# Patient Record
Sex: Female | Born: 1970
Health system: Southern US, Community
[De-identification: ages and names within clinical notes are randomized; demographics above are authoritative.]

## PROBLEM LIST (undated history)

## (undated) DIAGNOSIS — M329 Systemic lupus erythematosus, unspecified: Secondary | ICD-10-CM

## (undated) DIAGNOSIS — E079 Disorder of thyroid, unspecified: Secondary | ICD-10-CM

## (undated) DIAGNOSIS — I10 Essential (primary) hypertension: Secondary | ICD-10-CM

## (undated) DIAGNOSIS — IMO0002 Reserved for concepts with insufficient information to code with codable children: Secondary | ICD-10-CM

## (undated) HISTORY — PX: ABDOMINAL HYSTERECTOMY: SHX81

---

## 2006-12-02 ENCOUNTER — Emergency Department (HOSPITAL_COMMUNITY): Admission: EM | Admit: 2006-12-02 | Discharge: 2006-12-02 | Payer: Self-pay | Admitting: Emergency Medicine

## 2007-05-17 ENCOUNTER — Emergency Department (HOSPITAL_COMMUNITY): Admission: EM | Admit: 2007-05-17 | Discharge: 2007-05-17 | Payer: Self-pay | Admitting: Emergency Medicine

## 2007-05-24 ENCOUNTER — Inpatient Hospital Stay (HOSPITAL_COMMUNITY): Admission: AD | Admit: 2007-05-24 | Discharge: 2007-05-24 | Payer: Self-pay | Admitting: Obstetrics & Gynecology

## 2010-10-20 LAB — URINALYSIS, ROUTINE W REFLEX MICROSCOPIC
Bilirubin Urine: NEGATIVE
Glucose, UA: NEGATIVE
Ketones, ur: NEGATIVE
Nitrite: NEGATIVE
Protein, ur: NEGATIVE
Specific Gravity, Urine: 1.01
Urobilinogen, UA: 0.2
pH: 7

## 2010-10-20 LAB — URINE MICROSCOPIC-ADD ON

## 2010-10-20 LAB — CULTURE, ROUTINE-ABSCESS

## 2010-10-20 LAB — POCT PREGNANCY, URINE
Operator id: 202651
Preg Test, Ur: NEGATIVE

## 2010-11-03 LAB — I-STAT 8, (EC8 V) (CONVERTED LAB)
BUN: 10
Bicarbonate: 25.4 — ABNORMAL HIGH
Chloride: 106
Glucose, Bld: 83
HCT: 42
Hemoglobin: 14.3
Operator id: 116391
Potassium: 4.3
Sodium: 140
TCO2: 27
pCO2, Ven: 44.4 — ABNORMAL LOW
pH, Ven: 7.366 — ABNORMAL HIGH

## 2010-11-03 LAB — POCT URINALYSIS DIP (DEVICE)
Glucose, UA: NEGATIVE
Nitrite: NEGATIVE
Operator id: 116391
Protein, ur: 30 — AB
Specific Gravity, Urine: 1.015
Urobilinogen, UA: 2 — ABNORMAL HIGH
pH: 6

## 2010-11-03 LAB — POCT I-STAT CREATININE
Creatinine, Ser: 0.9
Operator id: 116391

## 2010-11-03 LAB — POCT PREGNANCY, URINE
Operator id: 116391
Preg Test, Ur: NEGATIVE

## 2010-11-03 LAB — TSH: TSH: 1.121

## 2014-11-11 DIAGNOSIS — Z8041 Family history of malignant neoplasm of ovary: Secondary | ICD-10-CM | POA: Insufficient documentation

## 2014-11-11 DIAGNOSIS — N946 Dysmenorrhea, unspecified: Secondary | ICD-10-CM | POA: Insufficient documentation

## 2014-11-28 DIAGNOSIS — Z87891 Personal history of nicotine dependence: Secondary | ICD-10-CM | POA: Insufficient documentation

## 2014-11-28 DIAGNOSIS — Z01811 Encounter for preprocedural respiratory examination: Secondary | ICD-10-CM | POA: Insufficient documentation

## 2015-04-03 DIAGNOSIS — G588 Other specified mononeuropathies: Secondary | ICD-10-CM | POA: Insufficient documentation

## 2015-04-03 DIAGNOSIS — M722 Plantar fascial fibromatosis: Secondary | ICD-10-CM | POA: Insufficient documentation

## 2015-11-27 DIAGNOSIS — M5416 Radiculopathy, lumbar region: Secondary | ICD-10-CM | POA: Insufficient documentation

## 2016-04-09 DIAGNOSIS — M3219 Other organ or system involvement in systemic lupus erythematosus: Secondary | ICD-10-CM | POA: Insufficient documentation

## 2016-08-27 ENCOUNTER — Encounter (HOSPITAL_COMMUNITY): Payer: Self-pay | Admitting: Family Medicine

## 2016-08-27 ENCOUNTER — Ambulatory Visit (HOSPITAL_COMMUNITY)
Admission: EM | Admit: 2016-08-27 | Discharge: 2016-08-27 | Disposition: A | Discharge status: Home / Self Care | Payer: Managed Care, Other (non HMO) | Attending: Emergency Medicine | Admitting: Emergency Medicine

## 2016-08-27 DIAGNOSIS — J0100 Acute maxillary sinusitis, unspecified: Secondary | ICD-10-CM | POA: Diagnosis not present

## 2016-08-27 HISTORY — DX: Reserved for concepts with insufficient information to code with codable children: IMO0002

## 2016-08-27 HISTORY — DX: Essential (primary) hypertension: I10

## 2016-08-27 HISTORY — DX: Systemic lupus erythematosus, unspecified: M32.9

## 2016-08-27 MED ORDER — AMOXICILLIN-POT CLAVULANATE 875-125 MG PO TABS: 1.0000 | ORAL_TABLET | Freq: Two times a day (BID) | ORAL | 0 refills | Status: AC

## 2016-08-27 MED ORDER — LIDOCAINE VISCOUS 2 % MT SOLN: 15.0000 mL | OROMUCOSAL | 0 refills | Status: DC | PRN

## 2016-08-27 NOTE — ED Triage Notes (Signed)
Pt here for sore throat and drainage in the back of her throat. sts x 2 days. sts recent cough.cold.

## 2016-08-27 NOTE — Discharge Instructions (Signed)
Take antibiotics as directed. Lidocaine solution for sore throat. Avoid oral intake for 30 minutes after solution, as it can medication gag reflex. Take over-the-counter Flonase/decongestants to help with nasal congestion. Keep hydrated. Monitor for worsening of symptoms, follow up for reevaluation is needed.

## 2016-08-27 NOTE — ED Provider Notes (Signed)
CSN: [id number]     Arrival date & time 08/27/16  1127 History   None    Chief Complaint  Patient presents with  . Sore Throat   (Consider location/radiation/quality/duration/timing/severity/associated sxs/prior Treatment) 46 year old female with history of hypertension and lupus comes in for 2 day history of sore throat and painful swallowing. Patient states she had symptoms of cold starting a month ago, with nasal congestion and sinus pressure/pain. She had taken over-the-counter medications with some relief. 2 days ago, she started having a sore throat and states it is very painful to swallow, and extends down her neck. Denies fever, chills, night sweats. Denies chest pain, trouble breathing, swelling of the throat. Denies ear pain, eye pain. Denies abdominal pain, nausea, vomiting, diarrhea. She recently moved back to [place], is in the process of finding a PCP.      Past Medical History:  Diagnosis Date  . Hypertension   . Lupus    History reviewed. No pertinent surgical history. History reviewed. No pertinent family history. Social History  Substance Use Topics  . Smoking status: Not on file  . Smokeless tobacco: Not on file  . Alcohol use Not on file   OB History    No data available     Review of Systems  Reason unable to perform ROS: See HPI as above.    Allergies  Patient has no known allergies.  Home Medications   Prior to Admission medications   Medication Sig Start Date End Date Taking? Authorizing Provider  atenolol (TENORMIN) 100 MG tablet Take 100 mg by mouth daily.   Yes [provider]  folic acid (FOLVITE) 1 MG tablet Take 3 mg by mouth daily.   Yes [provider]  hydroxychloroquine (PLAQUENIL) 200 MG tablet Take 400 mg by mouth daily.   Yes [provider]  Ixekizumab 80 MG/ML SOAJ Inject 80 mg into the skin every 14 (fourteen) days.   Yes [provider]  lisinopril (PRINIVIL,ZESTRIL) 10 MG tablet Take 10 mg by  mouth daily.   Yes [provider]  nebivolol (BYSTOLIC) 10 MG tablet Take 10 mg by mouth daily.   Yes [provider]  predniSONE (DELTASONE) 5 MG tablet Take 5 mg by mouth 3 (three) times daily.   Yes [provider]  pregabalin (LYRICA) 100 MG capsule Take 300 mg by mouth at bedtime as needed.   Yes [provider]  amoxicillin-clavulanate (AUGMENTIN) 875-125 MG tablet Take 1 tablet by mouth every 12 (twelve) hours. 08/27/16 09/06/16  Cathie Hoops, Esaul Dorwart V, PA-C  lidocaine (XYLOCAINE) 2 % solution Use as directed 15 mLs in the mouth or throat as needed for mouth pain. 08/27/16   Belinda Fisher, PA-C   Meds Ordered and Administered this Visit  Medications - No data to display  BP (!) 158/107   Pulse 98   Temp 98.4 F (36.9 C)   Resp 18   SpO2 100%  No data found.   Physical Exam  Constitutional: She is oriented to person, place, and time. She appears well-developed and well-nourished. No distress.  HENT:  Head: Normocephalic and atraumatic.  Right Ear: Tympanic membrane, external ear and ear canal normal. Tympanic membrane is not erythematous and not bulging.  Left Ear: Tympanic membrane, external ear and ear canal normal. Tympanic membrane is not erythematous and not bulging.  Nose: Right sinus exhibits maxillary sinus tenderness. Right sinus exhibits no frontal sinus tenderness. Left sinus exhibits maxillary sinus tenderness. Left sinus exhibits no frontal sinus tenderness.  Mouth/Throat: Uvula is midline and mucous membranes are normal. Posterior oropharyngeal erythema present. No oropharyngeal exudate or posterior oropharyngeal edema. Tonsils are 1+ on the right. Tonsils are 1+ on the left. No tonsillar exudate.  Eyes: Pupils are equal, round, and reactive to light. Conjunctivae are normal.  Neck: Normal range of motion. Neck supple.  Cardiovascular: Normal rate, regular rhythm and normal heart sounds.  Exam reveals no gallop and no friction rub.   No murmur  heard. Pulmonary/Chest: Effort normal and breath sounds normal. She has no decreased breath sounds. She has no wheezes. She has no rhonchi. She has no rales.  Lymphadenopathy:    She has no cervical adenopathy.  Neurological: She is alert and oriented to person, place, and time.  Skin: Skin is warm and dry.  Psychiatric: She has a normal mood and affect. Her behavior is normal. Judgment normal.    Urgent Care Course     Procedures (including critical care time)  Labs Review Labs Reviewed - No data to display  Imaging Review No results found.       MDM   1. Acute non-recurrent maxillary sinusitis    Discussed with patient history and exam most consistent with sinusitis. Given patient without exudates seen on exam, no fever, deferred rapid strep today. Given patient with sinus pressure/pain for 3-4 weeks, will treat with Augmentin. Lidocaine solution for sore throat, precautions given. Patient to continue over-the-counter nasal spray, decongestions to help with nasal congestion. Push fluids. Monitor for worsening of symptoms, trouble breathing, trouble swallowing, swelling of the throat, to go to the ED for further evaluation.    Belinda Fisher, PA-C 08/27/16 1429

## 2016-09-22 ENCOUNTER — Ambulatory Visit: Payer: Managed Care, Other (non HMO) | Admitting: Physician Assistant

## 2016-09-24 ENCOUNTER — Ambulatory Visit: Payer: Managed Care, Other (non HMO) | Admitting: Physician Assistant

## 2016-09-29 ENCOUNTER — Other Ambulatory Visit: Payer: Self-pay | Admitting: Rheumatology

## 2016-09-29 DIAGNOSIS — E049 Nontoxic goiter, unspecified: Secondary | ICD-10-CM

## 2016-10-07 ENCOUNTER — Ambulatory Visit
Admission: RE | Admit: 2016-10-07 | Discharge: 2016-10-07 | Disposition: A | Discharge status: Home / Self Care | Payer: Managed Care, Other (non HMO) | Source: Ambulatory Visit | Attending: Rheumatology | Admitting: Rheumatology

## 2016-10-07 DIAGNOSIS — E049 Nontoxic goiter, unspecified: Secondary | ICD-10-CM

## 2016-10-08 ENCOUNTER — Other Ambulatory Visit: Payer: Managed Care, Other (non HMO)

## 2016-10-12 ENCOUNTER — Other Ambulatory Visit: Payer: Self-pay | Admitting: Rheumatology

## 2016-10-12 DIAGNOSIS — E041 Nontoxic single thyroid nodule: Secondary | ICD-10-CM

## 2016-10-19 ENCOUNTER — Emergency Department (HOSPITAL_COMMUNITY)
Admission: EM | Admit: 2016-10-19 | Discharge: 2016-10-19 | Disposition: A | Discharge status: Home / Self Care | Payer: Managed Care, Other (non HMO) | Attending: Emergency Medicine | Admitting: Emergency Medicine

## 2016-10-19 ENCOUNTER — Encounter: Payer: Self-pay | Admitting: Emergency Medicine

## 2016-10-19 DIAGNOSIS — Z5321 Procedure and treatment not carried out due to patient leaving prior to being seen by health care provider: Secondary | ICD-10-CM | POA: Diagnosis not present

## 2016-10-19 DIAGNOSIS — J029 Acute pharyngitis, unspecified: Secondary | ICD-10-CM | POA: Diagnosis present

## 2016-10-19 LAB — BASIC METABOLIC PANEL
Anion gap: 6 (ref 5–15)
BUN: 10 mg/dL (ref 6–20)
CO2: 28 mmol/L (ref 22–32)
Calcium: 8.8 mg/dL — ABNORMAL LOW (ref 8.9–10.3)
Chloride: 106 mmol/L (ref 101–111)
Creatinine, Ser: 0.92 mg/dL (ref 0.44–1.00)
GFR calc Af Amer: >60 mL/min (ref >60)
GFR calc non Af Amer: >60 mL/min (ref >60)
Glucose, Bld: 122 mg/dL — ABNORMAL HIGH (ref 65–99)
Potassium: 2.9 mmol/L — ABNORMAL LOW (ref 3.5–5.1)
Sodium: 140 mmol/L (ref 135–145)

## 2016-10-19 LAB — CBC
HCT: 38.4 % (ref 36.0–46.0)
Hemoglobin: 12.5 g/dL (ref 12.0–15.0)
MCH: 27.7 pg (ref 26.0–34.0)
MCHC: 32.6 g/dL (ref 30.0–36.0)
MCV: 85 fL (ref 78.0–100.0)
Platelets: 217 10*3/uL (ref 150–400)
RBC: 4.52 MIL/uL (ref 3.87–5.11)
RDW: 15.4 % (ref 11.5–15.5)
WBC: 11.3 10*3/uL — ABNORMAL HIGH (ref 4.0–10.5)

## 2016-10-19 NOTE — ED Notes (Signed)
Pt came to nurses station stating she did not want to wait. Informed pt of the longest wait. Pt continued to state she did not want to wait.

## 2016-10-19 NOTE — ED Triage Notes (Signed)
Pt reports known thyroid nodule for the last 2 years. Pt states over the weekend began having throat pain, difficulty swallowing. Pt awake, alert, appropriate. Pt states she has an appt with the surgeon next week. Here for pain control.

## 2016-10-20 ENCOUNTER — Inpatient Hospital Stay (HOSPITAL_COMMUNITY)
Admission: AD | Admit: 2016-10-20 | Discharge: 2016-10-20 | Disposition: A | Discharge status: Home / Self Care | Payer: Managed Care, Other (non HMO) | Source: Ambulatory Visit | Attending: Obstetrics & Gynecology | Admitting: Obstetrics & Gynecology

## 2016-10-20 DIAGNOSIS — E041 Nontoxic single thyroid nodule: Secondary | ICD-10-CM | POA: Diagnosis present

## 2016-10-20 DIAGNOSIS — R1310 Dysphagia, unspecified: Secondary | ICD-10-CM | POA: Insufficient documentation

## 2016-10-20 NOTE — MAU Provider Note (Signed)
Chief Complaint: Thyroid Nodule   SUBJECTIVE HPI: Denise Carter is a 46 y.o. who presents to MAU with complaints about her thyroid. Patient states that she feels like her thyroid nodules are getting bigger and causing her to have trouble swallowing and drinking. She follows with an endocrinologist. She tried calling the office today but no one called back. States that she has a visit with the surgeon on Monday but felt like she couldn't wait until then. Patient denies fevers, chills, dyspnea, chest pain. She is currently on a beta-blocker and steroid for her symptoms. Denies any gynecological concerns. Denies being pregnant.   Past Medical History:  Diagnosis Date  . Hypertension   . Lupus    OB History  No data available   No past surgical history on file. Social History   Social History  . Marital status: Married    Spouse name: N/A  . Number of children: N/A  . Years of education: N/A   Occupational History  . Not on file.   Social History Main Topics  . Smoking status: Not on file  . Smokeless tobacco: Not on file  . Alcohol use Not on file  . Drug use: Unknown  . Sexual activity: Not on file   Other Topics Concern  . Not on file   Social History Narrative  . No narrative on file   No current facility-administered medications on file prior to encounter.    Current Outpatient Prescriptions on File Prior to Encounter  Medication Sig Dispense Refill  . atenolol (TENORMIN) 100 MG tablet Take 100 mg by mouth daily.    . folic acid (FOLVITE) 1 MG tablet Take 3 mg by mouth daily.    . hydroxychloroquine (PLAQUENIL) 200 MG tablet Take 400 mg by mouth daily.    . Ixekizumab 80 MG/ML SOAJ Inject 80 mg into the skin every 14 (fourteen) days.    Marland Kitchen lidocaine (XYLOCAINE) 2 % solution Use as directed 15 mLs in the mouth or throat as needed for mouth pain. 100 mL 0  . lisinopril (PRINIVIL,ZESTRIL) 10 MG tablet Take 10 mg by mouth daily.    . nebivolol (BYSTOLIC) 10 MG  tablet Take 10 mg by mouth daily.    . predniSONE (DELTASONE) 5 MG tablet Take 5 mg by mouth 3 (three) times daily.    . pregabalin (LYRICA) 100 MG capsule Take 300 mg by mouth at bedtime as needed.     No Known Allergies  I have reviewed the past Medical Hx, Surgical Hx, Social Hx, Allergies and Medications.   REVIEW OF SYSTEMS All systems reviewed and are negative for acute change except as noted in the HPI.   OBJECTIVE BP (!) 159/99 (BP Location: Right Arm)   Pulse (!) 108   Temp (!) 103.1 F (39.5 C) (Oral)   Resp 18   Ht 5\' 4"  (1.626 m)   Wt 71.6 kg (157 lb 12 oz)   SpO2 100%   BMI 27.08 kg/m    PHYSICAL EXAM Constitutional: Well-developed, well-nourished female in no acute distress.  HEENT: [place]/AT, o/p clear no obstruction visualized, EOMI, neck is supple, normal ROM, small palpable 1cm nodule on left side, no adenopathy Cardiovascular: normal rhythm, tachycardia, pulses intact Respiratory: normal rate and effort.  GI: Abd soft, non-tender, non-distended. MS: Extremities nontender, no edema, normal ROM Neurologic: Alert and oriented x 4. No focal deficits   LAB RESULTS No results found for this or any previous visit (from the past 24 hour(s)).  IMAGING  US Thyroid  Result Date: 10/07/2016 CLINICAL DATA:  Thyromegaly on exam EXAM: THYROID ULTRASOUND TECHNIQUE: Ultrasound examination of the thyroid gland and adjacent soft tissues was performed. COMPARISON:  None. FINDINGS: Parenchymal Echotexture: Normal Isthmus: 2 mm Right lobe: 4.7 x 1.5 x 2.4 cm Left lobe: 6.5 x 1.7 x 2.6 cm _________________________________________________________ Estimated total number of nodules >/= 1 cm: 1 Number of spongiform nodules >/=  2 cm not described below (TR1): 0 Number of mixed cystic and solid nodules >/= 1.5 cm not described below (TR2): 0 _________________________________________________________ Nodule # 1: Location: Left; Inferior Maximum size: 1.5 cm; Other 2 dimensions: 1.5 x 1.2 cm  Composition: solid/almost completely solid (2) Echogenicity: isoechoic (1) Shape: not taller-than-wide (0) Margins: ill-defined (0) Echogenic foci: none (0) ACR TI-RADS total points: 3. ACR TI-RADS risk category: TR3 (3 points). ACR TI-RADS recommendations: *Given size (>/= 1.5 - 2.4 cm) and appearance, a follow-up ultrasound in 1 year should be considered based on TI-RADS criteria. _________________________________________________________ Additional bilateral cystic nodules noted which are not fully characterized by TI RADS criteria. These would not meet criteria for any follow-up imaging or biopsy. No adenopathy. IMPRESSION: 1.5 cm left inferior TR 3 nodule meets criteria for follow-up in 1 year. The above is in keeping with the ACR TI-RADS recommendations - J Am Coll Radiol 2017;14:587-595. Electronically Signed   By: Judie Petit.  Shick M.D.   On: 10/07/2016 15:32    MAU COURSE Vitals and nursing notes reviewed PE unremarkable no signs of obstruction  MDM Plan of care reviewed with patient, including labs and tests ordered and medical treatment.   ASSESSMENT 1. Dysphagia, unspecified type   2. Thyroid nodule     PLAN Discharge home in stable condition Patient encouraged to follow-up with endocrinologist and keep surgical appointment Discussed purpose of MAU  Counseled on return precautions and to seek help at one of the EDs Handout given   Caryl Ada, DO OB Fellow Faculty [facility], [facility] 10/20/2016, 10:12 PM

## 2016-10-20 NOTE — Discharge Instructions (Signed)
Follow-up with your surgeon and endocrinologist  Thyroid Nodule A thyroid nodule is an isolatedgrowth of thyroid cells that forms a lump in your thyroid gland. The thyroid gland is a butterfly-shaped gland. It is found in the lower front of your neck. This gland sends chemical messengers (hormones) through your blood to all parts of your body. These hormones are important in regulating your body temperature and helping your body to use energy. Thyroid nodules are common. Most are not cancerous (are benign). You may have one nodule or several nodules. Different types of thyroid nodules include:  Nodules that grow and fill with fluid (thyroid cysts).  Nodules that produce too much thyroid hormone (hot nodules or hyperthyroid).  Nodules that produce no thyroid hormone (cold nodules or hypothyroid).  Nodules that form from cancer cells (thyroid cancers).  What are the causes? Usually, the cause of this condition is not known. What increases the risk? Factors that make this condition more likely to develop include:  Increasing age. Thyroid nodules become more common in people who are older than 47 years of age.  Gender. ? Benign thyroid nodules are more common in women. ? Cancerous (malignant) thyroid nodules are more common in men.  A family history that includes: ? Thyroid nodules. ? Pheochromocytoma. ? Thyroid carcinoma. ? Hyperparathyroidism.  Certain kinds of thyroid diseases, such as Hashimoto thyroiditis.  Lack of iodine.  A history of head and neck radiation, such as from X-rays.  What are the signs or symptoms? It is common for this condition to cause no symptoms. If you have symptoms, they may include:  A lump in your lower neck.  Feeling a lump or tickle in your throat.  Pain in your neck, jaw, or ear.  Having trouble swallowing.  Hot nodules may cause symptoms that include:  Weight loss.  Warm, flushed skin.  Feeling hot.  Feeling nervous.  A racing  heartbeat.  Cold nodules may cause symptoms that include:  Weight gain.  Dry skin.  Brittle hair. This may also occur with hair loss.  Feeling cold.  Fatigue.  Thyroid cancer nodules may cause symptoms that include:  Hard nodules that feel stuck to the thyroid gland.  Hoarseness.  Lumps in the glands near your thyroid (lymph nodes).  How is this diagnosed? A thyroid nodule may be felt by your health care provider during a physical exam. This condition may also be diagnosed based on your symptoms. You may also have tests, including:  An ultrasound. This may be done to confirm the diagnosis.  A biopsy. This involves taking a sample from the nodule and looking at it under a microscope to see if the nodule is benign.  Blood tests to make sure that your thyroid is working properly.  Imaging tests such as MRI or CT scan may be done if: ? Your nodule is large. ? Your nodule is blocking your airway. ? Cancer is suspected.  How is this treated? Treatment depends on the cause and size of your nodule or nodules. If the nodule is benign, treatment may not be necessary. Your health care provider may monitor the nodule to see if it goes away without treatment. If the nodule continues to grow, is cancerous, or does not go away:  It may need to be drained with a needle.  It may need to be removed with surgery.  If you have surgery, part or all of your thyroid gland may need to be removed as well. Follow these instructions at home:  Pay attention to any changes in your nodule.  Take over-the-counter and prescription medicines only as told by your health care provider.  Keep all follow-up visits as told by your health care provider. This is important. Contact a health care provider if:  Your voice changes.  You have trouble swallowing.  You have pain in your neck, ear, or jaw that is getting worse.  Your nodule gets bigger.  Your nodule starts to make it harder for you to  breathe. Get help right away if:  You have a sudden fever.  You feel very weak.  Your muscles look like they are shrinking (muscle wasting).  You have mood swings.  You feel very restless.  You feel confused.  You are seeing or hearing things that other people do not see or hear (having hallucinations).  You feel suddenly nauseous or throw up.  You suddenly have diarrhea.  You have chest pain.  There is a loss of consciousness. This information is not intended to replace advice given to you by your health care provider. Make sure you discuss any questions you have with your health care provider. Document Released: 12/05/2003 Document Revised: 09/14/2015 Document Reviewed: 04/24/2014 Elsevier Interactive Patient Education  2018 [employer].

## 2016-10-20 NOTE — MAU Note (Signed)
Patient presents to MAU with c/o thyroid nodules and inflammation. Was seen at [facility] yesterday and left AMA. Diagnosed with thyroid nodules on left side in 2012. Recently moved here and has not seen anyone about the issue since. Patient has hx of hypertension- on medication.

## 2016-10-22 ENCOUNTER — Emergency Department (HOSPITAL_COMMUNITY): Payer: Managed Care, Other (non HMO)

## 2016-10-22 ENCOUNTER — Emergency Department (HOSPITAL_COMMUNITY)
Admission: EM | Admit: 2016-10-22 | Discharge: 2016-10-22 | Disposition: A | Discharge status: Home / Self Care | Payer: Managed Care, Other (non HMO) | Attending: Emergency Medicine | Admitting: Emergency Medicine

## 2016-10-22 ENCOUNTER — Encounter (HOSPITAL_COMMUNITY): Payer: Self-pay | Admitting: *Deleted

## 2016-10-22 DIAGNOSIS — M3210 Systemic lupus erythematosus, organ or system involvement unspecified: Secondary | ICD-10-CM | POA: Diagnosis not present

## 2016-10-22 DIAGNOSIS — R1310 Dysphagia, unspecified: Secondary | ICD-10-CM | POA: Insufficient documentation

## 2016-10-22 DIAGNOSIS — E041 Nontoxic single thyroid nodule: Secondary | ICD-10-CM | POA: Diagnosis not present

## 2016-10-22 DIAGNOSIS — I10 Essential (primary) hypertension: Secondary | ICD-10-CM | POA: Insufficient documentation

## 2016-10-22 DIAGNOSIS — L930 Discoid lupus erythematosus: Secondary | ICD-10-CM

## 2016-10-22 DIAGNOSIS — F17200 Nicotine dependence, unspecified, uncomplicated: Secondary | ICD-10-CM | POA: Diagnosis not present

## 2016-10-22 DIAGNOSIS — Z79899 Other long term (current) drug therapy: Secondary | ICD-10-CM | POA: Insufficient documentation

## 2016-10-22 DIAGNOSIS — N3090 Cystitis, unspecified without hematuria: Secondary | ICD-10-CM | POA: Diagnosis not present

## 2016-10-22 DIAGNOSIS — N3000 Acute cystitis without hematuria: Secondary | ICD-10-CM

## 2016-10-22 DIAGNOSIS — E042 Nontoxic multinodular goiter: Secondary | ICD-10-CM

## 2016-10-22 DIAGNOSIS — R070 Pain in throat: Secondary | ICD-10-CM | POA: Diagnosis present

## 2016-10-22 HISTORY — DX: Disorder of thyroid, unspecified: E07.9

## 2016-10-22 LAB — BASIC METABOLIC PANEL
Anion gap: 9 (ref 5–15)
BUN: 10 mg/dL (ref 6–20)
CO2: 27 mmol/L (ref 22–32)
Calcium: 8.4 mg/dL — ABNORMAL LOW (ref 8.9–10.3)
Chloride: 100 mmol/L — ABNORMAL LOW (ref 101–111)
Creatinine, Ser: 1.09 mg/dL — ABNORMAL HIGH (ref 0.44–1.00)
GFR calc Af Amer: >60 mL/min (ref >60)
GFR calc non Af Amer: 59 mL/min — ABNORMAL LOW (ref >60)
Glucose, Bld: 100 mg/dL — ABNORMAL HIGH (ref 65–99)
Potassium: 2.8 mmol/L — ABNORMAL LOW (ref 3.5–5.1)
Sodium: 136 mmol/L (ref 135–145)

## 2016-10-22 LAB — URINALYSIS, ROUTINE W REFLEX MICROSCOPIC
Bilirubin Urine: NEGATIVE
Glucose, UA: NEGATIVE mg/dL
Ketones, ur: NEGATIVE mg/dL
Nitrite: NEGATIVE
Protein, ur: 100 mg/dL — AB
Specific Gravity, Urine: 1.031 — ABNORMAL HIGH (ref 1.005–1.030)
pH: 5 (ref 5.0–8.0)

## 2016-10-22 LAB — T4, FREE: Free T4: 1.04 ng/dL (ref 0.61–1.12)

## 2016-10-22 LAB — TSH: TSH: 0.797 u[IU]/mL (ref 0.350–4.500)

## 2016-10-22 MED ORDER — DEXTROSE 5 % IV SOLN
1.0000 g | Freq: Once | INTRAVENOUS | Status: AC
  Administered 2016-10-22: 1 g via INTRAVENOUS
  Filled 2016-10-22: qty 10

## 2016-10-22 MED ORDER — OMEPRAZOLE 20 MG PO CPDR: 20.0000 mg | DELAYED_RELEASE_CAPSULE | Freq: Every day | ORAL | 0 refills | Status: DC

## 2016-10-22 MED ORDER — SUCRALFATE 1 GM/10ML PO SUSP: 1.0000 g | Freq: Three times a day (TID) | ORAL | 0 refills | Status: DC

## 2016-10-22 MED ORDER — POTASSIUM CHLORIDE CRYS ER 20 MEQ PO TBCR
40.0000 meq | EXTENDED_RELEASE_TABLET | Freq: Once | ORAL | Status: AC
  Administered 2016-10-22: 40 meq via ORAL
  Filled 2016-10-22: qty 2

## 2016-10-22 MED ORDER — HYDROCODONE-ACETAMINOPHEN 7.5-325 MG/15ML PO SOLN
10.0000 mL | Freq: Once | ORAL | Status: AC
  Administered 2016-10-22: 10 mL via ORAL
  Filled 2016-10-22: qty 15

## 2016-10-22 MED ORDER — HYDROCODONE-ACETAMINOPHEN 5-325 MG PO TABS: 1.0000 | ORAL_TABLET | ORAL | 0 refills | Status: DC | PRN

## 2016-10-22 MED ORDER — IOPAMIDOL (ISOVUE-300) INJECTION 61%
INTRAVENOUS | Status: AC
  Filled 2016-10-22: qty 75

## 2016-10-22 MED ORDER — CEPHALEXIN 500 MG PO CAPS: ORAL_CAPSULE | ORAL | 0 refills | Status: DC

## 2016-10-22 NOTE — ED Provider Notes (Signed)
[facility]-EMERGENCY DEPT Provider Note   CSN: [id number] Arrival date & time: 10/22/16  2355     History   Chief Complaint Chief Complaint  Patient presents with  . Back Pain  . Dysphagia    HPI Denise Carter is a 46 y.o. female.  HPI Patient reports she's been having problems with throat pain and dysphagia for a couple months. She reports it persists or worsens. She does report she has some known thyroid nodules which are undergoing diagnostic evaluation currently. She has follow-up with surgery in approximately 1 week regarding the thyroid nodules. In the meantime she reports she continues to have worsening pain with swallowing and burning discomfort in her throat. She does not had difficulty breathing. She reports she has been treated with a course of antibiotics without improvement. Patient has known lupus and is on Remicade. She reports that she has chronic problems with pain due to peripheral neuropathy. This is not new. She reports she is currently taking prednisone to try to "shrink the nodules in her neck". She is taking 10 mg per day. Patient reports she had an upper endoscopy about 4 years ago that showed some polyps in her esophagus or stomach. He denies she takes any PPI or acid medications. Past Medical History:  Diagnosis Date  . Hypertension   . Lupus   . Thyroid disease     There are no active problems to display for this patient.   Past Surgical History:  Procedure Laterality Date  . ABDOMINAL HYSTERECTOMY      OB History    No data available       Home Medications    Prior to Admission medications   Medication Sig Start Date End Date Taking? Authorizing Provider  folic acid (FOLVITE) 1 MG tablet Take 3 mg by mouth daily.   Yes [provider]  hydroxychloroquine (PLAQUENIL) 200 MG tablet Take 400 mg by mouth daily.   Yes [provider]  inFLIXimab (REMICADE) 100 MG injection Inject 500 mg into the vein every 30 (thirty) days.   Yes  [provider]  lisinopril (PRINIVIL,ZESTRIL) 5 MG tablet Take 5 mg by mouth daily.   Yes [provider]  methotrexate (RHEUMATREX) 10 MG tablet Take 10 mg by mouth once a week. Caution: Chemotherapy. Protect from light.   Yes [provider]  naproxen sodium (ANAPROX) 220 MG tablet Take 880 mg by mouth at bedtime.   Yes [provider]  nebivolol (BYSTOLIC) 10 MG tablet Take 10 mg by mouth daily.   Yes [provider]  predniSONE (DELTASONE) 5 MG tablet Take 10 mg by mouth daily with breakfast.    Yes [provider]  pregabalin (LYRICA) 100 MG capsule Take 300 mg by mouth at bedtime.    Yes [provider]  verapamil (CALAN-SR) 180 MG CR tablet Take 180 mg by mouth at bedtime.   Yes [provider]  cephALEXin (KEFLEX) 500 MG capsule 2 caps po bid x 7 days 10/22/16   Arby Barrette, MD  HYDROcodone-acetaminophen (NORCO/VICODIN) 5-325 MG tablet Take 1-2 tablets by mouth every 4 (four) hours as needed for moderate pain or severe pain. 10/22/16   Arby Barrette, MD  lidocaine (XYLOCAINE) 2 % solution Use as directed 15 mLs in the mouth or throat as needed for mouth pain. Patient not taking: Reported on 10/22/2016 08/27/16   Belinda Fisher, PA-C  omeprazole (PRILOSEC) 20 MG capsule Take 1 capsule (20 mg total) by mouth daily. 10/22/16  Arby Barrette, MD  sucralfate (CARAFATE) 1 GM/10ML suspension Take 10 mLs (1 g total) by mouth 4 (four) times daily -  with meals and at bedtime. 10/22/16   Arby Barrette, MD    Family History No family history on file.  Social History Social History  Substance Use Topics  . Smoking status: Current Every Day Smoker  . Smokeless tobacco: Never Used  . Alcohol use No     Allergies   Patient has no known allergies.   Review of Systems Review of Systems 10 Systems reviewed and are negative for acute change except as noted in the HPI.   Physical Exam Updated Vital Signs BP (!) 168/109    Pulse (!) 112   Temp (!) 100.7 F (38.2 C) (Oral)   Resp 17   Ht 5\' 4"  (1.626 m)   Wt 71.2 kg (157 lb)   SpO2 98%   BMI 26.95 kg/m   Physical Exam  Constitutional: She is oriented to person, place, and time. She appears well-developed and well-nourished. No distress.  HENT:  Head: Normocephalic and atraumatic.  Right Ear: External ear normal.  Left Ear: External ear normal.  Nose: Nose normal.  Mouth/Throat: Oropharynx is clear and moist.  ST oropharynx is widely patent. No suspicious appearing lesions of the tonsils or palate.  Eyes: Conjunctivae and EOM are normal.  Neck: Neck supple.  Neck is supple. Right thyroid has a approximately 1 cm palpable nodule which the patient endorses as being tender. No erythema or swelling of the neck. No other suspicious lymphadenopathy. No stridor.  Cardiovascular: Normal rate, regular rhythm, normal heart sounds and intact distal pulses.   No murmur heard. Pulmonary/Chest: Effort normal and breath sounds normal. No respiratory distress.  Abdominal: Soft. She exhibits no distension. There is no tenderness. There is no guarding.  Musculoskeletal: Normal range of motion. She exhibits no edema or tenderness.  Neurological: She is alert and oriented to person, place, and time. No cranial nerve deficit. She exhibits normal muscle tone. Coordination normal.  Skin: Skin is warm and dry. No rash noted.  Psychiatric: She has a normal mood and affect.  Nursing note and vitals reviewed.    ED Treatments / Results  Labs (all labs ordered are listed, but only abnormal results are displayed) Labs Reviewed  BASIC METABOLIC PANEL - Abnormal; Notable for the following:       Result Value   Potassium 2.8 (*)    Chloride 100 (*)    Glucose, Bld 100 (*)    Creatinine, Ser 1.09 (*)    Calcium 8.4 (*)    GFR calc non Af Amer 59 (*)    All other components within normal limits  URINALYSIS, ROUTINE W REFLEX MICROSCOPIC - Abnormal; Notable for the  following:    Color, Urine AMBER (*)    APPearance HAZY (*)    Specific Gravity, Urine 1.031 (*)    Hgb urine dipstick MODERATE (*)    Protein, ur 100 (*)    Leukocytes, UA MODERATE (*)    Bacteria, UA MANY (*)    Squamous Epithelial / LPF 0-5 (*)    All other components within normal limits  TSH  T4, FREE    EKG  EKG Interpretation None       Radiology Ct Soft Tissue Neck W Contrast  Result Date: 10/22/2016 CLINICAL DATA:  Dysphagia and pain this weekend. On steroids for symptoms. History of large thyroid nodules and lupus. EXAM: CT NECK WITH CONTRAST TECHNIQUE: Multidetector CT  imaging of the neck was performed using the standard protocol following the bolus administration of intravenous contrast. CONTRAST:  75 cc Isovue 370 COMPARISON:  Thyroid ultrasound October 07, 2016 FINDINGS: PHARYNX AND LARYNX: Thickened uvula, potentially artifact. Patent airway. Normal larynx. Preservation of parapharyngeal fat planes. SALIVARY GLANDS: Normal. THYROID: 14 mm LEFT thyroid nodule, corresponding to sonographic finding October 07, 2016. No additional nodules. Thyroid size is normal by CT. LYMPH NODES: No lymphadenopathy by CT size criteria. VASCULAR: Normal. LIMITED INTRACRANIAL: Normal. VISUALIZED ORBITS: Normal. MASTOIDS AND VISUALIZED PARANASAL SINUSES: Mild RIGHT greater than LEFT maxillary sinus mucosal thickening without air-fluid levels. Mastoid air cells are well aerated. SKELETON: Nonacute. Dental malocclusion. Multiple absent teeth, caries within residual maxillary teeth. UPPER CHEST: Lung apices are clear. No superior mediastinal lymphadenopathy. OTHER: None. IMPRESSION: 1. Mildly thickened uvula may be artifact.  Patent airway. 2. 14 mm LEFT thyroid nodule recently evaluated with ultrasound. Electronically Signed   By: Awilda Metro M.D.   On: 10/22/2016 14:41    Procedures Procedures (including critical care time)  Medications Ordered in ED Medications  iopamidol  (ISOVUE-300) 61 % injection (not administered)  cefTRIAXone (ROCEPHIN) 1 g in dextrose 5 % 50 mL IVPB (1 g Intravenous New Bag/Given 10/22/16 1611)  potassium chloride SA (K-DUR,KLOR-CON) CR tablet 40 mEq (40 mEq Oral Given 10/22/16 1610)  HYDROcodone-acetaminophen (HYCET) 7.5-325 mg/15 ml solution 10 mL (10 mLs Oral Given 10/22/16 1609)     Initial Impression / Assessment and Plan / ED Course  I have reviewed the triage vital signs and the nursing notes.  Pertinent labs & imaging results that were available during my care of the patient were reviewed by me and considered in my medical decision making (see chart for details).      Final Clinical Impressions(s) / ED Diagnoses   Final diagnoses:  Acute cystitis without hematuria  Dysphagia, unspecified type  Multiple thyroid nodules  Lupus erythematosus, unspecified form  Patient's main presenting complaint is feeling of burning in her neck and compression from her thyroid nodules. As this is making it hard for her to swallow and is painful. CT scan does not show any other soft tissue anomaly. No other structural compressive masses. On physical examination airway is widely patent and there is no stridor. I discussed possibility of GERD being the cause of the patient's symptoms. Possibly she is getting reflux and getting feeling of burning in the throat and discomfort with swallowing. She'll be empirically started on Prilosec. So will do a short course of Carafate. Patient was low-grade febrile in the emergency department. No other other positives on review systems. Urinalysis however is grossly positive. Will opt to treat this. Patient is nontoxic and otherwise clinically well and appearance. She does however have significant risk factors of immunosuppression with lupus on immune suppressing medications. Patient is already established with a rheumatologist and has surgical follow-up for her thyroid nodules. She is getting established with a PCP and  she is advised to follow-up with one as soon as possible.  New Prescriptions New Prescriptions   CEPHALEXIN (KEFLEX) 500 MG CAPSULE    2 caps po bid x 7 days   HYDROCODONE-ACETAMINOPHEN (NORCO/VICODIN) 5-325 MG TABLET    Take 1-2 tablets by mouth every 4 (four) hours as needed for moderate pain or severe pain.   OMEPRAZOLE (PRILOSEC) 20 MG CAPSULE    Take 1 capsule (20 mg total) by mouth daily.   SUCRALFATE (CARAFATE) 1 GM/10ML SUSPENSION    Take 10 mLs (1 g total)  by mouth 4 (four) times daily -  with meals and at bedtime.     Arby Barrette, MD 10/22/16 [phone]

## 2016-10-22 NOTE — ED Triage Notes (Signed)
Pt states she has left thyroid module and it has spread to right side and has affected her swallowing.  Pt has ankle to knee pain for increased neuropathy pain. Pt complains of back pain and has known bulging disc.

## 2016-10-24 LAB — URINE CULTURE: Culture: >=100000 — AB

## 2016-10-25 ENCOUNTER — Telehealth: Payer: Self-pay | Admitting: Emergency Medicine

## 2016-10-25 NOTE — Telephone Encounter (Signed)
Post ED Visit - Positive Culture Follow-up  Culture report reviewed by antimicrobial stewardship pharmacist:  []  , Pharm.D. [x]  Enzo Bi, Pharm.D., BCPS AQ-ID []  , Pharm.D., BCPS []  Celedonio Miyamoto, .D., BCPS []  [place], .D., BCPS, AAHIVP []  Georgina Pillion, Pharm.D., BCPS, AAHIVP []  [address], PharmD, BCPS []  , PharmD, BCPS []  [place], PharmD, BCPS  Positive urine culture Treated with cephalexin, organism sensitive to the same and no further patient follow-up is required at this time.  [address] 10/25/2016, 12:18 PM

## 2016-12-27 ENCOUNTER — Telehealth: Payer: Self-pay | Admitting: Hematology

## 2016-12-27 ENCOUNTER — Encounter: Payer: Self-pay | Admitting: Hematology

## 2016-12-27 NOTE — Telephone Encounter (Signed)
Appt has been scheduled for the pt to see Dr.Kale on 12/20 at 10am. Pt aware to arrive 30 minutes early. Letter mailed to the pt and faxed to the referring.

## 2016-12-28 ENCOUNTER — Telehealth: Payer: Self-pay | Admitting: Hematology and Oncology

## 2016-12-28 NOTE — Telephone Encounter (Signed)
Pt cld wanting an earlier appt. Appt has been rescheduled for the pt to see Dr. Gweneth Dimitri on 12/7 at 11am. Pt aware to arrive 30 minutes early.

## 2016-12-31 ENCOUNTER — Encounter: Payer: Self-pay | Admitting: Hematology and Oncology

## 2016-12-31 ENCOUNTER — Ambulatory Visit (HOSPITAL_BASED_OUTPATIENT_CLINIC_OR_DEPARTMENT_OTHER): Payer: Managed Care, Other (non HMO) | Admitting: Hematology and Oncology

## 2016-12-31 VITALS — BP 175/114 | HR 79 | Temp 98.7°F | Resp 18 | Ht 64.0 in | Wt 149.4 lb

## 2016-12-31 DIAGNOSIS — M329 Systemic lupus erythematosus, unspecified: Secondary | ICD-10-CM | POA: Diagnosis not present

## 2016-12-31 DIAGNOSIS — M069 Rheumatoid arthritis, unspecified: Secondary | ICD-10-CM | POA: Diagnosis not present

## 2016-12-31 DIAGNOSIS — R778 Other specified abnormalities of plasma proteins: Secondary | ICD-10-CM | POA: Diagnosis not present

## 2016-12-31 DIAGNOSIS — D890 Polyclonal hypergammaglobulinemia: Secondary | ICD-10-CM

## 2017-01-13 ENCOUNTER — Encounter: Payer: Managed Care, Other (non HMO) | Admitting: Hematology

## 2017-01-30 DIAGNOSIS — D890 Polyclonal hypergammaglobulinemia: Secondary | ICD-10-CM | POA: Insufficient documentation

## 2017-01-30 NOTE — Assessment & Plan Note (Signed)
47 y.o. female with reported history of rheumatoid arthritis and Sjogren's syndrome with mild, but possibly progressive hypergammaglobulinemia which appears to be polyclonal on serum protein electrophoresis.  Monoclonal gammopathy is not a sign of hematological disorder, but instead likely signifies presence of an infection problem or is a component of her underlying autoimmune disorder.    Plan: -- No additional hematological evaluation is necessary at this point based on the previously normal hematological profile. -- Polyclonal gammopathy can be further evaluated in terms of possible presence of infection complicating current immunosuppressive therapy.  I will defer this evaluation to the patient's primary care provider and Rheumatologist. -- Return to our clinic as needed if hematological abnormalities are detected in the future.

## 2017-01-30 NOTE — Progress Notes (Signed)
[facility] New Visit:  Assessment: Polyclonal gammopathy determined by serum protein electrophoresis 47 y.o. female with reported history of rheumatoid arthritis and Sjogren's syndrome with mild, but possibly progressive hypergammaglobulinemia which appears to be polyclonal on serum protein electrophoresis.  Monoclonal gammopathy is not a sign of hematological disorder, but instead likely signifies presence of an infection problem or is a component of her underlying autoimmune disorder.    Plan: -- No additional hematological evaluation is necessary at this point based on the previously normal hematological profile. -- Polyclonal gammopathy can be further evaluated in terms of possible presence of infection complicating current immunosuppressive therapy.  I will defer this evaluation to the patient's primary care provider and Rheumatologist. -- Return to our clinic as needed if hematological abnormalities are detected in the future.     No orders of the defined types were placed in this encounter.   All questions were answered.  . The patient knows to call the clinic with any problems, questions or concerns.  This note was electronically signed.    History of Presenting Illness Denise Carter 47 y.o. presenting to the [facility] for "Other specified abnormalities of plasma proteins", referred by Dr Jani Gravel.  Patient's past medical history is notable for presence of rheumatoid arthritis, Sjogren's syndrome, and hypertension as well as thyroid disease.  She is known to have some thyroid nodules and evaluation for that is pending.  Her lupus is currently undergoing therapy with Remicade and prednisone.  Patient does have chronic peripheral neuropathy which has not changed recently.  She denies any new musculoskeletal complaints compared to her baseline.  No new skin rashes, neurological deficits, gastrointestinal complaints.  Review of the lab work provided with  referral demonstrates total protein 7.7 with albumin of 3.2 obtained on 09/23/16.  Patient had no abnormal white blood cell count, hemoglobin was within normal range at 13.1, and platelets were normal at 184.  Repeat blood work obtained on 12/13/16 demonstrates total protein of 8.7 with albumin of 3.3.  At the same time sedimentation rate was noted to be 50.  Serum protein electrophoresis obtained on 12/21/16 demonstrated no evidence of monoclonal spike.  Analysis of the pattern reports polyclonal increase in gamma globulins.  Medical History: Past Medical History:  Diagnosis Date  . Hypertension   . Lupus   . Thyroid disease     Surgical History: Past Surgical History:  Procedure Laterality Date  . ABDOMINAL HYSTERECTOMY      Family History: History reviewed. No pertinent family history.  Social History: Social History   Socioeconomic History  . Marital status: Married    Spouse name: Not on file  . Number of children: Not on file  . Years of education: Not on file  . Highest education level: Not on file  Social Needs  . Financial resource strain: Not on file  . Food insecurity - worry: Not on file  . Food insecurity - inability: Not on file  . Transportation needs - medical: Not on file  . Transportation needs - non-medical: Not on file  Occupational History  . Not on file  Tobacco Use  . Smoking status: Current Every Day Smoker  . Smokeless tobacco: Never Used  Substance and Sexual Activity  . Alcohol use: No  . Drug use: No  . Sexual activity: Not on file  Other Topics Concern  . Not on file  Social History Narrative  . Not on file    Allergies: No Known Allergies  Medications:  Current Outpatient Medications  Medication Sig Dispense Refill  . amLODipine (NORVASC) 10 MG tablet Take 10 mg by mouth daily.    . cephALEXin (KEFLEX) 500 MG capsule 2 caps po bid x 7 days 28 capsule 0  . folic acid (FOLVITE) 1 MG tablet Take 3 mg by mouth daily.    Marland Kitchen  HYDROcodone-acetaminophen (NORCO/VICODIN) 5-325 MG tablet Take 1-2 tablets by mouth every 4 (four) hours as needed for moderate pain or severe pain. 15 tablet 0  . hydroxychloroquine (PLAQUENIL) 200 MG tablet Take 400 mg by mouth daily.    Marland Kitchen inFLIXimab (REMICADE) 100 MG injection Inject 500 mg into the vein every 30 (thirty) days.    Marland Kitchen lisinopril-hydrochlorothiazide (PRINZIDE,ZESTORETIC) 10-12.5 MG tablet Take 1 tablet by mouth daily.    . methotrexate (RHEUMATREX) 10 MG tablet Take 10 mg by mouth once a week. Caution: Chemotherapy. Protect from light.    . naproxen sodium (ANAPROX) 220 MG tablet Take 880 mg by mouth at bedtime.    . nebivolol (BYSTOLIC) 10 MG tablet Take 10 mg by mouth daily.    Marland Kitchen omeprazole (PRILOSEC) 20 MG capsule Take 1 capsule (20 mg total) by mouth daily. 30 capsule 0  . predniSONE (DELTASONE) 5 MG tablet Take 10 mg by mouth daily with breakfast.     . pregabalin (LYRICA) 100 MG capsule Take 300 mg by mouth at bedtime.     . sucralfate (CARAFATE) 1 GM/10ML suspension Take 10 mLs (1 g total) by mouth 4 (four) times daily -  with meals and at bedtime. 420 mL 0  . verapamil (CALAN-SR) 180 MG CR tablet Take 180 mg by mouth at bedtime.    . lidocaine (XYLOCAINE) 2 % solution Use as directed 15 mLs in the mouth or throat as needed for mouth pain. (Patient not taking: Reported on 10/22/2016) 100 mL 0  . lisinopril (PRINIVIL,ZESTRIL) 5 MG tablet Take 5 mg by mouth daily.     No current facility-administered medications for this visit.     Review of Systems: Review of Systems  Cardiovascular: Positive for chest pain.  All other systems reviewed and are negative.    PHYSICAL EXAMINATION Blood pressure (!) 175/114, pulse 79, temperature 98.7 F (37.1 C), temperature source Oral, resp. rate 18, height _0  (1.626 m), weight 149 lb 6.4 oz (67.8 kg), SpO2 100 %.  ECOG PERFORMANCE STATUS: 1 - Symptomatic but completely ambulatory  Physical Exam  Constitutional: She is oriented  to person, place, and time and well-developed, well-nourished, and in no distress. No distress.  HENT:  Head: Normocephalic and atraumatic.  Mouth/Throat: Oropharynx is clear and moist. No oropharyngeal exudate.  Eyes: Conjunctivae and EOM are normal. Pupils are equal, round, and reactive to light. No scleral icterus.  Neck: No thyromegaly present.  Cardiovascular: Normal rate, regular rhythm, normal heart sounds and intact distal pulses.  No murmur heard. Pulmonary/Chest: Breath sounds normal. No respiratory distress. She has no wheezes. She has no rales.  Abdominal: Soft. Bowel sounds are normal. She exhibits no distension. There is no tenderness. There is no rebound.  Musculoskeletal: She exhibits no edema.  Lymphadenopathy:    She has no cervical adenopathy.  Neurological: She is alert and oriented to person, place, and time. She has normal reflexes. No cranial nerve deficit.  Skin: Skin is warm and dry. No rash noted. She is not diaphoretic. No erythema.     LABORATORY DATA: I have personally reviewed the data as listed: No visits with results within  1 Week(s) from this visit.  Latest known visit with results is:  Admission on 10/22/2016, Discharged on 10/22/2016  Component Date Value Ref Range Status  . TSH 10/22/2016 0.797  0.350 - 4.500 uIU/mL Final   Performed by a 3rd Generation assay with a functional sensitivity of <=0.01 uIU/mL.  . Free T4 10/22/2016 1.04  0.61 - 1.12 ng/dL Final   Comment: (NOTE) Biotin ingestion may interfere with free T4 tests. If the results are inconsistent with the TSH level, previous test results, or the clinical presentation, then consider biotin interference. If needed, order repeat testing after stopping biotin.   . Sodium 10/22/2016 136  135 - 145 mmol/L Final  . Potassium 10/22/2016 2.8* 3.5 - 5.1 mmol/L Final  . Chloride 10/22/2016 100* 101 - 111 mmol/L Final  . CO2 10/22/2016 27  22 - 32 mmol/L Final  . Glucose, Bld 10/22/2016 100* 65 -  99 mg/dL Final  . BUN 10/22/2016 10  6 - 20 mg/dL Final  . Creatinine, Ser 10/22/2016 1.09* 0.44 - 1.00 mg/dL Final  . Calcium 10/22/2016 8.4* 8.9 - 10.3 mg/dL Final  . GFR calc non Af Amer 10/22/2016 59* >60 mL/min Final  . GFR calc Af Amer 10/22/2016 >60  >60 mL/min Final   Comment: (NOTE) The eGFR has been calculated using the CKD EPI equation. This calculation has not been validated in all clinical situations. eGFR's persistently <60 mL/min signify possible Chronic Kidney Disease.   . Anion gap 10/22/2016 9  5 - 15 Final  . Color, Urine 10/22/2016 AMBER* YELLOW Final   BIOCHEMICALS MAY BE AFFECTED BY COLOR  . APPearance 10/22/2016 HAZY* CLEAR Final  . Specific Gravity, Urine 10/22/2016 1.031* 1.005 - 1.030 Final  . pH 10/22/2016 5.0  5.0 - 8.0 Final  . Glucose, UA 10/22/2016 NEGATIVE  NEGATIVE mg/dL Final  . Hgb urine dipstick 10/22/2016 MODERATE* NEGATIVE Final  . Bilirubin Urine 10/22/2016 NEGATIVE  NEGATIVE Final  . Ketones, ur 10/22/2016 NEGATIVE  NEGATIVE mg/dL Final  . Protein, ur 10/22/2016 100* NEGATIVE mg/dL Final  . Nitrite 10/22/2016 NEGATIVE  NEGATIVE Final  . Leukocytes, UA 10/22/2016 MODERATE* NEGATIVE Final  . RBC / HPF 10/22/2016 0-5  0 - 5 RBC/hpf Final  . WBC, UA 10/22/2016 TOO NUMEROUS TO COUNT  0 - 5 WBC/hpf Final  . Bacteria, UA 10/22/2016 MANY* NONE SEEN Final  . Squamous Epithelial / LPF 10/22/2016 0-5* NONE SEEN Final  . WBC Clumps 10/22/2016 PRESENT   Final  . Mucus 10/22/2016 PRESENT   Final  . Specimen Description 10/22/2016 URINE, RANDOM   Final  . Special Requests 10/22/2016 NONE   Final  . Culture 10/22/2016 >=100,000 COLONIES/mL ESCHERICHIA COLI*  Final  . Report Status 10/22/2016 10/24/2016 FINAL   Final  . Organism ID, Bacteria 10/22/2016 ESCHERICHIA COLI*  Final         Ardath Sax, MD

## 2017-04-06 ENCOUNTER — Other Ambulatory Visit: Payer: Self-pay | Admitting: Endocrinology

## 2017-04-06 DIAGNOSIS — E041 Nontoxic single thyroid nodule: Secondary | ICD-10-CM

## 2017-04-11 ENCOUNTER — Ambulatory Visit
Admission: RE | Admit: 2017-04-11 | Discharge: 2017-04-11 | Disposition: A | Discharge status: Home / Self Care | Payer: Managed Care, Other (non HMO) | Source: Ambulatory Visit | Attending: Endocrinology | Admitting: Endocrinology

## 2017-04-11 DIAGNOSIS — E041 Nontoxic single thyroid nodule: Secondary | ICD-10-CM

## 2017-04-13 ENCOUNTER — Ambulatory Visit: Payer: Managed Care, Other (non HMO)

## 2017-04-22 ENCOUNTER — Other Ambulatory Visit: Payer: Self-pay

## 2017-04-22 ENCOUNTER — Ambulatory Visit: Payer: 59 | Attending: Rheumatology

## 2017-04-22 DIAGNOSIS — G8929 Other chronic pain: Secondary | ICD-10-CM

## 2017-04-22 DIAGNOSIS — M25562 Pain in left knee: Secondary | ICD-10-CM | POA: Insufficient documentation

## 2017-04-22 DIAGNOSIS — M545 Low back pain: Secondary | ICD-10-CM | POA: Insufficient documentation

## 2017-04-22 DIAGNOSIS — M069 Rheumatoid arthritis, unspecified: Secondary | ICD-10-CM

## 2017-04-22 NOTE — Therapy (Signed)
[place], [place], [address] Phone: [phone]   Fax:  [phone]  Physical Therapy Evaluation/Discharge  Patient Details  Name: Denise Carter MRN: [id number] Date of Birth: [birth year] Referring Provider: Lahoma Rocker, MD   Encounter Date: 04/22/2017  PT End of Session - 04/22/17 1059    PT Start Time  0705    PT Stop Time  1040    PT Time Calculation (min)  215 min    Activity Tolerance  Patient limited by pain    Behavior During Therapy  [facility] for tasks assessed/performed       Past Medical History:  Diagnosis Date  . Hypertension   . Lupus   . Thyroid disease     Past Surgical History:  Procedure Laterality Date  . ABDOMINAL HYSTERECTOMY      There were no vitals filed for this visit.   Subjective Assessment - 04/22/17 1057    Subjective  2-3 year history of RA . This limits tolerance for work at [employer]. She is transitining to long term disability.          [facility] PT Assessment - 04/22/17 0001      Assessment   Medical Diagnosis  RA     Referring Provider  Lahoma Rocker, MD              No data recorded  Objective measurements completed on examination: See above findings.              PT Education - 04/22/17 1058    Education provided  Yes    Education Details  Stay active but don't do any strenuous activity over next 3-4 days to allow post testing sorness to ease    Person(s) Educated  Patient    Methods  Explanation    Comprehension  Verbalized understanding                  Plan - 04/22/17 1100    Clinical Impression Statement  Denise Carter completed the FCE testing with rating of ligth duty for and 8 hour day. This was a minimum due to significant number of tasks self limited  as set by [employer] protocol    PT Next Visit Plan  FCE faxed to MD and scanned in EPIC . No followup    Consulted and Agree with Plan of Care  Patient        Patient will benefit from skilled therapeutic intervention in order to improve the following deficits and impairments:     Visit Diagnosis: Rheumatoid arthritis involving knee, unspecified laterality, unspecified rheumatoid factor presence (HCC)  Chronic bilateral low back pain, with sciatica presence unspecified  Chronic pain of left knee     Problem List Patient Active Problem List   Diagnosis Date Noted  . 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 [place], [place], [address] Phone: [phone]   Fax:  [phone]  Name: Denise Carter MRN: [id number] Date of Birth: 1970/05/07 PHYSICAL THERAPY DISCHARGE SUMMARY  Visits from Start of Care: 1  Current functional level related to goals / functional outcomes: See FCE scanned into EPC   Remaining deficits: NA   Education / Equipment: NA Plan: Patient agrees to discharge.  Patient goals were not met. Patient is being discharged due to                                                     ?????  Conmpleting the [employer]

## 2017-10-27 ENCOUNTER — Encounter (HOSPITAL_COMMUNITY): Payer: Self-pay | Admitting: Emergency Medicine

## 2017-10-27 ENCOUNTER — Ambulatory Visit (HOSPITAL_COMMUNITY)
Admission: EM | Admit: 2017-10-27 | Discharge: 2017-10-27 | Disposition: A | Discharge status: Home / Self Care | Payer: 59 | Attending: Family Medicine | Admitting: Family Medicine

## 2017-10-27 DIAGNOSIS — M329 Systemic lupus erythematosus, unspecified: Secondary | ICD-10-CM

## 2017-10-27 DIAGNOSIS — I10 Essential (primary) hypertension: Secondary | ICD-10-CM

## 2017-10-27 DIAGNOSIS — H5789 Other specified disorders of eye and adnexa: Secondary | ICD-10-CM

## 2017-10-27 MED ORDER — PREDNISONE 10 MG (48) PO TBPK: ORAL_TABLET | ORAL | 0 refills | Status: DC

## 2017-10-27 MED ORDER — HYDROXYCHLOROQUINE SULFATE 200 MG PO TABS: 400.0000 mg | ORAL_TABLET | Freq: Every day | ORAL | 1 refills | Status: DC

## 2017-10-27 MED ORDER — AMLODIPINE BESYLATE 10 MG PO TABS: 10.0000 mg | ORAL_TABLET | Freq: Every day | ORAL | 1 refills | Status: DC

## 2017-10-27 MED ORDER — LISINOPRIL-HYDROCHLOROTHIAZIDE 10-12.5 MG PO TABS: 1.0000 | ORAL_TABLET | Freq: Every day | ORAL | 1 refills | Status: DC

## 2017-10-27 MED ORDER — OLOPATADINE HCL 0.2 % OP SOLN: 1.0000 [drp] | Freq: Every day | OPHTHALMIC | 0 refills | Status: DC

## 2017-10-27 NOTE — ED Provider Notes (Signed)
[facility]   [id number] 10/27/17 Arrival Time: 6599  ASSESSMENT & PLAN:  1. Essential hypertension   2. Lupus (HCC)   3. Irritation of both eyes   -likely secondary to mild allergies  Meds ordered this encounter  Medications  . amLODipine (NORVASC) 10 MG tablet    Sig: Take 1 tablet (10 mg total) by mouth daily.    Dispense:  30 tablet    Refill:  1  . hydroxychloroquine (PLAQUENIL) 200 MG tablet    Sig: Take 2 tablets (400 mg total) by mouth daily.    Dispense:  60 tablet    Refill:  1  . lisinopril-hydrochlorothiazide (PRINZIDE,ZESTORETIC) 10-12.5 MG tablet    Sig: Take 1 tablet by mouth daily.    Dispense:  30 tablet    Refill:  1  . predniSONE (STERAPRED UNI-PAK 48 TAB) 10 MG (48) TBPK tablet    Sig: Take as directed.    Dispense:  48 tablet    Refill:  0   Follow-up Information    Schedule an appointment as soon as possible for a visit  with [facility].   Contact information: [address] [place] [address] [phone]         May f/u here as needed. Reviewed expectations re: course of current medical issues. Questions answered. Outlined signs and symptoms indicating need for more acute intervention. Patient verbalized understanding. After Visit Summary given.   SUBJECTIVE: History from: patient. Denise Carter is a 47 y.o. female who presents requesting medication refill. Past Medical History:  Diagnosis Date  . Hypertension   . Lupus (HCC)   . Thyroid disease   Has been out of medications for several months. Trying to find a new PCP.  Does report a rash that is fairly diffuse; torso and extremities mainly. Itching. Gradual onset for approx 3 months. No new exposures. No recent illnesses. No home/OTC treatment. Questions related to lupus. Has been on steroids in the past for similar rash. No myalgias/arthralgias.  Also reports bilateral eye irritation. On/off for months.  Occasional itching. No eye pain or visual changes. No home/OTC treatment. No specific aggravating or alleviating factors reported.  ROS: As per HPI.   OBJECTIVE:  Vitals:   10/27/17 1007  BP: (!) 181/124  Pulse: 99  Resp: 18  Temp: 98.5 F (36.9 C)  TempSrc: Oral  SpO2: 100%    General appearance: alert; no distress Eyes: PERRLA; EOMI; bilateral conjunctival injection; no drainage HENT: normocephalic; atraumatic Neck: supple  Lungs: clear to auscultation bilaterally Heart: regular rate and rhythm Abdomen: soft, non-tender Extremities: no edema; symmetrical with no gross deformities Skin: warm and dry; patches of irritated and mildly erythematous skin randomly over body; some excoriations Psychological: alert and cooperative; normal mood and affect  No Known Allergies  Past Medical History:  Diagnosis Date  . Hypertension   . Lupus (HCC)   . Thyroid disease    Social History   Socioeconomic History  . Marital status: Married    Spouse name: Not on file  . Number of children: Not on file  . Years of education: Not on file  . Highest education level: Not on file  Occupational History  . Not on file  Social Needs  . Financial resource strain: Not on file  . Food insecurity:    Worry: Not on file    Inability: Not on file  . Transportation needs:    Medical: Not on file  Non-medical: Not on file  Tobacco Use  . Smoking status: Current Every Day Smoker  . Smokeless tobacco: Never Used  Substance and Sexual Activity  . Alcohol use: No  . Drug use: No  . Sexual activity: Not on file  Lifestyle  . Physical activity:    Days per week: Not on file    Minutes per session: Not on file  . Stress: Not on file  Relationships  . Social connections:    Talks on phone: Not on file    Gets together: Not on file    Attends religious service: Not on file    Active member of club or organization: Not on file    Attends meetings of clubs or organizations: Not on  file    Relationship status: Not on file  . Intimate partner violence:    Fear of current or ex partner: Not on file    Emotionally abused: Not on file    Physically abused: Not on file    Forced sexual activity: Not on file  Other Topics Concern  . Not on file  Social History Narrative  . Not on file   No family history on file. Past Surgical History:  Procedure Laterality Date  . ABDOMINAL HYSTERECTOMY       Mardella Layman, MD 10/27/17 1048

## 2017-10-27 NOTE — ED Triage Notes (Signed)
PT reports history of lupus, RA, and hypertension. PT has been out of her meds since the end of June due to disability insurance issues with work.   PT reports rash on chest, back, and arms since July.

## 2017-10-27 NOTE — ED Notes (Signed)
Bed: UC01 Expected date:  Expected time:  Means of arrival:  Comments: Appointments 

## 2017-11-01 ENCOUNTER — Ambulatory Visit (INDEPENDENT_AMBULATORY_CARE_PROVIDER_SITE_OTHER): Payer: Self-pay | Admitting: Family Medicine

## 2017-11-01 ENCOUNTER — Encounter: Payer: Self-pay | Admitting: Family Medicine

## 2017-11-01 VITALS — BP 163/123 | HR 95 | Temp 97.7°F | Resp 17 | Ht 64.0 in | Wt 164.6 lb

## 2017-11-01 DIAGNOSIS — R21 Rash and other nonspecific skin eruption: Secondary | ICD-10-CM

## 2017-11-01 DIAGNOSIS — H04123 Dry eye syndrome of bilateral lacrimal glands: Secondary | ICD-10-CM

## 2017-11-01 DIAGNOSIS — E041 Nontoxic single thyroid nodule: Secondary | ICD-10-CM

## 2017-11-01 DIAGNOSIS — M329 Systemic lupus erythematosus, unspecified: Secondary | ICD-10-CM

## 2017-11-01 DIAGNOSIS — I160 Hypertensive urgency: Secondary | ICD-10-CM

## 2017-11-01 DIAGNOSIS — Z131 Encounter for screening for diabetes mellitus: Secondary | ICD-10-CM

## 2017-11-01 MED ORDER — TRIAMCINOLONE ACETONIDE 0.1 % EX CREA: 1.0000 "application " | TOPICAL_CREAM | Freq: Two times a day (BID) | CUTANEOUS | 3 refills | Status: DC

## 2017-11-01 MED ORDER — AMLODIPINE BESYLATE 10 MG PO TABS: 10.0000 mg | ORAL_TABLET | Freq: Every day | ORAL | 1 refills | Status: DC

## 2017-11-01 MED ORDER — OLOPATADINE HCL 0.2 % OP SOLN: 1.0000 [drp] | Freq: Every day | OPHTHALMIC | 0 refills | Status: DC

## 2017-11-01 MED ORDER — LISINOPRIL-HYDROCHLOROTHIAZIDE 10-12.5 MG PO TABS: 1.0000 | ORAL_TABLET | Freq: Every day | ORAL | 1 refills | Status: DC

## 2017-11-01 MED ORDER — PREDNISONE 20 MG PO TABS: ORAL_TABLET | ORAL | 0 refills | Status: DC

## 2017-11-01 MED ORDER — HYDROXYCHLOROQUINE SULFATE 200 MG PO TABS: 400.0000 mg | ORAL_TABLET | Freq: Every day | ORAL | 1 refills | Status: DC

## 2017-11-01 MED ORDER — PREGABALIN 100 MG PO CAPS: 100.0000 mg | ORAL_CAPSULE | Freq: Two times a day (BID) | ORAL | 0 refills | Status: DC

## 2017-11-01 MED FILL — HYDROXYCHLOROQUINE SULFATE: 200 | 30 days supply | Qty: 60 | Fill #0

## 2017-11-01 MED FILL — TRIAMCINOLONE 0.1% CREAM: 0.1 | 30 days supply | Qty: 454 | Fill #0

## 2017-11-01 MED FILL — AMLODIPINE BESYLATE 10 MG T: 10 | 30 days supply | Qty: 30 | Fill #0

## 2017-11-01 MED FILL — predniSONE 20 MG TABS: 20 | 9 days supply | Qty: 18 | Fill #0

## 2017-11-01 MED FILL — OLOPATADINE HCL 0.2% EYE DR: 0.2 | 20 days supply | Qty: 3 | Fill #0

## 2017-11-01 MED FILL — LISINOPRIL-HCTZ 10-12.5 MG: 10-12.5 | 30 days supply | Qty: 30 | Fill #0

## 2017-11-01 NOTE — Patient Instructions (Signed)
Go to [facility] to pickup medication and have fasting labs drawn. Be sure not to eat or drink before having labs drawn.  [facility]  [address] [place], [place] [address] [phone]     Thank you for choosing Primary Care at [facility] for your medical home!    Denise Carter was seen by Joaquin Courts, FNP today.   Denise Carter's primary care doctor is Bing Neighbors, FNP.   For the best care possible,  you should try to see Joaquin Courts, FNP  whenever you come to clinic.   We look forward to seeing you again soon!  If you have any questions about your visit today,  please call us at   Or feel free to reach your provider via MyChart.

## 2017-11-01 NOTE — Progress Notes (Signed)
Nattie Lazenby, is a 47 y.o. female  MWU:[id number]  VOZ:[id number]  DOB - March 24, 1970  CC:  Chief Complaint  Patient presents with  . Establish Care  . Follow-up    seen at ED 10/3 for uncontrolled BP, rash associated with Lupus & eye irritation. rash has not improved since started prednisone. states that BP readings haev improved. no chest pain, SHOB. is having some lower extremity swelling       HPI: Disaya is a 47 y.o. female, current every day smoker ,is here today to establish care. She lost her health insurance several months ago has been unable to obtain her chronic medications. She was previously followed by a Rheumatologist in [place].  Chronic conditions include chronic tobacco use, rheumatoid arthritis, lupus, hypertension, peripheral neuropathy, chronic low back pain secondary to degenerative disc disease, and thyroid nodule in presence of normal Thyroid panel.  Essential Hypertension Patient reports a long history of uncontrolled hypertension.  She has been off medications for several months.  During her recent visit at the urgent care on 10/27/2017 her medications were resumed.  However due to financial hardship she was unable to pick up medication.  She remains extremely hypertensive intensive today.  Reports no routine exercise dietary restrictions noted to sodium intake.  His blood pressure has been difficult to manage in the past she has been on at least 2 or more blood pressure medications previously to control blood pressure. Current Body mass index is 28.25 kg/m.   Chronic dermatitis rash Seen at [facility] urgent care on 10/27/2017 evaluation of worsening rash.  Rashes been present for 3 months.  Initially started on her chest , and later as to her back, and bilateral upper and lower extremities.  No previous diagnosis of eczema or problems with recurrent rashes. She has known autoimmune disease however she denies any association of new or worsening body aches with  the appearance of the rash.  She suffers from RA (previously followed by Dr. Kathlen Mody) and has chronic joint pain which was previously managed by rheumatology.  Previously prescribed Orencia and methotrexate for which she has not had either in greater than a year.  The provider prescribed her a course of the prednisone taper for which she did not have funds to pick up at the pharmacy so she has not initiated treatment.  She denies use of any OTC treatment.  Itching is worse at nighttime.    Eye irritation Recently seen at [facility] urgent care on 10/27/2017 with a complaint of eye irritation and itchy eyes. She reports no known history of seasonal indoor or outdoor allergies.  She has not attempted relief with any medication.  Denies section of foreign body.  No recent contact with chemicals or irritant eyes.  Denies diminished visual acuity.  She is asking for medication refills on all chronic meds.  Plans to apply for financial assistance through [facility].   Patient denies new headaches, chest pain, abdominal pain, nausea, new weakness , numbness or tingling, SOB, edema, or worrisome cough. .  Current medications: Current Outpatient Medications:  .  amLODipine (NORVASC) 10 MG tablet, Take 1 tablet (10 mg total) by mouth daily., Disp: 30 tablet, Rfl: 1 .  folic acid (FOLVITE) 1 MG tablet, Take 3 mg by mouth daily., Disp: , Rfl:  .  hydroxychloroquine (PLAQUENIL) 200 MG tablet, Take 2 tablets (400 mg total) by mouth daily., Disp: 60 tablet, Rfl: 1 .  lisinopril-hydrochlorothiazide (PRINZIDE,ZESTORETIC) 10-12.5 MG tablet, Take 1  tablet by mouth daily., Disp: 30 tablet, Rfl: 1 .  naproxen sodium (ANAPROX) 220 MG tablet, Take 880 mg by mouth at bedtime., Disp: , Rfl:  .  Olopatadine HCl 0.2 % SOLN, Apply 1 drop to eye daily., Disp: 2.5 mL, Rfl: 0 .  predniSONE (STERAPRED UNI-PAK 48 TAB) 10 MG (48) TBPK tablet, Take as directed., Disp: 48 tablet, Rfl: 0 .  pregabalin  (LYRICA) 100 MG capsule, Take 300 mg by mouth at bedtime. , Disp: , Rfl:    Pertinent family medical history: Patient reports a family history significant for autoimmune disease.  No Known Allergies  Social History   Socioeconomic History  . Marital status: Married    Spouse name: Not on file  . Number of children: Not on file  . Years of education: Not on file  . Highest education level: Not on file  Occupational History  . Not on file  Social Needs  . Financial resource strain: Not on file  . Food insecurity:    Worry: Not on file    Inability: Not on file  . Transportation needs:    Medical: Not on file    Non-medical: Not on file  Tobacco Use  . Smoking status: Current Every Day Smoker  . Smokeless tobacco: Never Used  Substance and Sexual Activity  . Alcohol use: No  . Drug use: No  . Sexual activity: Not on file  Lifestyle  . Physical activity:    Days per week: Not on file    Minutes per session: Not on file  . Stress: Not on file  Relationships  . Social connections:    Talks on phone: Not on file    Gets together: Not on file    Attends religious service: Not on file    Active member of club or organization: Not on file    Attends meetings of clubs or organizations: Not on file    Relationship status: Not on file  . Intimate partner violence:    Fear of current or ex partner: Not on file    Emotionally abused: Not on file    Physically abused: Not on file    Forced sexual activity: Not on file  Other Topics Concern  . Not on file  Social History Narrative  . Not on file    Review of Systems: Constitutional: Negative for fever, chills, diaphoresis, activity change, appetite change and fatigue. HENT: Negative for ear pain, nosebleeds, congestion, facial swelling, rhinorrhea, neck pain, neck stiffness and ear discharge.  Eyes: Positive for eye itching and dryness.  Negative for visual disturbances. Respiratory: Negative for cough, choking, chest  tightness, shortness of breath, wheezing and stridor.  Cardiovascular: Negative for chest pain, palpitations and leg swelling. Gastrointestinal: Negative for abdominal distention. Musculoskeletal: Positive for myalgias and arthralgias.  Negative for gait problem.  Negative for joint swelling . Neurological: Negative for dizziness, tremors, seizures, syncope, facial asymmetry, speech difficulty, weakness, light-headedness, numbness and headaches.  Hematological: Negative for adenopathy. Does not bruise/bleed easily. Psychiatric/Behavioral: Negative for hallucinations, behavioral problems, confusion, dysphoric mood, decreased concentration and agitation.  Objective:   Vitals:   11/01/17 1422 11/01/17 1510  BP: (!) 176/123 (!) 163/123  Pulse: 95   Resp: 17   Temp: 97.7 F (36.5 C)   SpO2: 97%     Physical Exam: Constitutional: Patient appears well, non-distressed, and cooperative HENT: Normocephalic, atraumatic, External right and left ear normal. Oropharynx is clear and moist.  Missing teeth. Eyes: Conjunctivae and EOM are normal.  PERRLA, no scleral icterus. Neck: Normal ROM. Neck supple. No JVD. No tracheal deviation. No thyromegaly. CVS: RRR, S1/S2 +, no murmurs, no gallops, no carotid bruit.  Pulmonary: Breath sounds are diminished bilaterally.  She is negative for stridor, rhonchi, wheezes, rales.  Abdominal: Soft. BS +, no distension, tenderness, rebound or guarding.  Musculoskeletal: Normal range of motion. No edema and no tenderness.  Lymphadenopathy: No lymphadenopathy noted, cervical, inguinal or axillary Neuro: Alert. Normal reflexes, muscle tone coordination. No cranial nerve deficit. Skin: Skin is warm and dry. No rash noted. Not diaphoretic. No erythema. No pallor. Psychiatric: Normal mood and affect. Behavior, judgment, thought content normal. Lab Results  Component Value Date   WBC 11.3 (H) 10/19/2016   HGB 12.5 10/19/2016   HCT 38.4 10/19/2016   MCV 85.0 10/19/2016    PLT 217 10/19/2016   Lab Results  Component Value Date   CREATININE 1.09 (H) 10/22/2016   BUN 10 10/22/2016   NA 136 10/22/2016   K 2.8 (L) 10/22/2016   CL 100 (L) 10/22/2016   CO2 27 10/22/2016       Assessment and plan:  1. Thyroid nodule,  Ultrasound of the neck 04/11/2017: IMPRESSION:Stable 1.5 cm TR 3 nodule in the inferior left thyroid lobe. No significant change since the previous examination. This nodule meets criteria for follow-up until there is 5 years of stability Given that this is a chronic stable nodule will plan to monitor thyroid panel. In follow-up with a ultrasound accommodations in 5 years unless thyroid panel abnormal.   - Thyroid Panel With TSH; Future  2. Hypertensive urgency, patient has been unable to obtain medications due to financial barriers.  [facility] and are available for pickup today.  We will closely monitor blood pressure and titrate medications to achieve a goal reading of less than 140/90.  Will obtain a fasting lipid panel was well. Resume amlodipine 10 mg once daily and lisinopril/HCTZ 1012.5 mg. Encouraged low-sodium diet.  And increase activity such as walking at least 3-4 times per week.   .3. Screening for diabetes mellitus - Lipid panel; Future - Hemoglobin A1c; Future - Comprehensive metabolic panel; Future  4. Lupus (HCC),  Will resume Plaquenil and Lyrica (pain secondary to joint pain) for now.  Patient will need to obtain financial assistance as she will require rheumatology management for history of autoimmune disease.  She is given paperwork to complete for financial assistance.   5. Rash and nonspecific skin eruption, generalized dermatitis-like rash.  Skin condition is very dry well-nourished.  Will treat with an oral prednisone taper as well as add a low potency topical steroid will cream.  Patient advised to appropriately hydrate skin this can often worsen dermatitis-like  rashes.  Will check a CBC to rule out elevation  6. Dry eyes, will trial Olopatadine to hydrate eyes. Uncertain if this is related to autoimmune conditions and or allergies. Will continue to monitor effectiveness of treatment.  Follow-up in 2 weeks for a hypertension recheck and to receive recommended immunizations.  At present we are out of of immunizations today.  I would like for patient to follow-up in 1 to 2 weeks to receive a tetanus, flu shot, and Pneumovax 23.   Meds ordered this encounter  Medications  . predniSONE (DELTASONE) 20 MG tablet    Sig: Take 3 PO QAM x3days, 2 PO QAM x3days, 1 PO QAM x3days    Dispense:  18 tablet    Refill:  0  .  triamcinolone cream (KENALOG) 0.1 %    Sig: Apply 1 application topically 2 (two) times daily.    Dispense:  454 g    Refill:  3  . Olopatadine HCl 0.2 % SOLN    Sig: Apply 1 drop to eye daily.    Dispense:  2.5 mL    Refill:  0  . lisinopril-hydrochlorothiazide (PRINZIDE,ZESTORETIC) 10-12.5 MG tablet    Sig: Take 1 tablet by mouth daily.    Dispense:  30 tablet    Refill:  1  . hydroxychloroquine (PLAQUENIL) 200 MG tablet    Sig: Take 2 tablets (400 mg total) by mouth daily.    Dispense:  60 tablet    Refill:  1  . amLODipine (NORVASC) 10 MG tablet    Sig: Take 1 tablet (10 mg total) by mouth daily.    Dispense:  30 tablet    Refill:  1  . pregabalin (LYRICA) 100 MG capsule    Sig: Take 1 capsule (100 mg total) by mouth 2 (two) times daily.    Dispense:  60 capsule    Refill:  0    Orders Placed This Encounter  Procedures  . Thyroid Panel With TSH    Standing Status:   Future    Standing Expiration Date:   11/02/2018  . Hemoglobin A1c    Standing Status:   Future    Standing Expiration Date:   11/02/2018  . CBC with Differential    Standing Status:   Future    Standing Expiration Date:   11/02/2018  . Lipid panel    Standing Status:   Future    Standing Expiration Date:   11/02/2018    Order Specific Question:   Has  the patient fasted?    Answer:   No  . Comprehensive metabolic panel    Standing Status:   Future    Standing Expiration Date:   11/02/2018    Order Specific Question:   Has the patient fasted?    Answer:   No       The patient was given clear instructions to go to ER or return to medical center if symptoms don't improve, worsen or new problems develop. The patient verbalized understanding. The patient was told to call to get lab results if they haven't heard anything in the next week.    Joaquin Courts, FNP Primary Care at [facility] [address] [place] [address] [phone]: [phone]   A total of 50 minutes spent, greater than 50 % of this time was spent counseling and coordination of care.  This note has been created with Education officer, environmental. Any transcriptional errors are unintentional.

## 2017-11-02 ENCOUNTER — Telehealth: Payer: Self-pay | Admitting: Family Medicine

## 2017-11-02 ENCOUNTER — Ambulatory Visit: Payer: Self-pay | Attending: Family Medicine

## 2017-11-02 DIAGNOSIS — Z131 Encounter for screening for diabetes mellitus: Secondary | ICD-10-CM | POA: Insufficient documentation

## 2017-11-02 DIAGNOSIS — M329 Systemic lupus erythematosus, unspecified: Secondary | ICD-10-CM | POA: Insufficient documentation

## 2017-11-02 DIAGNOSIS — E041 Nontoxic single thyroid nodule: Secondary | ICD-10-CM

## 2017-11-02 DIAGNOSIS — I160 Hypertensive urgency: Secondary | ICD-10-CM | POA: Insufficient documentation

## 2017-11-02 DIAGNOSIS — R21 Rash and other nonspecific skin eruption: Secondary | ICD-10-CM | POA: Insufficient documentation

## 2017-11-02 MED ORDER — PREGABALIN 100 MG PO CAPS: 100.0000 mg | ORAL_CAPSULE | Freq: Two times a day (BID) | ORAL | 0 refills | Status: DC

## 2017-11-02 NOTE — Telephone Encounter (Addendum)
See below. Will call afterwards.

## 2017-11-02 NOTE — Progress Notes (Signed)
Patient here for lab visit only 

## 2017-11-02 NOTE — Telephone Encounter (Signed)
Medication sent electronically to Public

## 2017-11-02 NOTE — Telephone Encounter (Signed)
Patient called stating that medication pregabalin (LYRICA) 100 MG capsule [[id number]] was not available at [facility] yesterday when she tried to pick it up. Patient request medication be sent to [facility] in [place]. Please follow up as needed.

## 2017-11-03 LAB — COMPREHENSIVE METABOLIC PANEL
ALT: 5 IU/L (ref 0–32)
AST: 13 IU/L (ref 0–40)
Albumin/Globulin Ratio: 1.2 (ref 1.2–2.2)
Albumin: 4 g/dL (ref 3.5–5.5)
Alkaline Phosphatase: 61 IU/L (ref 39–117)
BUN/Creatinine Ratio: 8 — ABNORMAL LOW (ref 9–23)
BUN: 7 mg/dL (ref 6–24)
Bilirubin Total: 0.3 mg/dL (ref 0.0–1.2)
CO2: 20 mmol/L (ref 20–29)
Calcium: 9.2 mg/dL (ref 8.7–10.2)
Chloride: 104 mmol/L (ref 96–106)
Creatinine, Ser: 0.85 mg/dL (ref 0.57–1.00)
GFR calc Af Amer: 94 mL/min/{1.73_m2} (ref >59)
GFR calc non Af Amer: 81 mL/min/{1.73_m2} (ref >59)
Globulin, Total: 3.3 g/dL (ref 1.5–4.5)
Glucose: 80 mg/dL (ref 65–99)
Potassium: 3.7 mmol/L (ref 3.5–5.2)
Sodium: 143 mmol/L (ref 134–144)
Total Protein: 7.3 g/dL (ref 6.0–8.5)

## 2017-11-03 LAB — THYROID PANEL WITH TSH
Free Thyroxine Index: 1.7 (ref 1.2–4.9)
T3 Uptake Ratio: 28 % (ref 24–39)
T4, Total: 5.9 ug/dL (ref 4.5–12.0)
TSH: 1.95 u[IU]/mL (ref 0.450–4.500)

## 2017-11-03 LAB — LIPID PANEL
Chol/HDL Ratio: 3.2 ratio (ref 0.0–4.4)
Cholesterol, Total: 165 mg/dL (ref 100–199)
HDL: 51 mg/dL (ref >39)
LDL Calculated: 98 mg/dL (ref 0–99)
Triglycerides: 80 mg/dL (ref 0–149)
VLDL Cholesterol Cal: 16 mg/dL (ref 5–40)

## 2017-11-03 LAB — HEMOGLOBIN A1C
Est. average glucose Bld gHb Est-mCnc: 91 mg/dL
Hgb A1c MFr Bld: 4.8 % (ref 4.8–5.6)

## 2017-11-04 ENCOUNTER — Telehealth: Payer: Self-pay | Admitting: Family Medicine

## 2017-11-04 LAB — CBC WITH DIFFERENTIAL/PLATELET
Basophils Absolute: 0 10*3/uL (ref 0.0–0.2)
Basos: 0 %
EOS (ABSOLUTE): 0.2 10*3/uL (ref 0.0–0.4)
Eos: 2 %
Hematocrit: 40.2 % (ref 34.0–46.6)
Hemoglobin: 12.8 g/dL (ref 11.1–15.9)
Immature Grans (Abs): 0 10*3/uL (ref 0.0–0.1)
Immature Granulocytes: 0 %
Lymphocytes Absolute: 2.7 10*3/uL (ref 0.7–3.1)
Lymphs: 32 %
MCH: 27.9 pg (ref 26.6–33.0)
MCHC: 31.8 g/dL (ref 31.5–35.7)
MCV: 88 fL (ref 79–97)
Monocytes Absolute: 0.4 10*3/uL (ref 0.1–0.9)
Monocytes: 5 %
Neutrophils Absolute: 5.2 10*3/uL (ref 1.4–7.0)
Neutrophils: 61 %
Platelets: 164 10*3/uL (ref 150–450)
RBC: 4.58 x10E6/uL (ref 3.77–5.28)
RDW: 14.4 % (ref 12.3–15.4)
WBC: 8.5 10*3/uL (ref 3.4–10.8)

## 2017-11-04 LAB — SPECIMEN STATUS REPORT

## 2017-11-04 NOTE — Telephone Encounter (Signed)
Please contact [facility] to request faxed copy of CBC. Results not viewable on Epic all other labs are viewable.

## 2017-11-04 NOTE — Telephone Encounter (Signed)
CBC results are in the chart.

## 2017-11-08 ENCOUNTER — Telehealth: Payer: Self-pay | Admitting: Licensed Clinical Social Worker

## 2017-11-08 NOTE — Telephone Encounter (Signed)
Call placed to patient in attempt to schedule behavioral health appointment. A voicemail was not setup; therefore, LCSWA was unable to leave a message for a return call.

## 2017-11-09 ENCOUNTER — Other Ambulatory Visit: Payer: Self-pay

## 2017-11-09 NOTE — Progress Notes (Signed)
Patient notified of results. Expressed understanding.

## 2017-11-15 ENCOUNTER — Ambulatory Visit: Payer: Self-pay | Admitting: Family Medicine

## 2017-11-16 ENCOUNTER — Ambulatory Visit: Payer: Self-pay | Attending: Family Medicine

## 2017-11-17 ENCOUNTER — Telehealth: Payer: Self-pay | Admitting: Licensed Clinical Social Worker

## 2017-11-17 NOTE — Telephone Encounter (Signed)
LCSWA placed call to patient, introduced self, and explained role at [facility]. LCSWA informed pt of consult from PCP to address behavioral health and/or resource needs.   Pt scheduled appointment with LCSWA for 11/18/17. No additional concerns noted.

## 2017-11-18 ENCOUNTER — Ambulatory Visit: Payer: Self-pay | Attending: Family Medicine | Admitting: Licensed Clinical Social Worker

## 2017-11-18 DIAGNOSIS — Z598 Other problems related to housing and economic circumstances: Secondary | ICD-10-CM

## 2017-11-18 DIAGNOSIS — F331 Major depressive disorder, recurrent, moderate: Secondary | ICD-10-CM

## 2017-11-18 DIAGNOSIS — F419 Anxiety disorder, unspecified: Secondary | ICD-10-CM

## 2017-11-18 DIAGNOSIS — Z599 Problem related to housing and economic circumstances, unspecified: Secondary | ICD-10-CM

## 2017-11-18 NOTE — BH Specialist Note (Signed)
Integrated Behavioral Health Initial Visit  MRN: [id number] Name: Denise Carter  Number of Integrated Behavioral Health Clinician visits:: 1/6 Session Start time: 9:45 AM  Session End time: 10:30 AM Total time: 45 minutes  Type of Service: Integrated Behavioral Health- Individual/Family Interpretor:No. Interpretor Name and Language: N/A   SUBJECTIVE: Denise Carter is a 47 y.o. female accompanied by self Patient was referred by NP Tiburcio Pea for depression and anxiety. Patient reports the following symptoms/concerns: feelings of sadness and worry, difficulty sleeping, low energy, feeling bad about self, decreased concentration, restlessness, irritability, and hx of suicidal ideations Duration of problem: Ongoing; Severity of problem: moderate  OBJECTIVE: Mood: Anxious and Affect: Appropriate Risk of harm to self or others: No plan to harm self or others Pt scored positive on phq9; however, denies active SI. Protective factors were identified, safety plan was discussed, and crisis intervention resources were provided.  LIFE CONTEXT: Family and Social: Pt receives strong support from family and spouse. She has seven adult children and multiple grandchildren School/Work: Pt is on long term disability through employer, Biochemist, clinical. She has a pending case for social security and a Clinical research associate to assist. Pt receives food stamps ($15) Self-Care: Pt establishes healthy boundaries about her physical limitations with family.  Life Changes: Pt has ongoing medical conditions and is experiencing financial strain  GOALS ADDRESSED: Patient will: 1. Reduce symptoms of: anxiety, depression and stress 2. Increase knowledge and/or ability of: coping skills and healthy habits  3. Demonstrate ability to: Increase healthy adjustment to current life circumstances and Increase adequate support systems for patient/family  INTERVENTIONS: Interventions utilized: Solution-Focused Strategies, Supportive Counseling,  Psychoeducation and/or Health Education and Link to [facility]  Standardized Assessments completed: Not Needed  ASSESSMENT: Patient currently experiencing depression and anxiety triggered by ongoing medical conditions and financial strain. She reports feelings of sadness and worry, difficulty sleeping, low energy, feeling bad about self, decreased concentration, restlessness, irritability, and hx of suicidal ideations. Protective factors were identified, safety plan was discussed, and crisis intervention resources were provided.   Patient may benefit from psychoeducation, psychotherapy, and medication management. LCSWA educated pt on the correlation between one's physical and mental health, in addition, to how stress can negatively impact both. Therapeutic interventions were discussed to decrease symptoms and promote positive feelings and hope. Supportive resources were provided to assist with food insecurity. Pt has completed financial counseling to assist with coverage and medication costs.   PLAN: 1. Follow up with behavioral health clinician on : Pt was encouraged to contact LCSWA if symptoms worsen or fail to improve to schedule behavioral appointments at [facility]. 2. Behavioral recommendations: LCSWA recommends that pt apply healthy coping skills discussed, utilize provided resources, and follow up with financial counselor. Pt is encouraged to schedule follow up appointment with LCSWA 3. Referral(s): Integrated Art gallery manager (In Clinic) and [employer] Resources:  Academic librarian 4. "From scale of 1-10, how likely are you to follow plan?": 10/10  Bridgett Larsson, LCSW 11/18/17 4:27 PM

## 2017-11-23 ENCOUNTER — Other Ambulatory Visit (HOSPITAL_COMMUNITY)
Admission: RE | Admit: 2017-11-23 | Discharge: 2017-11-23 | Disposition: A | Discharge status: Home / Self Care | Payer: Self-pay | Source: Ambulatory Visit | Attending: Family Medicine | Admitting: Family Medicine

## 2017-11-23 ENCOUNTER — Ambulatory Visit (INDEPENDENT_AMBULATORY_CARE_PROVIDER_SITE_OTHER): Payer: Self-pay | Admitting: Family Medicine

## 2017-11-23 ENCOUNTER — Encounter: Payer: Self-pay | Admitting: Family Medicine

## 2017-11-23 VITALS — BP 161/116 | HR 101 | Temp 98.2°F | Resp 17 | Ht 64.0 in | Wt 166.8 lb

## 2017-11-23 DIAGNOSIS — Z124 Encounter for screening for malignant neoplasm of cervix: Secondary | ICD-10-CM | POA: Insufficient documentation

## 2017-11-23 DIAGNOSIS — Z1239 Encounter for other screening for malignant neoplasm of breast: Secondary | ICD-10-CM

## 2017-11-23 DIAGNOSIS — I10 Essential (primary) hypertension: Secondary | ICD-10-CM

## 2017-11-23 DIAGNOSIS — Z01419 Encounter for gynecological examination (general) (routine) without abnormal findings: Secondary | ICD-10-CM

## 2017-11-23 MED ORDER — LISINOPRIL-HYDROCHLOROTHIAZIDE 10-12.5 MG PO TABS: 2.0000 | ORAL_TABLET | Freq: Every day | ORAL | 1 refills | Status: DC

## 2017-11-23 MED ORDER — NIFEDIPINE ER OSMOTIC RELEASE 30 MG PO TB24: 30.0000 mg | ORAL_TABLET | Freq: Every day | ORAL | 2 refills | Status: DC

## 2017-11-23 MED ORDER — PREGABALIN 100 MG PO CAPS: 100.0000 mg | ORAL_CAPSULE | Freq: Two times a day (BID) | ORAL | 0 refills | Status: DC

## 2017-11-23 NOTE — Telephone Encounter (Signed)
Attempted to call [facility]. Pharmacy doesn't open until 10 AM.

## 2017-11-23 NOTE — Patient Instructions (Addendum)
Discontinue Amlodipine. Start nifedipine 30 mg once daily. Continue lisinopril-HCTZ Change dose to 2 tablets once daily       Health Maintenance, Female Adopting a healthy lifestyle and getting preventive care can go a long way to promote health and wellness. Talk with your health care provider about what schedule of regular examinations is right for you. This is a good chance for you to check in with your provider about disease prevention and staying healthy. In between checkups, there are plenty of things you can do on your own. Experts have done a lot of research about which lifestyle changes and preventive measures are most likely to keep you healthy. Ask your health care provider for more information. Weight and diet Eat a healthy diet  Be sure to include plenty of vegetables, fruits, low-fat dairy products, and lean protein.  Do not eat a lot of foods high in solid fats, added sugars, or salt.  Get regular exercise. This is one of the most important things you can do for your health. ? Most adults should exercise for at least 150 minutes each week. The exercise should increase your heart rate and make you sweat (moderate-intensity exercise). ? Most adults should also do strengthening exercises at least twice a week. This is in addition to the moderate-intensity exercise.  Maintain a healthy weight  Body mass index (BMI) is a measurement that can be used to identify possible weight problems. It estimates body fat based on height and weight. Your health care provider can help determine your BMI and help you achieve or maintain a healthy weight.  For females 72 years of age and older: ? A BMI below 18.5 is considered underweight. ? A BMI of 18.5 to 24.9 is normal. ? A BMI of 25 to 29.9 is considered overweight. ? A BMI of 30 and above is considered obese.  Watch levels of cholesterol and blood lipids  You should start having your blood tested for lipids and cholesterol at 48 years of  age, then have this test every 5 years.  You may need to have your cholesterol levels checked more often if: ? Your lipid or cholesterol levels are high. ? You are older than 48 years of age. ? You are at high risk for heart disease.  Cancer screening Lung Cancer  Lung cancer screening is recommended for adults 72-52 years old who are at high risk for lung cancer because of a history of smoking.  A yearly low-dose CT scan of the lungs is recommended for people who: ? Currently smoke. ? Have quit within the past 15 years. ? Have at least a 30-pack-year history of smoking. A pack year is smoking an average of one pack of cigarettes a day for 1 year.  Yearly screening should continue until it has been 15 years since you quit.  Yearly screening should stop if you develop a health problem that would prevent you from having lung cancer treatment.  Breast Cancer  Practice breast self-awareness. This means understanding how your breasts normally appear and feel.  It also means doing regular breast self-exams. Let your health care provider know about any changes, no matter how small.  If you are in your 20s or 30s, you should have a clinical breast exam (CBE) by a health care provider every 1-3 years as part of a regular health exam.  If you are 97 or older, have a CBE every year. Also consider having a breast X-ray (mammogram) every year.  If you  have a family history of breast cancer, talk to your health care provider about genetic screening.  If you are at high risk for breast cancer, talk to your health care provider about having an MRI and a mammogram every year.  Breast cancer gene (BRCA) assessment is recommended for women who have family members with BRCA-related cancers. BRCA-related cancers include: ? Breast. ? Ovarian. ? Tubal. ? Peritoneal cancers.  Results of the assessment will determine the need for genetic counseling and BRCA1 and BRCA2 testing.  Cervical  Cancer Your health care provider may recommend that you be screened regularly for cancer of the pelvic organs (ovaries, uterus, and vagina). This screening involves a pelvic examination, including checking for microscopic changes to the surface of your cervix (Pap test). You may be encouraged to have this screening done every 3 years, beginning at age 45.  For women ages 13-65, health care providers may recommend pelvic exams and Pap testing every 3 years, or they may recommend the Pap and pelvic exam, combined with testing for human papilloma virus (HPV), every 5 years. Some types of HPV increase your risk of cervical cancer. Testing for HPV may also be done on women of any age with unclear Pap test results.  Other health care providers may not recommend any screening for nonpregnant women who are considered low risk for pelvic cancer and who do not have symptoms. Ask your health care provider if a screening pelvic exam is right for you.  If you have had past treatment for cervical cancer or a condition that could lead to cancer, you need Pap tests and screening for cancer for at least 20 years after your treatment. If Pap tests have been discontinued, your risk factors (such as having a new sexual partner) need to be reassessed to determine if screening should resume. Some women have medical problems that increase the chance of getting cervical cancer. In these cases, your health care provider may recommend more frequent screening and Pap tests.  Colorectal Cancer  This type of cancer can be detected and often prevented.  Routine colorectal cancer screening usually begins at 48 years of age and continues through 48 years of age.  Your health care provider may recommend screening at an earlier age if you have risk factors for colon cancer.  Your health care provider may also recommend using home test kits to check for hidden blood in the stool.  A small camera at the end of a tube can be used to  examine your colon directly (sigmoidoscopy or colonoscopy). This is done to check for the earliest forms of colorectal cancer.  Routine screening usually begins at age 42.  Direct examination of the colon should be repeated every 5-10 years through 48 years of age. However, you may need to be screened more often if early forms of precancerous polyps or small growths are found.  Skin Cancer  Check your skin from head to toe regularly.  Tell your health care provider about any new moles or changes in moles, especially if there is a change in a mole's shape or color.  Also tell your health care provider if you have a mole that is larger than the size of a pencil eraser.  Always use sunscreen. Apply sunscreen liberally and repeatedly throughout the day.  Protect yourself by wearing long sleeves, pants, a wide-brimmed hat, and sunglasses whenever you are outside.  Heart disease, diabetes, and high blood pressure  High blood pressure causes heart disease and increases the  risk of stroke. High blood pressure is more likely to develop in: ? People who have blood pressure in the high end of the normal range (130-139/85-89 mm Hg). ? People who are overweight or obese. ? People who are African American.  If you are 59-63 years of age, have your blood pressure checked every 3-5 years. If you are 32 years of age or older, have your blood pressure checked every year. You should have your blood pressure measured twice-once when you are at a hospital or clinic, and once when you are not at a hospital or clinic. Record the average of the two measurements. To check your blood pressure when you are not at a hospital or clinic, you can use: ? An automated blood pressure machine at a pharmacy. ? A home blood pressure monitor.  If you are between 37 years and 78 years old, ask your health care provider if you should take aspirin to prevent strokes.  Have regular diabetes screenings. This involves taking a  blood sample to check your fasting blood sugar level. ? If you are at a normal weight and have a low risk for diabetes, have this test once every three years after 48 years of age. ? If you are overweight and have a high risk for diabetes, consider being tested at a younger age or more often. Preventing infection Hepatitis B  If you have a higher risk for hepatitis B, you should be screened for this virus. You are considered at high risk for hepatitis B if: ? You were born in a country where hepatitis B is common. Ask your health care provider which countries are considered high risk. ? Your parents were born in a high-risk country, and you have not been immunized against hepatitis B (hepatitis B vaccine). ? You have HIV or AIDS. ? You use needles to inject street drugs. ? You live with someone who has hepatitis B. ? You have had sex with someone who has hepatitis B. ? You get hemodialysis treatment. ? You take certain medicines for conditions, including cancer, organ transplantation, and autoimmune conditions.  Hepatitis C  Blood testing is recommended for: ? Everyone born from [birth year] through 1966. ? Anyone with known risk factors for hepatitis C.  Sexually transmitted infections (STIs)  You should be screened for sexually transmitted infections (STIs) including gonorrhea and chlamydia if: ? You are sexually active and are younger than 48 years of age. ? You are older than 48 years of age and your health care provider tells you that you are at risk for this type of infection. ? Your sexual activity has changed since you were last screened and you are at an increased risk for chlamydia or gonorrhea. Ask your health care provider if you are at risk.  If you do not have HIV, but are at risk, it may be recommended that you take a prescription medicine daily to prevent HIV infection. This is called pre-exposure prophylaxis (PrEP). You are considered at risk if: ? You are sexually active and  do not regularly use condoms or know the HIV status of your partner(s). ? You take drugs by injection. ? You are sexually active with a partner who has HIV.  Talk with your health care provider about whether you are at high risk of being infected with HIV. If you choose to begin PrEP, you should first be tested for HIV. You should then be tested every 3 months for as long as you are taking PrEP. Pregnancy  If you are premenopausal and you may become pregnant, ask your health care provider about preconception counseling.  If you may become pregnant, take 400 to 800 micrograms (mcg) of folic acid every day.  If you want to prevent pregnancy, talk to your health care provider about birth control (contraception). Osteoporosis and menopause  Osteoporosis is a disease in which the bones lose minerals and strength with aging. This can result in serious bone fractures. Your risk for osteoporosis can be identified using a bone density scan.  If you are 39 years of age or older, or if you are at risk for osteoporosis and fractures, ask your health care provider if you should be screened.  Ask your health care provider whether you should take a calcium or vitamin D supplement to lower your risk for osteoporosis.  Menopause may have certain physical symptoms and risks.  Hormone replacement therapy may reduce some of these symptoms and risks. Talk to your health care provider about whether hormone replacement therapy is right for you. Follow these instructions at home:  Schedule regular health, dental, and eye exams.  Stay current with your immunizations.  Do not use any tobacco products including cigarettes, chewing tobacco, or electronic cigarettes.  If you are pregnant, do not drink alcohol.  If you are breastfeeding, limit how much and how often you drink alcohol.  Limit alcohol intake to no more than 1 drink per day for nonpregnant women. One drink equals 12 ounces of beer, 5 ounces of  wine, or 1 ounces of hard liquor.  Do not use street drugs.  Do not share needles.  Ask your health care provider for help if you need support or information about quitting drugs.  Tell your health care provider if you often feel depressed.  Tell your health care provider if you have ever been abused or do not feel safe at home. This information is not intended to replace advice given to you by your health care provider. Make sure you discuss any questions you have with your health care provider. Document Released: 07/27/2010 Document Revised: 06/19/2015 Document Reviewed: 10/15/2014 Elsevier Interactive Patient Education  [employer].

## 2017-11-23 NOTE — Progress Notes (Signed)
Patient ID: Denise Carter, female    DOB: [birth year], 47 y.o.   MRN: [id number]  PCP: Bing Neighbors, FNP  Chief Complaint  Patient presents with  . Hypertension  . Gynecologic Exam    had a partial hysterectomy. no history of an abnormal pap.    Subjective:  HPI Denise Carter is a 47 y.o. female presents for blood pressure follow-up and routine PAP.  Hypertension follow-up Patient reports that since starting amlodipine along with the lisinopril-HCTZ she has started having headaches headaches occur only around the time she takes the medication although the headaches are sometimes intense.  She reports that she was previously only taking lisinopril-HCTZ and at some point she took verapamil.  Reports she is never taken Norvasc in the past and wonders if this is the cause of her symptoms.  Would like to try different medication.  She is extremely hypertensive today although notes she took her medicine less than an hour ago.  She denies any dizziness, shortness of breath , gait instability, or visual disturbances.  Is occasionally checking blood pressure at home and reports that it has remained elevated in the 150s at times she is gotten down into the 130s systolic.  The current every day smoker.  She adheres to a low-sodium diet.   Gynecological Exam Reports having a partial hysterectomy approximately 2 years ago.  Uncertain of what is remaining.  She denies having a Pap after the procedure and is uncertain history of an abnormal Pap. Denies any concerns for STDs. She is overdue for her mammogram is any prior history of an abnormal breast exam. Social History   Socioeconomic History  . Marital status: Married    Spouse name: Not on file  . Number of children: Not on file  . Years of education: Not on file  . Highest education level: Not on file  Occupational History  . Not on file  Social Needs  . Financial resource strain: Not on file  . Food insecurity:    Worry: Not  on file    Inability: Not on file  . Transportation needs:    Medical: Not on file    Non-medical: Not on file  Tobacco Use  . Smoking status: Current Every Day Smoker  . Smokeless tobacco: Never Used  Substance and Sexual Activity  . Alcohol use: No  . Drug use: No  . Sexual activity: Not on file  Lifestyle  . Physical activity:    Days per week: Not on file    Minutes per session: Not on file  . Stress: Not on file  Relationships  . Social connections:    Talks on phone: Not on file    Gets together: Not on file    Attends religious service: Not on file    Active member of club or organization: Not on file    Attends meetings of clubs or organizations: Not on file    Relationship status: Not on file  . Intimate partner violence:    Fear of current or ex partner: Not on file    Emotionally abused: Not on file    Physically abused: Not on file    Forced sexual activity: Not on file  Other Topics Concern  . Not on file  Social History Narrative  . Not on file   Review of Systems  Pertinent negatives listed in HPI Patient Active Problem List   Diagnosis Date Noted  . Polyclonal gammopathy determined by serum protein electrophoresis 01/30/2017  No Known Allergies  Prior to Admission medications   Medication Sig Start Date End Date Taking? Authorizing Provider  amLODipine (NORVASC) 10 MG tablet Take 1 tablet (10 mg total) by mouth daily. 11/01/17   Bing Neighbors, FNP  hydroxychloroquine (PLAQUENIL) 200 MG tablet Take 2 tablets (400 mg total) by mouth daily. 11/01/17   Bing Neighbors, FNP  lisinopril-hydrochlorothiazide (PRINZIDE,ZESTORETIC) 10-12.5 MG tablet Take 1 tablet by mouth daily. 11/01/17   Bing Neighbors, FNP  Olopatadine HCl 0.2 % SOLN Apply 1 drop to eye daily. 11/01/17   Bing Neighbors, FNP  predniSONE (DELTASONE) 20 MG tablet Take 3 PO QAM x3days, 2 PO QAM x3days, 1 PO QAM x3days 11/01/17   Bing Neighbors, FNP  pregabalin (LYRICA) 100 MG  capsule Take 1 capsule (100 mg total) by mouth 2 (two) times daily. 11/02/17   Bing Neighbors, FNP  triamcinolone cream (KENALOG) 0.1 % Apply 1 application topically 2 (two) times daily. 11/01/17   Bing Neighbors, FNP    Past Medical, Surgical Family and Social History reviewed and updated.    Objective:   Today's Vitals   11/23/17 0949  BP: (!) 161/116  Pulse: (!) 101  Resp: 17  Temp: 98.2 F (36.8 C)  TempSrc: Oral  SpO2: 97%  Weight: 166 lb 12.8 oz (75.7 kg)  Height: 5\' 4"  (1.626 m)    Wt Readings from Last 3 Encounters:  11/01/17 164 lb 9.6 oz (74.7 kg)  12/31/16 149 lb 6.4 oz (67.8 kg)  10/22/16 157 lb (71.2 kg)   Physical Exam General appearance: alert, well developed, well nourished, cooperative and in no distress Head: Normocephalic, without obvious abnormality, atraumatic Respiratory: Respirations even and unlabored, normal respiratory rate Heart: rate and rhythm normal. No gallop or murmurs noted on exam  Genitourinary: Breasts are symmetric without cutaneous changes, nipple inversion or discharge. No masses or tenderness, and no axillary lymphadenopathy.Normal female external genitalia without lesion. No inguinal lymphadenopathy. Vaginal mucosa is pink and moist without lesions. Pap smear obtained. No cervical motion tenderness, adnexal fullness or tenderness. Extremities: No gross deformities Skin: Skin color, texture, turgor normal. No rashes seen  Psych: Appropriate mood and affect. Neurologic: Mental status: Alert, oriented to person, place, and time, thought content appropriate. Breasts are symmetric without cutaneous changes, nipple inversion or discharge. No masses or tenderness, and no axillary lymphadenopathy.  No results found for: POCGLU  Lab Results  Component Value Date   HGBA1C 4.8 11/02/2017     Assessment & Plan:  1. Essential hypertension, uncontrolled Discontinue Amlodipine. Continue lisinopril-HCTZ-increase dose however increase dose  to 20-25 mg once daily. Start Procardia 30 mg once daily. 2. Pap smear for cervical cancer screening 3. Encounter for well woman exam with routine gynecological exam - Urinalysis - Cytology - PAP([place])  4. Screening breast examination - MM 3D SCREEN BREAST BILATERAL. Financial assistance paperwork provided for 01/02/2018 program.    Orders Placed This Encounter  Procedures  . MM 3D SCREEN BREAST BILATERAL  . Urinalysis   Meds ordered this encounter  Medications  . NIFEdipine (PROCARDIA XL) 30 MG 24 hr tablet    Sig: Take 1 tablet (30 mg total) by mouth daily.    Dispense:  30 tablet    Refill:  2  . pregabalin (LYRICA) 100 MG capsule    Sig: Take 1 capsule (100 mg total) by mouth 2 (two) times daily.    Dispense:  60 capsule    Refill:  0  . lisinopril-hydrochlorothiazide (  PRINZIDE,ZESTORETIC) 10-12.5 MG tablet    Sig: Take 2 tablets by mouth daily.    Dispense:  60 tablet    Refill:  1    Dose change    A total of 25  minutes spent, greater than 50 % of this time was spent counseling and coordination of care.  -The patient was given clear instructions to go to ER or return to medical center if symptoms do not improve, worsen or new problems develop. The patient verbalized understanding.    Joaquin Courts, FNP [facility] [address], [place] [address] [phone]: [phone]

## 2017-11-24 ENCOUNTER — Telehealth (HOSPITAL_COMMUNITY): Payer: Self-pay | Admitting: *Deleted

## 2017-11-24 LAB — URINALYSIS
Bilirubin, UA: NEGATIVE
Glucose, UA: NEGATIVE
Leukocytes, UA: NEGATIVE
Nitrite, UA: NEGATIVE
Protein, UA: NEGATIVE
Specific Gravity, UA: 1.026 (ref 1.005–1.030)
Urobilinogen, Ur: 1 mg/dL (ref 0.2–1.0)
pH, UA: 5 (ref 5.0–7.5)

## 2017-11-24 NOTE — Telephone Encounter (Signed)
Telephoned patient, left a message to return a call to [facility]. 

## 2017-11-29 ENCOUNTER — Telehealth: Payer: Self-pay | Admitting: Family Medicine

## 2017-11-29 LAB — CYTOLOGY - PAP
Bacterial vaginitis: POSITIVE — AB
Candida vaginitis: NEGATIVE
Chlamydia: NEGATIVE
Diagnosis: NEGATIVE
Neisseria Gonorrhea: NEGATIVE

## 2017-11-29 MED ORDER — METRONIDAZOLE 500 MG PO TABS: 500.0000 mg | ORAL_TABLET | Freq: Two times a day (BID) | ORAL | 0 refills | Status: DC

## 2017-11-29 NOTE — Telephone Encounter (Signed)
Contact patient regarding abnormal labs. See lab results

## 2017-11-29 NOTE — Addendum Note (Signed)
Addended by: Bing Neighbors on: 11/29/2017 09:55 PM   Modules accepted: Orders

## 2017-11-30 MED FILL — metroNIDAZOLE 500 MG TABS: 500 | 7 days supply | Qty: 14 | Fill #0

## 2017-11-30 NOTE — Telephone Encounter (Signed)
Patient notified of lab results & recommendations. Expressed understanding. 

## 2017-11-30 NOTE — Progress Notes (Signed)
Patient notified of results & recommendations. Expressed understanding.

## 2017-12-07 ENCOUNTER — Encounter: Payer: Self-pay | Admitting: Cardiovascular Disease

## 2017-12-07 ENCOUNTER — Ambulatory Visit: Payer: Self-pay

## 2017-12-07 VITALS — BP 167/123 | HR 94 | Temp 98.5°F | Resp 18

## 2017-12-07 DIAGNOSIS — I10 Essential (primary) hypertension: Secondary | ICD-10-CM

## 2017-12-07 DIAGNOSIS — M329 Systemic lupus erythematosus, unspecified: Secondary | ICD-10-CM

## 2017-12-07 MED ORDER — LABETALOL HCL 100 MG PO TABS: 100.0000 mg | ORAL_TABLET | Freq: Two times a day (BID) | ORAL | 1 refills | Status: DC

## 2017-12-07 MED ORDER — CLONIDINE HCL 0.1 MG PO TABS
0.1000 mg | ORAL_TABLET | Freq: Once | ORAL | Status: AC
  Administered 2017-12-07: 0.1 mg via ORAL

## 2017-12-07 MED FILL — LABETALOL HCL 100 MG TABS: 100 | 30 days supply | Qty: 60 | Fill #0

## 2017-12-07 NOTE — Patient Instructions (Addendum)
I am placing referrals for you to see Rheumatology, dermatology, and cardiology (uncontrolled blood pressure).  Continue all medications and start labetalol 100 mg twice daily. I will see you back in 3 weeks for follow-up. Take all medication prior to office visit.    Hypertension Hypertension is another name for high blood pressure. High blood pressure forces your heart to work harder to pump blood. This can cause problems over time. There are two numbers in a blood pressure reading. There is a top number (systolic) over a bottom number (diastolic). It is best to have a blood pressure below 120/80. Healthy choices can help lower your blood pressure. You may need medicine to help lower your blood pressure if:  Your blood pressure cannot be lowered with healthy choices.  Your blood pressure is higher than 130/80.  Follow these instructions at home: Eating and drinking  If directed, follow the DASH eating plan. This diet includes: ? Filling half of your plate at each meal with fruits and vegetables. ? Filling one quarter of your plate at each meal with whole grains. Whole grains include whole wheat pasta, brown rice, and whole grain bread. ? Eating or drinking low-fat dairy products, such as skim milk or low-fat yogurt. ? Filling one quarter of your plate at each meal with low-fat (lean) proteins. Low-fat proteins include fish, skinless chicken, eggs, beans, and tofu. ? Avoiding fatty meat, cured and processed meat, or chicken with skin. ? Avoiding premade or processed food.  Eat less than 1,500 mg of salt (sodium) a day.  Limit alcohol use to no more than 1 drink a day for nonpregnant women and 2 drinks a day for men. One drink equals 12 oz of beer, 5 oz of wine, or 1 oz of hard liquor. Lifestyle  Work with your doctor to stay at a healthy weight or to lose weight. Ask your doctor what the best weight is for you.  Get at least 30 minutes of exercise that causes your heart to beat  faster (aerobic exercise) most days of the week. This may include walking, swimming, or biking.  Get at least 30 minutes of exercise that strengthens your muscles (resistance exercise) at least 3 days a week. This may include lifting weights or pilates.  Do not use any products that contain nicotine or tobacco. This includes cigarettes and e-cigarettes. If you need help quitting, ask your doctor.  Check your blood pressure at home as told by your doctor.  Keep all follow-up visits as told by your doctor. This is important. Medicines  Take over-the-counter and prescription medicines only as told by your doctor. Follow directions carefully.  Do not skip doses of blood pressure medicine. The medicine does not work as well if you skip doses. Skipping doses also puts you at risk for problems.  Ask your doctor about side effects or reactions to medicines that you should watch for. Contact a doctor if:  You think you are having a reaction to the medicine you are taking.  You have headaches that keep coming back (recurring).  You feel dizzy.  You have swelling in your ankles.  You have trouble with your vision. Get help right away if:  You get a very bad headache.  You start to feel confused.  You feel weak or numb.  You feel faint.  You get very bad pain in your: ? Chest. ? Belly (abdomen).  You throw up (vomit) more than once.  You have trouble breathing. Summary  Hypertension is  another name for high blood pressure.  Making healthy choices can help lower blood pressure. If your blood pressure cannot be controlled with healthy choices, you may need to take medicine. This information is not intended to replace advice given to you by your health care provider. Make sure you discuss any questions you have with your health care provider. Document Released: 06/30/2007 Document Revised: 12/10/2015 Document Reviewed: 12/10/2015 Elsevier Interactive Patient Education  [employer].

## 2017-12-07 NOTE — Progress Notes (Signed)
Patient has taken medication today and patient has eaten today.

## 2017-12-12 ENCOUNTER — Encounter: Payer: Self-pay | Admitting: Family Medicine

## 2017-12-13 NOTE — Telephone Encounter (Signed)
I was unable to contact patient no answer

## 2017-12-16 ENCOUNTER — Other Ambulatory Visit (HOSPITAL_COMMUNITY): Payer: Self-pay | Admitting: *Deleted

## 2017-12-16 DIAGNOSIS — Z1231 Encounter for screening mammogram for malignant neoplasm of breast: Secondary | ICD-10-CM

## 2017-12-21 ENCOUNTER — Ambulatory Visit: Payer: Self-pay | Admitting: Family Medicine

## 2018-01-02 ENCOUNTER — Encounter: Payer: Self-pay | Admitting: Family Medicine

## 2018-01-03 ENCOUNTER — Telehealth: Payer: Self-pay | Admitting: Family Medicine

## 2018-01-03 NOTE — Telephone Encounter (Signed)
I am the only clinical person at this office. I am rooming & doing all of the labs/meds/vaccines/etc. I have not had the time to do any referrals.

## 2018-01-03 NOTE — Telephone Encounter (Signed)
Referral packet, office note, labs & demographics faxed to [phone]. Sent patient a MyChart message informing her of this information.

## 2018-01-03 NOTE — Telephone Encounter (Signed)
Denise Carter,   Please follow-up on the referral for Ms Gruenewald to Rheumatology at [facility] and notify her of status via MyChart.  Thanks.  Joaquin Courts, FNP

## 2018-01-03 NOTE — Telephone Encounter (Signed)
I Spoke to [place] at [facility] and she told me to fax the referral notes to  [facility] location ph# [phone] Fax # [phone] and also she is going to contact Mrs Waldine to see if she is available to pay $100.

## 2018-01-03 NOTE — Telephone Encounter (Signed)
Gm Montel Clock  Can you please, follow up on these . Thank you

## 2018-01-04 NOTE — Progress Notes (Signed)
Cardiology Office Note   Date:  01/04/2018   ID:  Denise Carter, DOB 22-Sep-1970, MRN [id number]  PCP:  Bing Neighbors, FNP  Cardiologist:   Charlton Haws, MD   No chief complaint on file.     History of Present Illness: Denise Carter is a 47 y.o. female who presents for consultation regarding accelerated HTN. Referred by Joaquin Courts FNP.  History of Lupus and Thyroid disease.  She has tolerated Lisinopril/HCTZ previously on verapamil Note from priamry 11/23/17 Indicates headache with norvasc. Home readings in 150 mmHg range. She is a daily smoker BP in office 161/116 and pulse 101 Her lisinopril/HCTZ dose increased to 20/25 and started on Procardia Thyroid normal Cr .85 K 3.7 Is supposed to f/u with Rheumatology at [facility] for her Lupus  She gets care through [facility] now so is compliant with meds No chest pain dyspnea palpitations or syncope   From [place] has 7 kids only one 54 yo living with her    Past Medical History:  Diagnosis Date  . Hypertension   . Lupus (HCC)   . Thyroid disease     Past Surgical History:  Procedure Laterality Date  . ABDOMINAL HYSTERECTOMY       Current Outpatient Medications  Medication Sig Dispense Refill  . hydroxychloroquine (PLAQUENIL) 200 MG tablet Take 2 tablets (400 mg total) by mouth daily. 60 tablet 1  . labetalol (NORMODYNE) 100 MG tablet Take 1 tablet (100 mg total) by mouth 2 (two) times daily. 60 tablet 1  . lisinopril-hydrochlorothiazide (PRINZIDE,ZESTORETIC) 10-12.5 MG tablet Take 2 tablets by mouth daily. 60 tablet 1  . metroNIDAZOLE (FLAGYL) 500 MG tablet Take 1 tablet (500 mg total) by mouth 2 (two) times daily with a meal. DO NOT CONSUME ALCOHOL WHILE TAKING THIS MEDICATION. 14 tablet 0  . NIFEdipine (PROCARDIA XL) 30 MG 24 hr tablet Take 1 tablet (30 mg total) by mouth daily. 30 tablet 2  . Olopatadine HCl 0.2 % SOLN Apply 1 drop to eye daily. 2.5 mL 0  . pregabalin (LYRICA) 100 MG capsule Take 1 capsule (100 mg  total) by mouth 2 (two) times daily. 60 capsule 0  . triamcinolone cream (KENALOG) 0.1 % Apply 1 application topically 2 (two) times daily. 454 g 3   No current facility-administered medications for this visit.     Allergies:   Patient has no known allergies.    Social History:  The patient  reports that she has been smoking. She has never used smokeless tobacco. She reports that she does not drink alcohol or use drugs.   Family History:  The patient's family history is not on file.    ROS:  Please see the history of present illness.   Otherwise, review of systems are positive for none.   All other systems are reviewed and negative.    PHYSICAL EXAM: VS:  There were no vitals taken for this visit. , BMI There is no height or weight on file to calculate BMI. Affect appropriate Healthy:  appears stated age HEENT: normal Neck supple with no adenopathy JVP normal no bruits no thyromegaly Lungs clear with no wheezing and good diaphragmatic motion Heart:  S1/S2 no murmur, no rub, gallop or click PMI normal Abdomen: benighn, BS positve, no tenderness, no AAA no bruit.  No HSM or HJR Distal pulses intact with no bruits No edema Neuro non-focal Skin warm and dry No muscular weakness    EKG:   01/06/18 SR LAD no acute  changes    Recent Labs: 11/02/2017: ALT 5; BUN 7; Creatinine, Ser 0.85; Hemoglobin 12.8; Platelets 164; Potassium 3.7; Sodium 143; TSH 1.950    Lipid Panel    Component Value Date/Time   CHOL 165 11/02/2017 0843   TRIG 80 11/02/2017 0843   HDL 51 11/02/2017 0843   CHOLHDL 3.2 11/02/2017 0843   LDLCALC 98 11/02/2017 0843      Wt Readings from Last 3 Encounters:  11/23/17 166 lb 12.8 oz (75.7 kg)  11/01/17 164 lb 9.6 oz (74.7 kg)  12/31/16 149 lb 6.4 oz (67.8 kg)      Other studies Reviewed: Additional studies/ records that were reviewed today include: Notes from primary and labs .    ASSESSMENT AND PLAN:  1.  HTN:  Check renal US r/o RAS  Discussed the fact that her smoking/nicotine will make BP control hard. Increase normodyne to 300 bid since pulse still high. If this does not help can add hydralazine 50 tid. She will f/u with her primary for HTN 2. Thyroid:  Normal thyroid panel 11/02/17 3. Smoking counseled on cessation for less than 10 minutes and relationship of nicotine to HTN 4. Lupus :  F/U [facility]  Don't see lab work in [employer] She also indicates having RA. Previously on Remicade needs to get restarted on this    Current medicines are reviewed at length with the patient today.  The patient does not have concerns regarding medicines.  The following changes have been made:  Normodyne 300 bid   Labs/ tests ordered today include: Renal artery Duplex  No orders of the defined types were placed in this encounter.    Disposition:   FU with cardiology PRN We do not follow patients for just HTN This can be cared for by primary and or nephrology     Signed, Charlton Haws, MD  01/04/2018 4:31 PM    [facility] [address] [place], [place], [place]  [address] Phone: [phone]; Fax: [phone]

## 2018-01-06 ENCOUNTER — Ambulatory Visit (INDEPENDENT_AMBULATORY_CARE_PROVIDER_SITE_OTHER): Payer: Self-pay | Admitting: Cardiovascular Disease

## 2018-01-06 VITALS — HR 94 | Ht 64.0 in | Wt 164.0 lb

## 2018-01-06 DIAGNOSIS — I701 Atherosclerosis of renal artery: Secondary | ICD-10-CM

## 2018-01-06 DIAGNOSIS — I10 Essential (primary) hypertension: Secondary | ICD-10-CM

## 2018-01-06 MED ORDER — LABETALOL HCL 300 MG PO TABS: 300.0000 mg | ORAL_TABLET | Freq: Two times a day (BID) | ORAL | 3 refills | Status: DC

## 2018-01-06 MED FILL — LABETALOL HCL 300 MG TABLET: 300 | 30 days supply | Qty: 60 | Fill #0

## 2018-01-06 NOTE — Patient Instructions (Signed)
Medication Instructions:  Your physician has recommended you make the following change in your medication:  1-INCREASE Labetalol 300 mg by mouth twice daily  If you need a refill on your cardiac medications before your next appointment, please call your pharmacy.   Lab work:  If you have labs (blood work) drawn today and your tests are completely normal, you will receive your results only by: Marland Kitchen MyChart Message (if you have MyChart) OR . A paper copy in the mail If you have any lab test that is abnormal or we need to change your treatment, we will call you to review the results.  Testing/Procedures: Your physician has requested that you have a renal artery duplex. During this test, an ultrasound is used to evaluate blood flow to the kidneys. Allow one hour for this exam. Do not eat after midnight the day before and avoid carbonated beverages. Take your medications as you usually do.   Follow-Up: At [facility], you and your health needs are our priority.  As part of our continuing mission to provide you with exceptional heart care, we have created designated Provider Care Teams.  These Care Teams include your primary Cardiologist (physician) and Advanced Practice Providers (APPs -  Physician Assistants and Nurse Practitioners) who all work together to provide you with the care you need, when you need it.  Your physician recommends that you schedule a follow-up appointment as needed with Dr. Eden Emms.

## 2018-01-19 ENCOUNTER — Ambulatory Visit (INDEPENDENT_AMBULATORY_CARE_PROVIDER_SITE_OTHER): Payer: Self-pay | Admitting: Family Medicine

## 2018-01-19 VITALS — BP 130/91 | HR 84 | Temp 98.0°F | Resp 17 | Ht 64.0 in | Wt 166.9 lb

## 2018-01-19 DIAGNOSIS — J209 Acute bronchitis, unspecified: Secondary | ICD-10-CM

## 2018-01-19 DIAGNOSIS — I10 Essential (primary) hypertension: Secondary | ICD-10-CM

## 2018-01-19 MED ORDER — AZITHROMYCIN 250 MG PO TABS: ORAL_TABLET | ORAL | 0 refills | Status: DC

## 2018-01-19 MED ORDER — ALBUTEROL SULFATE HFA 108 (90 BASE) MCG/ACT IN AERS: 2.0000 | INHALATION_SPRAY | Freq: Four times a day (QID) | RESPIRATORY_TRACT | 0 refills | Status: DC | PRN

## 2018-01-19 MED ORDER — CLONIDINE HCL 0.2 MG PO TABS
0.2000 mg | ORAL_TABLET | ORAL | Status: AC
  Administered 2018-01-19: 0.2 mg via ORAL

## 2018-01-19 MED ORDER — PROMETHAZINE-CODEINE 6.25-10 MG/5ML PO SYRP: 5.0000 mL | ORAL_SOLUTION | Freq: Four times a day (QID) | ORAL | 0 refills | Status: DC | PRN

## 2018-01-19 MED ORDER — PREDNISONE 20 MG PO TABS: 40.0000 mg | ORAL_TABLET | Freq: Every day | ORAL | 0 refills | Status: AC

## 2018-01-19 MED ORDER — CLONIDINE HCL 0.1 MG PO TABS
0.1000 mg | ORAL_TABLET | Freq: Once | ORAL | Status: AC
  Administered 2018-01-19: 0.1 mg via ORAL

## 2018-01-19 NOTE — Progress Notes (Signed)
Established Patient Office Visit  Subjective:  Patient ID: Denise Carter, female    DOB: 29-Sep-1970  Age: 47 y.o. MRN: [id number]  CC:  Chief Complaint  Patient presents with  . Hypertension    HPI RHONDI NOLD presents for hypertension and 3 week respiratory infection.  Hypertension Patient recently referred for second opinion to cardiology. Medications were adjusted however BP remains severely elevated. She is negative of headaches, shortness of breath, chest pain, or new weakness.  Endorses compliance with all medications.  She is a current daily smoker. Cardiology has ordered a renal ultrasound to rule out stenosis as a cause of underlying hypertension.   Respiratory Infection  Complains of 3 weeks of URI symptoms. Continues to have a nonproductive hacking cough.  She endorses some occasional chest tightness.  No diagnosed history of COPD or asthma although endorses shortness of breath occurring with and after coughing episodes.  Endorses nasal congestion with intermittent clear nasal drainage. She has been taking multiple over-the-counter preparations without significant improvement of symptoms. Past Medical History:  Diagnosis Date  . Hypertension   . Lupus (HCC)   . Thyroid disease     Past Surgical History:  Procedure Laterality Date  . ABDOMINAL HYSTERECTOMY        Social History   Socioeconomic History  . Marital status: Married    Spouse name: Not on file  . Number of children: Not on file  . Years of education: Not on file  . Highest education level: Not on file  Occupational History  . Not on file  Social Needs  . Financial resource strain: Not on file  . Food insecurity:    Worry: Not on file    Inability: Not on file  . Transportation needs:    Medical: Not on file    Non-medical: Not on file  Tobacco Use  . Smoking status: Current Every Day Smoker  . Smokeless tobacco: Never Used  Substance and Sexual Activity  . Alcohol use: No  . Drug  use: No  . Sexual activity: Not on file  Lifestyle  . Physical activity:    Days per week: Not on file    Minutes per session: Not on file  . Stress: Not on file  Relationships  . Social connections:    Talks on phone: Not on file    Gets together: Not on file    Attends religious service: Not on file    Active member of club or organization: Not on file    Attends meetings of clubs or organizations: Not on file    Relationship status: Not on file  . Intimate partner violence:    Fear of current or ex partner: Not on file    Emotionally abused: Not on file    Physically abused: Not on file    Forced sexual activity: Not on file  Other Topics Concern  . Not on file  Social History Narrative  . Not on file    Outpatient Medications Prior to Visit  Medication Sig Dispense Refill  . hydroxychloroquine (PLAQUENIL) 200 MG tablet Take 2 tablets (400 mg total) by mouth daily. 60 tablet 1  . labetalol (NORMODYNE) 300 MG tablet Take 1 tablet (300 mg total) by mouth 2 (two) times daily. 180 tablet 3  . lisinopril-hydrochlorothiazide (PRINZIDE,ZESTORETIC) 10-12.5 MG tablet Take 2 tablets by mouth daily. 60 tablet 1  . NIFEdipine (PROCARDIA XL) 30 MG 24 hr tablet Take 1 tablet (30 mg total) by mouth daily. 30  tablet 2  . pregabalin (LYRICA) 100 MG capsule Take 1 capsule (100 mg total) by mouth 2 (two) times daily. 60 capsule 0  . triamcinolone cream (KENALOG) 0.1 % Apply 1 application topically 2 (two) times daily. 454 g 3   No facility-administered medications prior to visit.     No Known Allergies  ROS Review of Systems Pertinent negatives listed in HPI   Objective:    Physical Exam BP (!) 130/91   Pulse 84   Temp 98 F (36.7 C)   Resp 17   Ht 5\' 4"  (1.626 m)   Wt 166 lb 14.2 oz (75.7 kg)   SpO2 98%   BMI 28.65 kg/m    Wt Readings from Last 3 Encounters:  01/19/18 166 lb 14.2 oz (75.7 kg)  01/06/18 164 lb (74.4 kg)  11/23/17 166 lb 12.8 oz (75.7 kg)     Health  Maintenance Due  Topic Date Due  . HIV Screening  03/06/1984  . TETANUS/TDAP  03/06/1988  . INFLUENZA VACCINE  08/25/2017     Lab Results  Component Value Date   TSH 1.950 11/02/2017   Lab Results  Component Value Date   WBC 8.5 11/02/2017   HGB 12.8 11/02/2017   HCT 40.2 11/02/2017   MCV 88 11/02/2017   PLT 164 11/02/2017   Lab Results  Component Value Date   NA 143 11/02/2017   K 3.7 11/02/2017   CO2 20 11/02/2017   GLUCOSE 80 11/02/2017   BUN 7 11/02/2017   CREATININE 0.85 11/02/2017   BILITOT 0.3 11/02/2017   ALKPHOS 61 11/02/2017   AST 13 11/02/2017   ALT 5 11/02/2017   PROT 7.3 11/02/2017   ALBUMIN 4.0 11/02/2017   CALCIUM 9.2 11/02/2017   ANIONGAP 9 10/22/2016   Lab Results  Component Value Date   CHOL 165 11/02/2017   Lab Results  Component Value Date   HDL 51 11/02/2017   Lab Results  Component Value Date   LDLCALC 98 11/02/2017   Lab Results  Component Value Date   TRIG 80 11/02/2017   Lab Results  Component Value Date   CHOLHDL 3.2 11/02/2017   Lab Results  Component Value Date   HGBA1C 4.8 11/02/2017     Assessment & Plan:  1. Essential hypertension, uncontrolled suspect blood pressure is grossly elevated today as patient has been taking multiple doses of Sudafed and over-the-counter cough suppressant.  However treated elevated blood pressure here in office with clonidine total of 0.3 mg which reduced blood pressure.  Patient is to undergo renal artery ultrasound will await results of study prior to making changes to blood pressure medications. -Continue all medication as prescribed, encouraged smoking cessation as this is negatively affecting blood pressure control, reduce sodium if applicable.  Return in 6 weeks for blood pressure follow-up   2. Acute bronchitis, unspecified organism Advised to discontinue all over-the-counter medications as I suspect this is the reason for the grossly elevation in blood pressure reading today.   Treating with prednisone,  starting an albuterol inhaler, will also cover with antibiotic therapy with azithromycin giving the length of current URI symptoms.  Patient advised if symptoms worsen or do not improve to return for follow-up.  Meds ordered this encounter  Medications  . cloNIDine (CATAPRES) tablet 0.2 mg  . cloNIDine (CATAPRES) tablet 0.1 mg  . promethazine-codeine (PHENERGAN WITH CODEINE) 6.25-10 MG/5ML syrup    Sig: Take 5 mLs by mouth every 6 (six) hours as needed for cough.  Dispense:  120 mL    Refill:  0  . predniSONE (DELTASONE) 20 MG tablet    Sig: Take 2 tablets (40 mg total) by mouth daily with breakfast for 5 days.    Dispense:  10 tablet    Refill:  0  . azithromycin (ZITHROMAX) 250 MG tablet    Sig: Take 2 tabs PO x 1 dose, then 1 tab PO QD x 4 days    Dispense:  6 tablet    Refill:  0  . albuterol (PROVENTIL HFA;VENTOLIN HFA) 108 (90 Base) MCG/ACT inhaler    Sig: Inhale 2 puffs into the lungs every 6 (six) hours as needed for wheezing or shortness of breath.    Dispense:  1 Inhaler    Refill:  0   Follow-up: Return in 8 weeks for hypertension management  Joaquin Courts, FNP

## 2018-01-20 MED FILL — !VENTOLIN HFA INHALER: 108 (90 BAS | 25 days supply | Qty: 18 | Fill #0

## 2018-01-20 MED FILL — predniSONE 20 MG TABS: 20 | 5 days supply | Qty: 10 | Fill #0

## 2018-01-20 MED FILL — AZITHROMYCIN 250 MG TABLET: 250 | 5 days supply | Qty: 6 | Fill #0

## 2018-01-23 ENCOUNTER — Telehealth: Payer: Self-pay | Admitting: Cardiovascular Disease

## 2018-01-23 ENCOUNTER — Ambulatory Visit (HOSPITAL_COMMUNITY)
Admission: RE | Admit: 2018-01-23 | Discharge: 2018-01-23 | Disposition: A | Discharge status: Home / Self Care | Payer: Self-pay | Source: Ambulatory Visit | Attending: Cardiology | Admitting: Cardiology

## 2018-01-23 DIAGNOSIS — I701 Atherosclerosis of renal artery: Secondary | ICD-10-CM | POA: Insufficient documentation

## 2018-01-23 NOTE — Telephone Encounter (Signed)
  Patient returning call regarding results 

## 2018-01-23 NOTE — Telephone Encounter (Signed)
Called patient back with renal results.

## 2018-01-24 ENCOUNTER — Encounter: Payer: Self-pay | Admitting: Family Medicine

## 2018-01-24 MED ORDER — LISINOPRIL-HYDROCHLOROTHIAZIDE 10-12.5 MG PO TABS: 2.0000 | ORAL_TABLET | Freq: Two times a day (BID) | ORAL | 2 refills | Status: DC

## 2018-01-24 MED FILL — LISINOPRIL-HCTZ 10-12.5 MG: 10-12.5 | 30 days supply | Qty: 120 | Fill #0

## 2018-02-15 ENCOUNTER — Other Ambulatory Visit: Payer: Self-pay | Admitting: Family Medicine

## 2018-02-15 DIAGNOSIS — Z1231 Encounter for screening mammogram for malignant neoplasm of breast: Secondary | ICD-10-CM

## 2018-02-24 ENCOUNTER — Other Ambulatory Visit: Payer: Self-pay

## 2018-03-01 ENCOUNTER — Encounter: Payer: Self-pay | Admitting: Family Medicine

## 2018-03-01 ENCOUNTER — Other Ambulatory Visit: Payer: Self-pay | Admitting: Family Medicine

## 2018-03-01 DIAGNOSIS — M329 Systemic lupus erythematosus, unspecified: Secondary | ICD-10-CM

## 2018-03-01 NOTE — Progress Notes (Signed)
Referral placed to [facility] for management of Lupus.

## 2018-03-06 NOTE — Addendum Note (Signed)
Addended by: Bing Neighbors on: 03/06/2018 09:08 AM   Modules accepted: Orders

## 2018-03-06 NOTE — Progress Notes (Signed)
[facility].  [facility] Please fax all office notes and cardiology notes dated back to 11/01/2017. Patient now has health insurance coverage. Contact patient to obtain health insurance information.

## 2018-03-08 ENCOUNTER — Ambulatory Visit (INDEPENDENT_AMBULATORY_CARE_PROVIDER_SITE_OTHER): Payer: Managed Care, Other (non HMO) | Admitting: Family Medicine

## 2018-03-08 ENCOUNTER — Encounter: Payer: Self-pay | Admitting: Family Medicine

## 2018-03-08 VITALS — BP 160/112 | HR 91 | Resp 17 | Ht 64.0 in | Wt 165.6 lb

## 2018-03-08 DIAGNOSIS — I10 Essential (primary) hypertension: Secondary | ICD-10-CM

## 2018-03-08 DIAGNOSIS — M329 Systemic lupus erythematosus, unspecified: Secondary | ICD-10-CM

## 2018-03-08 MED ORDER — PREDNISONE 10 MG PO TABS: 10.0000 mg | ORAL_TABLET | Freq: Two times a day (BID) | ORAL | 2 refills | Status: DC

## 2018-03-08 MED ORDER — LABETALOL HCL 300 MG PO TABS: 300.0000 mg | ORAL_TABLET | Freq: Three times a day (TID) | ORAL | 3 refills | Status: DC

## 2018-03-08 MED ORDER — PREGABALIN 100 MG PO CAPS: 100.0000 mg | ORAL_CAPSULE | Freq: Two times a day (BID) | ORAL | 3 refills | Status: DC

## 2018-03-08 NOTE — Progress Notes (Signed)
Established Patient Office Visit  Subjective:  Patient ID: Denise Carter, female    DOB: 1970/08/10  Age: 48 y.o. MRN: [id number]  CC:  Chief Complaint  Patient presents with  . Hypertension    HPI Denise SongChiquita R Montour presents for blood pressure follow-up.Blood pressure on arrival elevated at 160/112. Reports no home monitoring of blood pressure. Current daily smoker and endorses heavy caffeine intake especially since returning back to work. Reports adherence to blood pressure medications. Recent renal ultrasound was normal. Reports efforts to adhere to low sodium diet.Denies any episodes of dizziness,headaches, shortness of breath, or chest pain. She is awaiting a new referral for rheumatology. Previously referred to [facility] [place], however this location is inconvenient since she has returned to work and commutes over 1 hour away daily. She has since been referred [facility].  Past Medical History:  Diagnosis Date  . Hypertension   . Lupus (HCC)   . Thyroid disease     Past Surgical History:  Procedure Laterality Date  . ABDOMINAL HYSTERECTOMY      No family history on file.  Social History   Socioeconomic History  . Marital status: Married    Spouse name: Not on file  . Number of children: Not on file  . Years of education: Not on file  . Highest education level: Not on file  Occupational History  . Not on file  Social Needs  . Financial resource strain: Not on file  . Food insecurity:    Worry: Not on file    Inability: Not on file  . Transportation needs:    Medical: Not on file    Non-medical: Not on file  Tobacco Use  . Smoking status: Current Every Day Smoker  . Smokeless tobacco: Never Used  Substance and Sexual Activity  . Alcohol use: No  . Drug use: No  . Sexual activity: Not on file  Lifestyle  . Physical activity:    Days per week: Not on file    Minutes per session: Not on file  . Stress: Not on file  Relationships   . Social connections:    Talks on phone: Not on file    Gets together: Not on file    Attends religious service: Not on file    Active member of club or organization: Not on file    Attends meetings of clubs or organizations: Not on file    Relationship status: Not on file  . Intimate partner violence:    Fear of current or ex partner: Not on file    Emotionally abused: Not on file    Physically abused: Not on file    Forced sexual activity: Not on file  Other Topics Concern  . Not on file  Social History Narrative  . Not on file    Outpatient Medications Prior to Visit  Medication Sig Dispense Refill  . albuterol (PROVENTIL HFA;VENTOLIN HFA) 108 (90 Base) MCG/ACT inhaler Inhale 2 puffs into the lungs every 6 (six) hours as needed for wheezing or shortness of breath. 1 Inhaler 0  . folic acid (FOLVITE) 1 MG tablet Take 1 mg by mouth daily.    . hydroxychloroquine (PLAQUENIL) 200 MG tablet Take 2 tablets (400 mg total) by mouth daily. 60 tablet 1  . lisinopril-hydrochlorothiazide (PRINZIDE,ZESTORETIC) 10-12.5 MG tablet Take 2 tablets by mouth 2 (two) times daily. 120 tablet 2  . Methotrexate, PF, 10 MG/0.2ML SOAJ Inject 1 Dose into the skin once a week.    .Marland Kitchen  nebivolol (BYSTOLIC) 10 MG tablet Take 10 mg by mouth daily.    Marland Kitchen. NIFEdipine (PROCARDIA XL) 30 MG 24 hr tablet Take 1 tablet (30 mg total) by mouth daily. 30 tablet 2  . tapentadol (NUCYNTA) 50 MG 12 hr tablet Take 50 mg by mouth every 12 (twelve) hours.    . triamcinolone cream (KENALOG) 0.1 % Apply 1 application topically 2 (two) times daily. 454 g 3  . verapamil (CALAN-SR) 180 MG CR tablet Take 180 mg by mouth daily.    Marland Kitchen. labetalol (NORMODYNE) 300 MG tablet Take 1 tablet (300 mg total) by mouth 2 (two) times daily. 180 tablet 3  . predniSONE (DELTASONE) 5 MG tablet Take 5 mg by mouth 2 (two) times daily.    . pregabalin (LYRICA) 100 MG capsule Take 1 capsule (100 mg total) by mouth 2 (two) times daily. 60 capsule 0   No  facility-administered medications prior to visit.     No Known Allergies  ROS Review of Systems Pertinent negatives listed in HPI  Objective:    Physical Exam BP (!) 160/112   Pulse 91   Resp 17   Ht 5\' 4"  (1.626 m)   Wt 165 lb 9.6 oz (75.1 kg)   SpO2 98%   BMI 28.43 kg/m   General appearance: alert, well developed, well nourished, cooperative and in no distress Head: Normocephalic, without obvious abnormality, atraumatic Respiratory: Respirations even and unlabored, normal respiratory rate Heart: regular rhythm and rate. No murmur or gallops  Extremities: No gross deformities Skin: Skin color, texture, turgor normal. No rashes seen  Psych: Appropriate mood and affect. Neurologic: Mental status: Alert, oriented to person, place, and time, thought content appropriate.  Wt Readings from Last 3 Encounters:  03/08/18 165 lb 9.6 oz (75.1 kg)  01/19/18 166 lb 14.2 oz (75.7 kg)  01/06/18 164 lb (74.4 kg)     Health Maintenance Due  Topic Date Due  . HIV Screening  03/06/1984  . TETANUS/TDAP  03/06/1988    There are no preventive care reminders to display for this patient.  Lab Results  Component Value Date   TSH 1.950 11/02/2017   Lab Results  Component Value Date   WBC 8.5 11/02/2017   HGB 12.8 11/02/2017   HCT 40.2 11/02/2017   MCV 88 11/02/2017   PLT 164 11/02/2017   Lab Results  Component Value Date   NA 143 11/02/2017   K 3.7 11/02/2017   CO2 20 11/02/2017   GLUCOSE 80 11/02/2017   BUN 7 11/02/2017   CREATININE 0.85 11/02/2017   BILITOT 0.3 11/02/2017   ALKPHOS 61 11/02/2017   AST 13 11/02/2017   ALT 5 11/02/2017   PROT 7.3 11/02/2017   ALBUMIN 4.0 11/02/2017   CALCIUM 9.2 11/02/2017   ANIONGAP 9 10/22/2016   Lab Results  Component Value Date   CHOL 165 11/02/2017   Lab Results  Component Value Date   HDL 51 11/02/2017   Lab Results  Component Value Date   LDLCALC 98 11/02/2017   Lab Results  Component Value Date   TRIG 80 11/02/2017    Lab Results  Component Value Date   CHOLHDL 3.2 11/02/2017   Lab Results  Component Value Date   HGBA1C 4.8 11/02/2017      Assessment & Plan:  1. Lupus [facility](HCC) Referral placed to [facility]   2. Essential hypertension Increasing labetalol (NORMODYNE) 300 MG tablet; Take 1 tablet (300 mg total) by mouth 3 (three) times daily.   Continue  Lisinopril-HCTZ, and  Nifedipine If no improvement will increase nifedipine   Meds ordered this encounter  Medications  . predniSONE (DELTASONE) 10 MG tablet    Sig: Take 1 tablet (10 mg total) by mouth 2 (two) times daily.    Dispense:  60 tablet    Refill:  2  . labetalol (NORMODYNE) 300 MG tablet    Sig: Take 1 tablet (300 mg total) by mouth 3 (three) times daily.    Dispense:  180 tablet    Refill:  3  . pregabalin (LYRICA) 100 MG capsule    Sig: Take 1 capsule (100 mg total) by mouth 2 (two) times daily.    Dispense:  60 capsule    Refill:  3    Follow-up: Return in about 3 months (around 06/06/2018) for hypertension.    Joaquin Courts, FNP

## 2018-03-08 NOTE — Patient Instructions (Signed)

## 2018-03-15 ENCOUNTER — Ambulatory Visit: Payer: Self-pay

## 2018-03-16 ENCOUNTER — Ambulatory Visit (HOSPITAL_COMMUNITY): Payer: Self-pay

## 2018-04-11 ENCOUNTER — Ambulatory Visit (HOSPITAL_COMMUNITY)
Admission: EM | Admit: 2018-04-11 | Discharge: 2018-04-11 | Disposition: A | Discharge status: Home / Self Care | Payer: 59 | Attending: Family Medicine | Admitting: Family Medicine

## 2018-04-11 ENCOUNTER — Encounter (HOSPITAL_COMMUNITY): Payer: Self-pay | Admitting: Emergency Medicine

## 2018-04-11 DIAGNOSIS — I10 Essential (primary) hypertension: Secondary | ICD-10-CM | POA: Diagnosis not present

## 2018-04-11 DIAGNOSIS — M654 Radial styloid tenosynovitis [de Quervain]: Secondary | ICD-10-CM | POA: Diagnosis not present

## 2018-04-11 MED ORDER — KETOROLAC TROMETHAMINE 60 MG/2ML IM SOLN
60.0000 mg | Freq: Once | INTRAMUSCULAR | Status: AC
  Administered 2018-04-11: 60 mg via INTRAMUSCULAR

## 2018-04-11 MED ORDER — KETOROLAC TROMETHAMINE 60 MG/2ML IM SOLN
INTRAMUSCULAR | Status: AC
  Filled 2018-04-11: qty 2

## 2018-04-11 NOTE — ED Triage Notes (Signed)
Pt here for right wrist and hand pain from tendinitis

## 2018-04-11 NOTE — Discharge Instructions (Addendum)
I believe your symptoms are related to tendinitis We will give you a shot of Toradol here to help with pain inflammation Keep taking your prednisone that is prescribed daily Wear the splint Follow up as needed for continued or worsening symptoms

## 2018-04-11 NOTE — ED Provider Notes (Addendum)
[facility]    CSN: [id number] Arrival date & time: 04/11/18  1008     History   Chief Complaint Chief Complaint  Patient presents with  . Hand Pain    HPI Denise Carter is a 48 y.o. female.   Pt is a 48 year old female with past medical history of lupus, hypertension, thyroid disease, RA  that presents with right wrist, thumb pain.  This has been waxing and waning but worsening over the past week.  Reporting that when she wakes up the morning her thumb is locked up.  She does repetitive movements at work folding clothes .  She has had similar problems in the past.  She has not been taking anything  for the pain.  She takes prednisone 10 mg daily for chronic medical conditions.  Denies any numbness, tingling.  No injuries.  ROS per HPI      Past Medical History:  Diagnosis Date  . Hypertension   . Lupus (HCC)   . Thyroid disease     Patient Active Problem List   Diagnosis Date Noted  . Polyclonal gammopathy determined by serum protein electrophoresis 01/30/2017    Past Surgical History:  Procedure Laterality Date  . ABDOMINAL HYSTERECTOMY      OB History   No obstetric history on file.      Home Medications    Prior to Admission medications   Medication Sig Start Date End Date Taking? Authorizing Provider  albuterol (PROVENTIL HFA;VENTOLIN HFA) 108 (90 Base) MCG/ACT inhaler Inhale 2 puffs into the lungs every 6 (six) hours as needed for wheezing or shortness of breath. 01/19/18   Bing NeighborsHarris, Kimberly S, FNP  folic acid (FOLVITE) 1 MG tablet Take 1 mg by mouth daily.    [provider]  hydroxychloroquine (PLAQUENIL) 200 MG tablet Take 2 tablets (400 mg total) by mouth daily. 11/01/17   Bing NeighborsHarris, Kimberly S, FNP  labetalol (NORMODYNE) 300 MG tablet Take 1 tablet (300 mg total) by mouth 3 (three) times daily. 03/08/18   Bing NeighborsHarris, Kimberly S, FNP  lisinopril-hydrochlorothiazide (PRINZIDE,ZESTORETIC) 10-12.5 MG tablet Take 2 tablets by mouth 2  (two) times daily. 01/24/18 04/24/18  Bing NeighborsHarris, Kimberly S, FNP  Methotrexate, PF, 10 MG/0.2ML SOAJ Inject 1 Dose into the skin once a week.    [provider]  nebivolol (BYSTOLIC) 10 MG tablet Take 10 mg by mouth daily.    [provider]  NIFEdipine (PROCARDIA XL) 30 MG 24 hr tablet Take 1 tablet (30 mg total) by mouth daily. 11/23/17   Bing NeighborsHarris, Kimberly S, FNP  predniSONE (DELTASONE) 10 MG tablet Take 1 tablet (10 mg total) by mouth 2 (two) times daily. 03/08/18   Bing NeighborsHarris, Kimberly S, FNP  pregabalin (LYRICA) 100 MG capsule Take 1 capsule (100 mg total) by mouth 2 (two) times daily. 03/08/18   Bing NeighborsHarris, Kimberly S, FNP  tapentadol (NUCYNTA) 50 MG 12 hr tablet Take 50 mg by mouth every 12 (twelve) hours.    [provider]  triamcinolone cream (KENALOG) 0.1 % Apply 1 application topically 2 (two) times daily. 11/01/17   Bing NeighborsHarris, Kimberly S, FNP  verapamil (CALAN-SR) 180 MG CR tablet Take 180 mg by mouth daily.    [provider]    Family History History reviewed. No pertinent family history.  Social History Social History   Tobacco Use  . Smoking status: Current Every Day Smoker  . Smokeless tobacco: Never Used  Substance Use Topics  . Alcohol use: No  . Drug  use: No     Allergies   Patient has no known allergies.   Review of Systems Review of Systems   Physical Exam Triage Vital Signs ED Triage Vitals  Enc Vitals Group     BP 04/11/18 1053 (!) 164/110     Pulse Rate 04/11/18 1053 92     Resp 04/11/18 1053 18     Temp 04/11/18 1053 97.6 F (36.4 C)     Temp Source 04/11/18 1053 Oral     SpO2 04/11/18 1053 97 %     Weight --      Height --      Head Circumference --      Peak Flow --      Pain Score 04/11/18 1054 8     Pain Loc --      Pain Edu? --      Excl. in GC? --    No data found.  Updated Vital Signs BP (!) 164/110 (BP Location: Left Arm)   Pulse 92   Temp 97.6 F (36.4 C) (Oral)   Resp 18   SpO2 97%   Visual Acuity  Right Eye Distance:   Left Eye Distance:   Bilateral Distance:    Right Eye Near:   Left Eye Near:    Bilateral Near:     Physical Exam Vitals signs and nursing note reviewed.  Constitutional:      General: She is not in acute distress.    Appearance: Normal appearance. She is not ill-appearing, toxic-appearing or diaphoretic.  HENT:     Head: Normocephalic.     Nose: Nose normal.     Mouth/Throat:     Pharynx: Oropharynx is clear.  Eyes:     Conjunctiva/sclera: Conjunctivae normal.  Neck:     Musculoskeletal: Normal range of motion.  Pulmonary:     Effort: Pulmonary effort is normal.  Musculoskeletal: Normal range of motion.        General: Tenderness present. No deformity or signs of injury.     Comments: Right wrist tenderness with radiation into right thumb.  Limited range of motion due to pain. Positive Finkelstein. Good pulse with normal sensation and temperature.  Skin:    General: Skin is warm and dry.     Findings: No rash.  Neurological:     Mental Status: She is alert.  Psychiatric:        Mood and Affect: Mood normal.      UC Treatments / Results  Labs (all labs ordered are listed, but only abnormal results are displayed) Labs Reviewed - No data to display  EKG None  Radiology No results found.  Procedures Procedures (including critical care time)  Medications Ordered in UC Medications  ketorolac (TORADOL) injection 60 mg (60 mg Intramuscular Given 04/11/18 1134)    Initial Impression / Assessment and Plan / UC Course  I have reviewed the triage vital signs and the nursing notes.  Pertinent labs & imaging results that were available during my care of the patient were reviewed by me and considered in my medical decision making (see chart for details).     De Quervain's tendinitis  We will treat with thumb spica splint and Toradol injection here in clinic She is already taking prednisone daily.  We will have her continue this. Follow up  as needed for continued or worsening symptoms with PCP   Final Clinical Impressions(s) / UC Diagnoses   Final diagnoses:  Tendinitis, de Quervain's     Discharge  Instructions     I believe your symptoms are related to tendinitis We will give you a shot of Toradol here to help with pain inflammation Keep taking your prednisone that is prescribed daily Wear the splint Follow up as needed for continued or worsening symptoms     ED Prescriptions    None     Controlled Substance Prescriptions [place] Controlled Substance Registry consulted? Not Applicable       Janace Aris, NP 04/11/18 1135

## 2018-04-22 ENCOUNTER — Encounter: Payer: Self-pay | Admitting: Family Medicine

## 2018-04-23 ENCOUNTER — Encounter: Payer: Self-pay | Admitting: Family Medicine

## 2018-04-24 ENCOUNTER — Other Ambulatory Visit: Payer: Self-pay | Admitting: Family Medicine

## 2018-04-24 DIAGNOSIS — M659 Synovitis and tenosynovitis, unspecified: Secondary | ICD-10-CM

## 2018-04-24 MED ORDER — PREDNISONE 20 MG PO TABS: ORAL_TABLET | ORAL | 0 refills | Status: DC

## 2018-04-24 NOTE — Progress Notes (Signed)
Orders for prednisone taper for wrist pain and a referral placed to orthopedics.

## 2018-04-27 ENCOUNTER — Encounter: Payer: Self-pay | Admitting: Family Medicine

## 2018-04-28 ENCOUNTER — Encounter: Payer: Self-pay | Admitting: Family Medicine

## 2018-05-01 NOTE — Telephone Encounter (Signed)
Letter faxed.

## 2018-05-04 ENCOUNTER — Encounter (INDEPENDENT_AMBULATORY_CARE_PROVIDER_SITE_OTHER): Payer: Self-pay | Admitting: Orthopaedic Surgery

## 2018-05-04 ENCOUNTER — Ambulatory Visit (INDEPENDENT_AMBULATORY_CARE_PROVIDER_SITE_OTHER): Payer: 59

## 2018-05-04 ENCOUNTER — Other Ambulatory Visit: Payer: Self-pay

## 2018-05-04 ENCOUNTER — Ambulatory Visit (INDEPENDENT_AMBULATORY_CARE_PROVIDER_SITE_OTHER): Payer: 59 | Admitting: Orthopaedic Surgery

## 2018-05-04 VITALS — BP 205/127 | HR 82 | Ht 64.0 in | Wt 168.0 lb

## 2018-05-04 DIAGNOSIS — M79641 Pain in right hand: Secondary | ICD-10-CM

## 2018-05-04 DIAGNOSIS — M654 Radial styloid tenosynovitis [de Quervain]: Secondary | ICD-10-CM

## 2018-05-04 MED ORDER — BUPIVACAINE HCL 0.5 % IJ SOLN
0.6600 mL | INTRAMUSCULAR | Status: AC | PRN
  Administered 2018-05-04: .66 mL

## 2018-05-04 MED ORDER — LIDOCAINE HCL 1 % IJ SOLN
0.5000 mL | INTRAMUSCULAR | Status: AC | PRN
  Administered 2018-05-04: .5 mL

## 2018-05-04 MED ORDER — METHYLPREDNISOLONE ACETATE 40 MG/ML IJ SUSP
20.0000 mg | INTRAMUSCULAR | Status: AC | PRN
  Administered 2018-05-04: 20 mg

## 2018-05-04 NOTE — Progress Notes (Signed)
Office Visit Note   Patient: Denise Carter           Date of Birth: 04/08/1970           MRN: [id number] Visit Date: 05/04/2018              Requested by: Bing Neighbors, FNP [address] [place], [place] [address] PCP: Bing Neighbors, FNP   Assessment & Plan: Visit Diagnoses:  1. Pain in right hand   2. De Quervain's disease (radial styloid tenosynovitis)   3.      Possible TFCC tear  Plan:  #1: Steroid injection was placed in the first dorsal compartment by Dr. Cleophas Dunker.  She did have a relief in her pain at that area. #2: She does still have pain at the TFCC area.  Pain with supination.  Therefore we will see how she does with the injection to the first dorsal compartment.  If she continues to have pain at the TFCC she can return and we can inject that also.  Follow-Up Instructions: Return if symptoms worsen or fail to improve.   Orders:  Orders Placed This Encounter  Procedures  . XR Hand Complete Right   No orders of the defined types were placed in this encounter.     Procedures: Hand/UE Inj: R extensor compartment 1 for de Quervain's tenosynovitis on 05/04/2018 9:28 AM Indications: diagnostic and pain Details: 25 G needle, radial approach Medications: 0.5 mL lidocaine 1 %; 0.66 mL bupivacaine 0.5 %; 20 mg methylPREDNISolone acetate 40 MG/ML Procedure, treatment alternatives, risks and benefits explained, specific risks discussed. Consent was given by the patient. Patient was prepped and draped in the usual sterile fashion.       Clinical Data: No additional findings.   Subjective: Chief Complaint  Patient presents with  . Right Wrist - Pain   HPI: Patient presents today for right wrist pain. She said that it has been hurting for 3 weeks, with no injury. She said that it hurts throughout her wrist and into her thumb. She went to [facility] on 04/11/18 and was given a brace to wear for tendonitis as well as a shot of  Toradol.. She said that the brace does not help and causes more pain. She is not wearing it today. She said that she has swelling and constant pain. The pain is worse if she uses her hand for anything. She does not take anything for pain. Right hand dominant.  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  Pain at the radial aspect of the distal radius as well as at the ulnar joint.    Review of Systems  Constitutional: Positive for fatigue.  HENT: Negative for ear pain.   Eyes: Negative for pain.  Respiratory: Negative for shortness of breath.   Cardiovascular: Negative for leg swelling.  Gastrointestinal: Negative for constipation and diarrhea.  Endocrine: Positive for cold intolerance and heat intolerance.  Genitourinary: Negative for difficulty urinating.  Musculoskeletal: Positive for joint swelling.  Skin: Negative for rash.  Allergic/Immunologic: Negative for food allergies.  Neurological: Negative for weakness.  Hematological: Does not bruise/bleed easily.  Psychiatric/Behavioral: Negative for sleep disturbance.     Objective: Vital Signs: BP (!) 205/127   Pulse 82   Ht 5\' 4"  (1.626 m)   Wt 168 lb (76.2 kg)   BMI 28.84 kg/m   Physical Exam Constitutional:      Appearance: She is well-developed.  Eyes:  Pupils: Pupils are equal, round, and reactive to light.  Pulmonary:     Effort: Pulmonary effort is normal.  Skin:    General: Skin is warm and dry.  Neurological:     Mental Status: She is alert and oriented to person, place, and time.  Psychiatric:        Behavior: Behavior normal.     Ortho Exam  Today she has tenderness over the TFCC as well as the styloid.  She does have positive Finkelstein test.  She also has some pain and lacks full supination.  She is able to grip without problems.  Got good capillary refill.  Good radial pulse.  Sensation is intact to light touch.  Specialty Comments:  No specialty comments available.  Imaging:  No results found.   PMFS History: Patient Active Problem List   Diagnosis Date Noted  . Polyclonal gammopathy determined by serum protein electrophoresis 01/30/2017   Past Medical History:  Diagnosis Date  . Hypertension   . Lupus (HCC)   . Thyroid disease     History reviewed. No pertinent family history.  Past Surgical History:  Procedure Laterality Date  . ABDOMINAL HYSTERECTOMY     Social History   Occupational History  . Not on file  Tobacco Use  . Smoking status: Current Every Day Smoker  . Smokeless tobacco: Never Used  Substance and Sexual Activity  . Alcohol use: No  . Drug use: No  . Sexual activity: Not on file

## 2018-05-16 ENCOUNTER — Telehealth (INDEPENDENT_AMBULATORY_CARE_PROVIDER_SITE_OTHER): Payer: Self-pay | Admitting: Orthopaedic Surgery

## 2018-05-16 NOTE — Telephone Encounter (Signed)
Patient states injection she got when she was here last did not help at all. Patient is wearing brace, but it seems it is actually worse now than before. Patient would like to know if she needs to come back in for another appt, or what next step may be? Please call to advise.

## 2018-05-17 NOTE — Telephone Encounter (Signed)
Please advise 

## 2018-05-17 NOTE — Telephone Encounter (Signed)
MRI arthrogram of that wrist

## 2018-05-25 ENCOUNTER — Other Ambulatory Visit (INDEPENDENT_AMBULATORY_CARE_PROVIDER_SITE_OTHER): Payer: Self-pay | Admitting: Orthopedic Surgery

## 2018-05-25 ENCOUNTER — Other Ambulatory Visit: Payer: Self-pay | Admitting: *Deleted

## 2018-05-25 ENCOUNTER — Telehealth: Payer: Self-pay | Admitting: *Deleted

## 2018-05-25 DIAGNOSIS — M25531 Pain in right wrist: Secondary | ICD-10-CM

## 2018-05-25 MED ORDER — TRAMADOL HCL 50 MG PO TABS: 50.0000 mg | ORAL_TABLET | Freq: Four times a day (QID) | ORAL | 0 refills | Status: DC | PRN

## 2018-05-25 NOTE — Telephone Encounter (Signed)
I called patient, ordered as URGENT arthrogram of right wrist.

## 2018-05-25 NOTE — Telephone Encounter (Signed)
Patient called with severe right wrist pain. Patient stated she was sent home from work due to her pain. Requesting pain meds. MR Arthrogram will be ordered as URGENT.

## 2018-06-06 ENCOUNTER — Ambulatory Visit: Payer: Managed Care, Other (non HMO) | Admitting: Family Medicine

## 2018-06-07 ENCOUNTER — Telehealth (INDEPENDENT_AMBULATORY_CARE_PROVIDER_SITE_OTHER): Payer: 59 | Admitting: Family Medicine

## 2018-06-07 DIAGNOSIS — J302 Other seasonal allergic rhinitis: Secondary | ICD-10-CM

## 2018-06-07 DIAGNOSIS — I10 Essential (primary) hypertension: Secondary | ICD-10-CM

## 2018-06-07 MED ORDER — IPRATROPIUM BROMIDE 0.03 % NA SOLN: 2.0000 | Freq: Two times a day (BID) | NASAL | 0 refills | Status: DC

## 2018-06-07 MED ORDER — LEVOCETIRIZINE DIHYDROCHLORIDE 5 MG PO TABS: 5.0000 mg | ORAL_TABLET | Freq: Every evening | ORAL | 5 refills | Status: DC

## 2018-06-07 NOTE — Progress Notes (Signed)
Virtual Visit via Video Note  I connected with Denise Carter on 06/07/18 at 10:10 AM EDT by a video enabled telemedicine application and verified that I am speaking with the correct person using two identifiers.  Location: Patient: Located at home during today's encounter  Provider: Located at primary care office   I discussed the limitations of evaluation and management by telemedicine and the availability of in person appointments. The patient expressed understanding and agreed to proceed.  History of Present Illness: Hypertension follow-up Home BP readings have continues to fluctuate, however, remain greater than 140/90. Patient has been seen by cardiology for accelerated hypertension and medications were adjusted. Last office visit, blood pressure readings had significantly improved. Checked her blood pressure today and reports a reading 200/100. She has not taken any medication. She continues to smoke. Denies chest pain, shortness of breath, weakness, or dizziness.   Seasonal allergies  Continues to experience nasal congestion.  Relates symptoms to pollen. An occasional cough, although not worrisome. Throat is dry and itching with waking up in the morning. Currently is not taking any antihistamine medication.    Assessment and Plan: 1. Accelerated hypertension -Take medication consistently as prescribed. If home readings remain >160/90, schedule follow-up here in office sooner 2. Seasonal allergies Trial Cetrizine 5 mg once daily and Atrovent nasal spray 2 sprays twice daily as needed   Meds ordered this encounter  Medications  . ipratropium (ATROVENT) 0.03 % nasal spray    Sig: Place 2 sprays into both nostrils 2 (two) times daily.    Dispense:  30 mL    Refill:  0  . levocetirizine (XYZAL) 5 MG tablet    Sig: Take 1 tablet (5 mg total) by mouth every evening.    Dispense:  30 tablet    Refill:  5      Follow Up Instructions: 07/12/18 Hypertension follow-up    I  discussed the assessment and treatment plan with the patient. The patient was provided an opportunity to ask questions and all were answered. The patient agreed with the plan and demonstrated an understanding of the instructions.   The patient was advised to call back or seek an in-person evaluation if the symptoms worsen or if the condition fails to improve as anticipated.  I provided 25 minutes of non-face-to-face time during this encounter.   Joaquin Courts, FNP

## 2018-06-09 ENCOUNTER — Other Ambulatory Visit: Payer: Self-pay | Admitting: Orthopaedic Surgery

## 2018-06-12 ENCOUNTER — Ambulatory Visit
Admission: RE | Admit: 2018-06-12 | Discharge: 2018-06-12 | Disposition: A | Discharge status: Home / Self Care | Payer: 59 | Source: Ambulatory Visit | Attending: Orthopaedic Surgery | Admitting: Orthopaedic Surgery

## 2018-06-12 ENCOUNTER — Other Ambulatory Visit: Payer: Self-pay

## 2018-06-12 DIAGNOSIS — M25531 Pain in right wrist: Secondary | ICD-10-CM

## 2018-06-12 MED ORDER — IOPAMIDOL (ISOVUE-M 200) INJECTION 41%
15.0000 mL | Freq: Once | INTRAMUSCULAR | Status: AC
  Administered 2018-06-12: 15:00:00 1.5 mL via INTRA_ARTICULAR

## 2018-06-14 ENCOUNTER — Encounter: Payer: Self-pay | Admitting: Orthopaedic Surgery

## 2018-06-20 ENCOUNTER — Other Ambulatory Visit: Payer: Self-pay

## 2018-06-20 ENCOUNTER — Ambulatory Visit: Payer: 59 | Admitting: Orthopaedic Surgery

## 2018-06-20 ENCOUNTER — Encounter: Payer: Self-pay | Admitting: Orthopaedic Surgery

## 2018-06-20 VITALS — BP 192/122 | HR 86 | Ht 64.0 in | Wt 168.0 lb

## 2018-06-20 DIAGNOSIS — M79641 Pain in right hand: Secondary | ICD-10-CM | POA: Diagnosis not present

## 2018-06-20 MED ORDER — LIDOCAINE HCL 1 % IJ SOLN
1.0000 mL | INTRAMUSCULAR | Status: AC | PRN
  Administered 2018-06-20: 1 mL

## 2018-06-20 MED ORDER — METHYLPREDNISOLONE ACETATE 40 MG/ML IJ SUSP
20.0000 mg | INTRAMUSCULAR | Status: AC | PRN
  Administered 2018-06-20: 20 mg

## 2018-06-20 NOTE — Progress Notes (Signed)
Office Visit Note   Patient: Denise Carter           Date of Birth: 01-26-1970           MRN: [id number] Visit Date: 06/20/2018              Requested by: Bing Neighbors, FNP [address] [place], [place] [address] PCP: Bing Neighbors, FNP   Assessment & Plan: Visit Diagnoses:  1. Pain of right hand     Plan: MRI arthrogram of right wrist demonstrated some arthritis at the base of the right thumb.  No evidence of de Quervain's or any pathology referable to the [facility].  I injected the base of the thumb and she did feel better.  Prior injection into the first dorsal extensor compartment did not help.  We will continue with her splint.  Taking Enbrel for her rheumatoid arthritis.  We will plan to see back as needed  Follow-Up Instructions: Return if symptoms worsen or fail to improve.   Orders:  Orders Placed This Encounter  Procedures  . Hand/UE Inj: R thumb CMC   No orders of the defined types were placed in this encounter.     Procedures: Hand/UE Inj: R thumb CMC for osteoarthritis on 06/20/2018 4:11 PM Medications: 1 mL lidocaine 1 %; 20 mg methylPREDNISolone acetate 40 MG/ML Outcome: tolerated well, no immediate complications      Clinical Data: No additional findings.   Subjective: Chief Complaint  Patient presents with  . Right Wrist - Follow-up  Patient presents today for follow up on her right wrist. She had an MRI on 06/12/18 and is here today for the results. She is wearing a brace and states that her wrist is still hurting. She received a cortisone injection on 05/04/18 and did not see any improvement.  HPI  Review of Systems  Constitutional: Positive for fatigue.  HENT: Negative for ear pain.   Eyes: Negative for pain.  Respiratory: Negative for shortness of breath.   Cardiovascular: Negative for leg swelling.  Gastrointestinal: Negative for constipation and diarrhea.  Endocrine: Negative for cold intolerance and heat intolerance.   Genitourinary: Negative for difficulty urinating.  Musculoskeletal: Positive for joint swelling.  Skin: Negative for rash.  Allergic/Immunologic: Negative for food allergies.  Neurological: Positive for weakness.  Hematological: Does not bruise/bleed easily.  Psychiatric/Behavioral: Positive for sleep disturbance.     Objective: Vital Signs: BP (!) 192/122   Pulse 86   Ht 5\' 4"  (1.626 m)   Wt 168 lb (76.2 kg)   BMI 28.84 kg/m   Physical Exam Constitutional:      Appearance: She is well-developed.  Eyes:     Pupils: Pupils are equal, round, and reactive to light.  Pulmonary:     Effort: Pulmonary effort is normal.  Skin:    General: Skin is warm and dry.  Neurological:     Mental Status: She is alert and oriented to person, place, and time.  Psychiatric:        Behavior: Behavior normal.     Ortho Exam right hand with tenderness at the base of the thumb with a positive grind test.  No pain in the dorsum of the wrist or the radiocarpal joint.  Negative de Quervain's or Finkelstein's test.  Good grip and good release.  Neurologically intact.  No pain over the median nerve  Specialty Comments:  No specialty comments available.  Imaging: No results found.   PMFS History: Patient Active Problem  List   Diagnosis Date Noted  . Polyclonal gammopathy determined by serum protein electrophoresis 01/30/2017   Past Medical History:  Diagnosis Date  . Hypertension   . Lupus (HCC)   . Thyroid disease     History reviewed. No pertinent family history.  Past Surgical History:  Procedure Laterality Date  . ABDOMINAL HYSTERECTOMY     Social History   Occupational History  . Not on file  Tobacco Use  . Smoking status: Current Every Day Smoker  . Smokeless tobacco: Never Used  Substance and Sexual Activity  . Alcohol use: No  . Drug use: No  . Sexual activity: Not on file

## 2018-06-22 ENCOUNTER — Other Ambulatory Visit: Payer: Self-pay

## 2018-06-22 DIAGNOSIS — M2550 Pain in unspecified joint: Secondary | ICD-10-CM | POA: Insufficient documentation

## 2018-06-22 DIAGNOSIS — M3500 Sicca syndrome, unspecified: Secondary | ICD-10-CM | POA: Insufficient documentation

## 2018-06-22 DIAGNOSIS — M0609 Rheumatoid arthritis without rheumatoid factor, multiple sites: Secondary | ICD-10-CM | POA: Insufficient documentation

## 2018-07-05 ENCOUNTER — Telehealth: Payer: Self-pay

## 2018-07-05 MED ORDER — CETIRIZINE HCL 10 MG PO TABS: 10.0000 mg | ORAL_TABLET | Freq: Every day | ORAL | 11 refills | Status: DC

## 2018-07-05 NOTE — Telephone Encounter (Signed)
Received fax from patient's pharmacy stating that her insurance doesn't cover Xyzal. It does cover & provide the following prescriptions at no cost: Zyrtec 10 mg(generic) #365, Claritin 10 mg(generic) #365 or Allegra 180mg (generic) #180

## 2018-07-05 NOTE — Telephone Encounter (Signed)
I will change to Zyrtec as the medication works similiarly to [employer].

## 2018-07-12 ENCOUNTER — Ambulatory Visit: Payer: 59 | Admitting: Family Medicine

## 2018-07-18 ENCOUNTER — Telehealth: Payer: Self-pay

## 2018-07-18 NOTE — Telephone Encounter (Signed)

## 2018-07-19 ENCOUNTER — Other Ambulatory Visit: Payer: Self-pay

## 2018-07-19 ENCOUNTER — Encounter: Payer: Self-pay | Admitting: Family Medicine

## 2018-07-19 ENCOUNTER — Ambulatory Visit (INDEPENDENT_AMBULATORY_CARE_PROVIDER_SITE_OTHER): Payer: 59 | Admitting: Family Medicine

## 2018-07-19 VITALS — BP 173/128 | HR 93 | Temp 98.3°F | Resp 17 | Ht 64.0 in | Wt 172.0 lb

## 2018-07-19 DIAGNOSIS — Z716 Tobacco abuse counseling: Secondary | ICD-10-CM

## 2018-07-19 DIAGNOSIS — F331 Major depressive disorder, recurrent, moderate: Secondary | ICD-10-CM | POA: Diagnosis not present

## 2018-07-19 DIAGNOSIS — I10 Essential (primary) hypertension: Secondary | ICD-10-CM

## 2018-07-19 DIAGNOSIS — M3500 Sicca syndrome, unspecified: Secondary | ICD-10-CM

## 2018-07-19 DIAGNOSIS — I701 Atherosclerosis of renal artery: Secondary | ICD-10-CM | POA: Diagnosis not present

## 2018-07-19 DIAGNOSIS — M0609 Rheumatoid arthritis without rheumatoid factor, multiple sites: Secondary | ICD-10-CM

## 2018-07-19 DIAGNOSIS — Z13220 Encounter for screening for lipoid disorders: Secondary | ICD-10-CM

## 2018-07-19 DIAGNOSIS — Z1329 Encounter for screening for other suspected endocrine disorder: Secondary | ICD-10-CM

## 2018-07-19 DIAGNOSIS — Z6829 Body mass index (BMI) 29.0-29.9, adult: Secondary | ICD-10-CM

## 2018-07-19 MED ORDER — ALBUTEROL SULFATE HFA 108 (90 BASE) MCG/ACT IN AERS: 2.0000 | INHALATION_SPRAY | RESPIRATORY_TRACT | 1 refills | Status: DC | PRN

## 2018-07-19 MED ORDER — LISINOPRIL-HYDROCHLOROTHIAZIDE 20-25 MG PO TABS: 1.0000 | ORAL_TABLET | Freq: Every day | ORAL | 3 refills | Status: DC

## 2018-07-19 MED ORDER — NIFEDIPINE ER OSMOTIC RELEASE 30 MG PO TB24: 30.0000 mg | ORAL_TABLET | Freq: Every day | ORAL | 2 refills | Status: DC

## 2018-07-19 NOTE — Progress Notes (Signed)
Patient ID: Denise Carter, female    DOB: 01-07-1971, 48 y.o.   MRN: [id number]  PCP: Denise Neighbors, FNP  Chief Complaint  Patient presents with  . Hypertension    Subjective:  HPI Denise Carter is a 48 y.o. female presents for evaluation hypertension.  Denise Carter has Polyclonal gammopathy determined by serum protein electrophoresis; Rheumatoid arthritis without rheumatoid factor, multiple sites (HCC); Polyarthralgia; and Sjogren's disease (HCC) on their problem list.   Patient reports confusion regarding her BP medication regimen. At present, she is only taking labetalol. She is prescribed nifedipine, lisinopril-HCTZ, and labetalol. Home BP have averaged in the 160's -180's />100. She has remained asymptomatic of shortness of breath, dizziness, chest pain, or weakness.  Social History   Socioeconomic History  . Marital status: Married    Spouse name: Not on file  . Number of children: Not on file  . Years of education: Not on file  . Highest education level: Not on file  Occupational History  . Not on file  Social Needs  . Financial resource strain: Not on file  . Food insecurity    Worry: Not on file    Inability: Not on file  . Transportation needs    Medical: Not on file    Non-medical: Not on file  Tobacco Use  . Smoking status: Current Every Day Smoker  . Smokeless tobacco: Never Used  Substance and Sexual Activity  . Alcohol use: No  . Drug use: No  . Sexual activity: Not on file  Lifestyle  . Physical activity    Days per week: Not on file    Minutes per session: Not on file  . Stress: Not on file  Relationships  . Social Musician on phone: Not on file    Gets together: Not on file    Attends religious service: Not on file    Active member of club or organization: Not on file    Attends meetings of clubs or organizations: Not on file    Relationship status: Not on file  . Intimate partner violence    Fear of current or  ex partner: Not on file    Emotionally abused: Not on file    Physically abused: Not on file    Forced sexual activity: Not on file  Other Topics Concern  . Not on file  Social History Narrative  . Not on file    No family history on file.   Review of Systems  Pertinent negatives listed in HPI  No Known Allergies  Prior to Admission medications   Medication Sig Start Date End Date Taking? Authorizing Provider  cetirizine (ZYRTEC) 10 MG tablet Take 1 tablet (10 mg total) by mouth daily. 07/05/18  Yes Denise Neighbors, FNP  etanercept (ENBREL) 50 MG/ML injection Inject 50 mg into the skin once a week.   Yes [provider]  folic acid (FOLVITE) 1 MG tablet Take 1 mg by mouth daily.   Yes [provider]  hydroxychloroquine (PLAQUENIL) 200 MG tablet Take 1 tablet by mouth 2 (two) times a day. 06/07/18  Yes [provider]  ipratropium (ATROVENT) 0.03 % nasal spray Place 2 sprays into both nostrils 2 (two) times daily. 06/07/18  Yes Denise Neighbors, FNP  labetalol (NORMODYNE) 300 MG tablet Take 1 tablet (300 mg total) by mouth 3 (three) times daily. 03/08/18  Yes Denise Neighbors, FNP  pregabalin (LYRICA) 100 MG capsule Take 1 capsule (  100 mg total) by mouth 2 (two) times daily. 03/08/18  Yes Denise Neighbors, FNP  NIFEdipine (PROCARDIA XL) 30 MG 24 hr tablet Take 1 tablet (30 mg total) by mouth daily. Patient not taking: Reported on 07/19/2018 11/23/17   Denise Neighbors, FNP    Past Medical, Surgical Family and Social History reviewed and updated.    Objective:   Today's Vitals   07/19/18 0838  BP: (!) 173/128  Pulse: 93  Resp: 17  Temp: 98.3 F (36.8 C)  TempSrc: Temporal  SpO2: 98%  Weight: 172 lb (78 kg)  Height: 5\' 4"  (1.626 m)    BP Readings from Last 3 Encounters:  07/19/18 (!) 173/128  06/20/18 (!) 192/122  05/04/18 (!) 205/127    Filed Weights   07/19/18 0838  Weight: 172 lb (78 kg)      Physical Exam General  appearance: alert, well developed, well nourished, cooperative and in no distress Head: Normocephalic, without obvious abnormality, atraumatic Respiratory: Respirations even and unlabored, normal respiratory rate Heart: rate and rhythm normal. No gallop or murmurs noted on exam  Abdomen: BS +, no distention, no rebound tenderness, or no mass Extremities: No gross deformities Skin: Skin color, texture, turgor normal. No rashes seen  Psych: Appropriate mood and affect. Neurologic: Mental status: Alert, oriented to person, place, and time, thought content appropriate.  No results found for: POCGLU  Lab Results  Component Value Date   HGBA1C 4.8 11/02/2017      Assessment & Plan:  1. Encounter for smoking cessation counseling -Not ready today. Trying to cut-back. Understands smoking is likely worsening hypertension.  2. Essential hypertension, uncontrolled -We reviewed medications for management of hypertension. -Refilled all medications requested. -Patient will continue to monitor and record BP readings at home.   3. Moderate episode of recurrent major depressive disorder (HCC), symptom controlled since patient has started treatment with Rheumatology for management of Lupus and Rheumatoid Arthritis.  Depression screen [facility] 2/9 07/19/2018 11/01/2017  Decreased Interest 1 2  Down, Depressed, Hopeless 1 1  PHQ - 2 Score 2 3  Altered sleeping 1 2  Tired, decreased energy 1 3  Change in appetite 0 2  Feeling bad or failure about yourself  0 1  Trouble concentrating 1 1  Moving slowly or fidgety/restless 0 3  Suicidal thoughts 0 2  PHQ-9 Score 5 17     4. Rheumatoid arthritis without rheumatoid factor, multiple sites (HCC) 5. Sjogren's syndrome, with unspecified organ involvement (HCC) - Followed by [facility]   Meds ordered this encounter  Medications  . lisinopril-hydrochlorothiazide (ZESTORETIC) 20-25 MG tablet    Sig: Take 1 tablet by mouth daily.     Dispense:  90 tablet    Refill:  3  . NIFEdipine (PROCARDIA XL) 30 MG 24 hr tablet    Sig: Take 1 tablet (30 mg total) by mouth daily.    Dispense:  30 tablet    Refill:  2  . albuterol (VENTOLIN HFA) 108 (90 Base) MCG/ACT inhaler    Sig: Inhale 2 puffs into the lungs every 4 (four) hours as needed for wheezing or shortness of breath (cough, shortness of breath or wheezing.).    Dispense:  8 g    Refill:  1  . atorvastatin (LIPITOR) 20 MG tablet    Sig: Take 1 tablet (20 mg total) by mouth daily.    Dispense:  90 tablet    Refill:  3    Orders Placed This Encounter  Procedures  . Lipid panel    Order Specific Question:   Has the patient fasted?    Answer:   No  . Hemoglobin A1c  . Comprehensive metabolic panel    Order Specific Question:   Has the patient fasted?    Answer:   No  . TSH    RTC: 3 month hypertension follow-up. Check A1C longtime prednisone use for RA treatment.    Molli Barrows, FNP Primary Care at [facility] [address], [place] [phone]: [phone]

## 2018-07-19 NOTE — Patient Instructions (Signed)
Shortness of Breath, Adult Shortness of breath means you have trouble breathing. Shortness of breath could be a sign of a medical problem. Follow these instructions at home:   Watch for any changes in your symptoms.  Do not use any products that contain nicotine or tobacco, such as cigarettes, e-cigarettes, and chewing tobacco.  Do not smoke. Smoking can cause shortness of breath. If you need help to quit smoking, ask your doctor.  Avoid things that can make it harder to breathe, such as: ? Mold. ? Dust. ? Air pollution. ? Chemical smells. ? Things that can cause allergy symptoms (allergens), if you have allergies.  Keep your living space clean. Use products that help remove mold and dust.  Rest as needed. Slowly return to your normal activities.  Take over-the-counter and prescription medicines only as told by your doctor. This includes oxygen therapy and inhaled medicines.  Keep all follow-up visits as told by your doctor. This is important. Contact a doctor if:  Your condition does not get better as soon as expected.  You have a hard time doing your normal activities, even after you rest.  You have new symptoms. Get help right away if:  Your shortness of breath gets worse.  You have trouble breathing when you are resting.  You feel light-headed or you pass out (faint).  You have a cough that is not helped by medicines.  You cough up blood.  You have pain with breathing.  You have pain in your chest, arms, shoulders, or belly (abdomen).  You have a fever.  You cannot walk up stairs.  You cannot exercise the way you normally do. These symptoms may represent a serious problem that is an emergency. Do not wait to see if the symptoms will go away. Get medical help right away. Call your local emergency services (911 in the [place] Do not drive yourself to the hospital. Summary  Shortness of breath is when you have trouble breathing enough air. It can be a sign  of a medical problem.  Avoid things that make it hard for you to breathe, such as smoking, pollution, mold, and dust.  Watch for any changes in your symptoms. Contact your doctor if you do not get better or you get worse. This information is not intended to replace advice given to you by your health care provider. Make sure you discuss any questions you have with your health care provider. Document Released: 06/30/2007 Document Revised: 06/13/2017 Document Reviewed: 06/13/2017 Elsevier Interactive Patient Education  2019 [employer].   Hypertension Hypertension is another name for high blood pressure. High blood pressure forces your heart to work harder to pump blood. This can cause problems over time. There are two numbers in a blood pressure reading. There is a top number (systolic) over a bottom number (diastolic). It is best to have a blood pressure below 120/80. Healthy choices can help lower your blood pressure. You may need medicine to help lower your blood pressure if:  Your blood pressure cannot be lowered with healthy choices.  Your blood pressure is higher than 130/80. Follow these instructions at home: Eating and drinking   If directed, follow the DASH eating plan. This diet includes: ? Filling half of your plate at each meal with fruits and vegetables. ? Filling one quarter of your plate at each meal with whole grains. Whole grains include whole wheat pasta, brown rice, and whole grain bread. ? Eating or drinking low-fat dairy products, such as skim milk  or low-fat yogurt. ? Filling one quarter of your plate at each meal with low-fat (lean) proteins. Low-fat proteins include fish, skinless chicken, eggs, beans, and tofu. ? Avoiding fatty meat, cured and processed meat, or chicken with skin. ? Avoiding premade or processed food.  Eat less than 1,500 mg of salt (sodium) a day.  Limit alcohol use to no more than 1 drink a day for nonpregnant women and 2 drinks a day for  men. One drink equals 12 oz of beer, 5 oz of wine, or 1 oz of hard liquor. Lifestyle  Work with your doctor to stay at a healthy weight or to lose weight. Ask your doctor what the best weight is for you.  Get at least 30 minutes of exercise that causes your heart to beat faster (aerobic exercise) most days of the week. This may include walking, swimming, or biking.  Get at least 30 minutes of exercise that strengthens your muscles (resistance exercise) at least 3 days a week. This may include lifting weights or pilates.  Do not use any products that contain nicotine or tobacco. This includes cigarettes and e-cigarettes. If you need help quitting, ask your doctor.  Check your blood pressure at home as told by your doctor.  Keep all follow-up visits as told by your doctor. This is important. Medicines  Take over-the-counter and prescription medicines only as told by your doctor. Follow directions carefully.  Do not skip doses of blood pressure medicine. The medicine does not work as well if you skip doses. Skipping doses also puts you at risk for problems.  Ask your doctor about side effects or reactions to medicines that you should watch for. Contact a doctor if:  You think you are having a reaction to the medicine you are taking.  You have headaches that keep coming back (recurring).  You feel dizzy.  You have swelling in your ankles.  You have trouble with your vision. Get help right away if:  You get a very bad headache.  You start to feel confused.  You feel weak or numb.  You feel faint.  You get very bad pain in your: ? Chest. ? Belly (abdomen).  You throw up (vomit) more than once.  You have trouble breathing. Summary  Hypertension is another name for high blood pressure.  Making healthy choices can help lower blood pressure. If your blood pressure cannot be controlled with healthy choices, you may need to take medicine. This information is not intended to  replace advice given to you by your health care provider. Make sure you discuss any questions you have with your health care provider. Document Released: 06/30/2007 Document Revised: 12/10/2015 Document Reviewed: 12/10/2015 Elsevier Interactive Patient Education  2019 [employer].

## 2018-07-20 LAB — HEMOGLOBIN A1C
Est. average glucose Bld gHb Est-mCnc: 97 mg/dL
Hgb A1c MFr Bld: 5 % (ref 4.8–5.6)

## 2018-07-20 LAB — LIPID PANEL
Chol/HDL Ratio: 3.2 ratio (ref 0.0–4.4)
Cholesterol, Total: 203 mg/dL — ABNORMAL HIGH (ref 100–199)
HDL: 64 mg/dL (ref >39)
LDL Calculated: 123 mg/dL — ABNORMAL HIGH (ref 0–99)
Triglycerides: 82 mg/dL (ref 0–149)
VLDL Cholesterol Cal: 16 mg/dL (ref 5–40)

## 2018-07-20 LAB — COMPREHENSIVE METABOLIC PANEL
ALT: 6 IU/L (ref 0–32)
AST: 13 IU/L (ref 0–40)
Albumin/Globulin Ratio: 1.2 (ref 1.2–2.2)
Albumin: 4.1 g/dL (ref 3.8–4.8)
Alkaline Phosphatase: 60 IU/L (ref 39–117)
BUN/Creatinine Ratio: 14 (ref 9–23)
BUN: 11 mg/dL (ref 6–24)
Bilirubin Total: 0.3 mg/dL (ref 0.0–1.2)
CO2: 23 mmol/L (ref 20–29)
Calcium: 9 mg/dL (ref 8.7–10.2)
Chloride: 102 mmol/L (ref 96–106)
Creatinine, Ser: 0.79 mg/dL (ref 0.57–1.00)
GFR calc Af Amer: 102 mL/min/{1.73_m2} (ref >59)
GFR calc non Af Amer: 88 mL/min/{1.73_m2} (ref >59)
Globulin, Total: 3.4 g/dL (ref 1.5–4.5)
Glucose: 109 mg/dL — ABNORMAL HIGH (ref 65–99)
Potassium: 3.6 mmol/L (ref 3.5–5.2)
Sodium: 142 mmol/L (ref 134–144)
Total Protein: 7.5 g/dL (ref 6.0–8.5)

## 2018-07-20 LAB — TSH: TSH: 1.39 u[IU]/mL (ref 0.450–4.500)

## 2018-07-20 MED ORDER — ATORVASTATIN CALCIUM 20 MG PO TABS: 20.0000 mg | ORAL_TABLET | Freq: Every day | ORAL | 3 refills | Status: DC

## 2018-08-01 ENCOUNTER — Encounter: Payer: Self-pay | Admitting: Family Medicine

## 2018-08-03 ENCOUNTER — Encounter: Payer: Self-pay | Admitting: Family Medicine

## 2018-08-04 ENCOUNTER — Other Ambulatory Visit: Payer: Self-pay | Admitting: Family Medicine

## 2018-08-04 DIAGNOSIS — M25531 Pain in right wrist: Secondary | ICD-10-CM

## 2018-08-04 DIAGNOSIS — M79641 Pain in right hand: Secondary | ICD-10-CM

## 2018-08-04 NOTE — Progress Notes (Signed)
Referral placed to hand specialist,[facility], Denise Carter.

## 2018-08-09 ENCOUNTER — Encounter: Payer: Self-pay | Admitting: Family Medicine

## 2018-08-14 ENCOUNTER — Encounter: Payer: Self-pay | Admitting: Orthopaedic Surgery

## 2018-08-15 ENCOUNTER — Telehealth: Payer: Self-pay | Admitting: Orthopaedic Surgery

## 2018-08-15 NOTE — Telephone Encounter (Signed)
Patient left a voicemail stating she was returning your call to set up an appointment with the hand specialist.

## 2018-08-16 ENCOUNTER — Other Ambulatory Visit: Payer: Self-pay | Admitting: *Deleted

## 2018-08-16 DIAGNOSIS — M79641 Pain in right hand: Secondary | ICD-10-CM

## 2018-08-16 NOTE — Telephone Encounter (Signed)
[facility] has received referral, under review, pending appt, I called patient.

## 2018-10-10 ENCOUNTER — Encounter: Payer: Self-pay | Admitting: Family Medicine

## 2018-10-24 ENCOUNTER — Telehealth: Payer: Self-pay

## 2018-10-24 NOTE — Telephone Encounter (Signed)

## 2018-10-25 ENCOUNTER — Ambulatory Visit (INDEPENDENT_AMBULATORY_CARE_PROVIDER_SITE_OTHER): Payer: Managed Care, Other (non HMO) | Admitting: Nurse Practitioner

## 2018-10-25 ENCOUNTER — Other Ambulatory Visit: Payer: Self-pay

## 2018-10-25 VITALS — BP 165/125 | HR 95 | Temp 97.2°F | Resp 17 | Wt 174.0 lb

## 2018-10-25 DIAGNOSIS — F17210 Nicotine dependence, cigarettes, uncomplicated: Secondary | ICD-10-CM | POA: Diagnosis not present

## 2018-10-25 DIAGNOSIS — Z23 Encounter for immunization: Secondary | ICD-10-CM | POA: Diagnosis not present

## 2018-10-25 DIAGNOSIS — F17200 Nicotine dependence, unspecified, uncomplicated: Secondary | ICD-10-CM

## 2018-10-25 DIAGNOSIS — G6289 Other specified polyneuropathies: Secondary | ICD-10-CM

## 2018-10-25 DIAGNOSIS — I10 Essential (primary) hypertension: Secondary | ICD-10-CM | POA: Diagnosis not present

## 2018-10-25 DIAGNOSIS — E785 Hyperlipidemia, unspecified: Secondary | ICD-10-CM | POA: Diagnosis not present

## 2018-10-25 MED ORDER — VARENICLINE TARTRATE 0.5 MG PO TABS: ORAL_TABLET | ORAL | 0 refills | Status: DC

## 2018-10-25 MED ORDER — PREGABALIN 150 MG PO CAPS: 150.0000 mg | ORAL_CAPSULE | Freq: Two times a day (BID) | ORAL | 1 refills | Status: DC

## 2018-10-25 MED ORDER — VARENICLINE TARTRATE 1 MG PO TABS: 1.0000 mg | ORAL_TABLET | Freq: Two times a day (BID) | ORAL | 0 refills | Status: AC

## 2018-10-25 MED ORDER — AMLODIPINE BESYLATE 10 MG PO TABS: 10.0000 mg | ORAL_TABLET | Freq: Every day | ORAL | 3 refills | Status: DC

## 2018-10-25 NOTE — Progress Notes (Signed)
Patient stopped Labetalol because it was causing her to have headaches.  Would like flu shot today.  Wants to know if she needs a pneumonia vaccine.

## 2018-10-25 NOTE — Progress Notes (Signed)
Denise Carter was seen today for hypertension.  Diagnoses and all orders for this visit:  Essential hypertension -     amLODipine (NORVASC) 10 MG tablet; Take 1 tablet (10 mg total) by mouth daily. Continue all antihypertensives as prescribed.  Remember to bring in your blood pressure log with you for your follow up appointment.  DASH/Mediterranean Diets are healthier choices for HTN.    Dyslipidemia -     Lipid panel INSTRUCTIONS: Work on a low fat, heart healthy diet and participate in regular aerobic exercise program by working out at least 150 minutes per week; 5 days a week-30 minutes per day. Avoid red meat, fried foods. junk foods, sodas, sugary drinks, unhealthy snacking, alcohol and smoking.  Drink at least 48oz of water per day and monitor your carbohydrate intake daily.    Other polyneuropathy -     pregabalin (LYRICA) 150 MG capsule; Take 1 capsule (150 mg total) by mouth 2 (two) times daily.  Influenza vaccine administered -     Flu Vaccine QUAD 6+ mos PF IM (Fluarix Quad PF)  Tobacco dependence -     varenicline (CHANTIX) 0.5 MG tablet; Days 1 to 3: Take 1 (one) tablet by mouth once daily. Days 4 to 7: Take 1(one) tablet by mouth twice daily. Then start on 0.1 mg tablets. -     varenicline (CHANTIX CONTINUING MONTH PAK) 1 MG tablet; Take 1 tablet (1 mg total) by mouth 2 (two) times daily. 1. Johnanna continues to smoke. 2. Deamber was counseled on the dangers of tobacco use, and was advised to quit. We reviewed specific strategies to maximize success, including removing cigarettes and smoking materials from environment, stress management and support of family/friends as well as pharmacological alternatives. 3. A total of 4 minutes was spent on counseling for smoking cessation and Gioia is ready to quit and has chosen CHANTIX to start today.  4. Ailah was offered Wellbutrin, Chantix, Nicotine patch, Nicotine gum or lozenges.  Due to out of pocket costs Zan was also  given smoking cessation support and advised to contact: the Smoking Cessation hotline: 1-800-QUIT-NOW.  Breda was also informed of our Smoking cessation classes which are also available through [facility] by calling [phone] or visit our website at [email or web address].  5. Will follow up at next scheduled office visit.     Patient has been counseled on age-appropriate routine health concerns for screening and prevention. These are reviewed and up-to-date. Referrals have been placed accordingly. Immunizations are up-to-date or declined.    Subjective:   Chief Complaint  Patient presents with  . Hypertension   HPI Denise Carter 48 y.o. female presents to office today for HTN.  has a past medical history of Hypertension, Lupus (HCC), and Thyroid disease.  Essential Hypertension She reports a lot of stress with several family members and their families all living under one room in her home. She stopped taking labetalol 300 mg 3 times daily due to frequent headaches.  She is currently taking lisinopril-hydrochlorothiazide 20-25 mg daily and nifedipine XL 30 mg daily.  We will start her on amlodipine 10 mg today.  She has taken amlodipine but cannot recall why she was taken off and does not remember any undesirable side effects with taking it.  There is a note in the chart from Dr. Eden Emms with cardiology on 01/06/2018 that states patient reported headache with Norvasc however will retry again today.  She also has been on verapamil in the past.  As hypotension appeared to be her only issue at that time Dr. Johnsie Cancel deferred patient back to PCP for blood pressure management.  States blood pressures are high at home with no specific readings recalled. Denies chest pain, shortness of breath, palpitations, lightheadedness, dizziness, headaches or BLE edema.  She is nonadherent in regard to dietary and exercise management. BP Readings from Last 3 Encounters:  10/25/18 (!)  165/125  07/19/18 (!) 173/128  06/20/18 (!) 192/122   Dyslipidemia She was started on atorvastatin 20mg  in February.  She endorses medication compliance and denies any statin intolerance.  Will recheck fasting lipid panel today. Lab Results  Component Value Date   CHOL 174 10/25/2018   CHOL 203 (H) 07/19/2018   CHOL 165 11/02/2017   Lab Results  Component Value Date   HDL 57 10/25/2018   HDL 64 07/19/2018   HDL 51 11/02/2017   Lab Results  Component Value Date   LDLCALC 103 (H) 10/25/2018   LDLCALC 123 (H) 07/19/2018   LDLCALC 98 11/02/2017   Lab Results  Component Value Date   TRIG 73 10/25/2018   TRIG 82 07/19/2018   TRIG 80 11/02/2017   Lab Results  Component Value Date   CHOLHDL 3.1 10/25/2018   CHOLHDL 3.2 07/19/2018   CHOLHDL 3.2 11/02/2017    Polyneuropathy States lyrica 100 mg BID does very little for her relief of pain. Will increase to 150 mg BID. I have instructed her that she may need to be referred to pain management if lyrica 150 mg BID is not effective.   Review of Systems  Constitutional: Negative for fever, malaise/fatigue and weight loss.  HENT: Negative.  Negative for nosebleeds.   Eyes: Negative.  Negative for blurred vision, double vision and photophobia.  Respiratory: Negative.  Negative for cough and shortness of breath.   Cardiovascular: Negative.  Negative for chest pain, palpitations and leg swelling.  Gastrointestinal: Negative.  Negative for heartburn, nausea and vomiting.  Musculoskeletal: Positive for joint pain. Negative for myalgias.  Neurological: Positive for sensory change. Negative for dizziness, focal weakness, seizures and headaches.  Psychiatric/Behavioral: Negative.  Negative for suicidal ideas.    Past Medical History:  Diagnosis Date  . Hypertension   . Lupus ([place])   . Thyroid disease     Past Surgical History:  Procedure Laterality Date  . ABDOMINAL HYSTERECTOMY      No family history on file.  Social History  Reviewed with no changes to be made today.   Outpatient Medications Prior to Visit  Medication Sig Dispense Refill  . albuterol (VENTOLIN HFA) 108 (90 Base) MCG/ACT inhaler Inhale 2 puffs into the lungs every 4 (four) hours as needed for wheezing or shortness of breath (cough, shortness of breath or wheezing.). 8 g 1  . atorvastatin (LIPITOR) 20 MG tablet Take 1 tablet (20 mg total) by mouth daily. 90 tablet 3  . cetirizine (ZYRTEC) 10 MG tablet Take 1 tablet (10 mg total) by mouth daily. 30 tablet 11  . hydroxychloroquine (PLAQUENIL) 200 MG tablet Take 1 tablet by mouth 2 (two) times a day.    . lisinopril-hydrochlorothiazide (ZESTORETIC) 20-25 MG tablet Take 1 tablet by mouth daily. 90 tablet 3  . NIFEdipine (PROCARDIA XL) 30 MG 24 hr tablet Take 1 tablet (30 mg total) by mouth daily. 30 tablet 2  . pregabalin (LYRICA) 100 MG capsule Take 1 capsule (100 mg total) by mouth 2 (two) times daily. 60 capsule 3  . ipratropium (ATROVENT) 0.03 % nasal spray Place  2 sprays into both nostrils 2 (two) times daily. (Patient not taking: Reported on 10/25/2018) 30 mL 0  . etanercept (ENBREL) 50 MG/ML injection Inject 50 mg into the skin once a week.    . folic acid (FOLVITE) 1 MG tablet Take 1 mg by mouth daily.    Marland Kitchen labetalol (NORMODYNE) 300 MG tablet Take 1 tablet (300 mg total) by mouth 3 (three) times daily. 180 tablet 3   No facility-administered medications prior to visit.     Allergies  Allergen Reactions  . Labetalol     Headaches       Objective:    BP (!) 165/125   Pulse 95   Temp (!) 97.2 F (36.2 C) (Temporal)   Resp 17   Wt 174 lb (78.9 kg)   SpO2 98%   BMI 29.87 kg/m  Wt Readings from Last 3 Encounters:  10/25/18 174 lb (78.9 kg)  07/19/18 172 lb (78 kg)  06/20/18 168 lb (76.2 kg)    Physical Exam Vitals signs and nursing note reviewed.  Constitutional:      Appearance: She is well-developed.  HENT:     Head: Normocephalic and atraumatic.  Neck:     Musculoskeletal:  Normal range of motion.  Cardiovascular:     Rate and Rhythm: Normal rate and regular rhythm.     Heart sounds: Normal heart sounds. No murmur. No friction rub. No gallop.   Pulmonary:     Effort: Pulmonary effort is normal. No tachypnea or respiratory distress.     Breath sounds: Normal breath sounds. No decreased breath sounds, wheezing, rhonchi or rales.  Chest:     Chest wall: No tenderness.  Abdominal:     General: Bowel sounds are normal.     Palpations: Abdomen is soft.  Musculoskeletal: Normal range of motion.  Skin:    General: Skin is warm and dry.  Neurological:     Mental Status: She is alert and oriented to person, place, and time.     Coordination: Coordination normal.  Psychiatric:        Behavior: Behavior normal. Behavior is cooperative.        Thought Content: Thought content normal.        Judgment: Judgment normal.          Patient has been counseled extensively about nutrition and exercise as well as the importance of adherence with medications and regular follow-up. The patient was given clear instructions to go to ER or return to medical center if symptoms don't improve, worsen or new problems develop. The patient verbalized understanding.   Follow-up: Return in about 2 weeks (around 11/08/2018) for BP recheck.   Claiborne Rigg, FNP-BC [facility] [place], [place] [phone]   10/28/2018, 9:20 PM

## 2018-10-26 LAB — LIPID PANEL
Chol/HDL Ratio: 3.1 ratio (ref 0.0–4.4)
Cholesterol, Total: 174 mg/dL (ref 100–199)
HDL: 57 mg/dL (ref >39)
LDL Chol Calc (NIH): 103 mg/dL — ABNORMAL HIGH (ref 0–99)
Triglycerides: 73 mg/dL (ref 0–149)
VLDL Cholesterol Cal: 14 mg/dL (ref 5–40)

## 2018-10-28 ENCOUNTER — Encounter: Payer: Self-pay | Admitting: Nurse Practitioner

## 2018-11-02 ENCOUNTER — Encounter: Payer: Self-pay | Admitting: Family Medicine

## 2018-11-08 ENCOUNTER — Ambulatory Visit: Payer: Managed Care, Other (non HMO)

## 2018-11-18 ENCOUNTER — Encounter: Payer: Self-pay | Admitting: Nurse Practitioner

## 2018-11-27 ENCOUNTER — Encounter: Payer: Self-pay | Admitting: Family Medicine

## 2018-11-27 ENCOUNTER — Other Ambulatory Visit: Payer: Self-pay | Admitting: Nurse Practitioner

## 2018-11-27 DIAGNOSIS — G629 Polyneuropathy, unspecified: Secondary | ICD-10-CM

## 2018-12-04 ENCOUNTER — Ambulatory Visit: Payer: Managed Care, Other (non HMO)

## 2018-12-07 NOTE — Progress Notes (Deleted)
[facility] Clinic Note - Initial Visit   Date: 12/07/18  Denise Carter MRN: [id number] DOB: 09/27/1970   Dear Molli Barrows, FNP:  Thank you for your kind referral of Lina Sar for consultation of ***. Although her history is well known to you, please allow [place] to reiterate it for the purpose of our medical record. The patient was accompanied to the clinic by *** who also provides collateral information.     History of Present Illness: Denise Carter is a 48 y.o. ***-handed female with hypertension, hypothyroidism, lupus, *** presenting for evaluation of ***.    Out-side paper records, electronic medical record, and images have been reviewed where available and summarized as: *** Lab Results  Component Value Date   HGBA1C 5.0 07/19/2018   No results found for: [id number] Lab Results  Component Value Date   TSH 1.390 07/19/2018   No results found for: ESRSEDRATE, POCTSEDRATE  Past Medical History:  Diagnosis Date  . Hypertension   . Lupus ([place])   . Thyroid disease     Past Surgical History:  Procedure Laterality Date  . ABDOMINAL HYSTERECTOMY       Medications:  Outpatient Encounter Medications as of 12/08/2018  Medication Sig  . albuterol (VENTOLIN HFA) 108 (90 Base) MCG/ACT inhaler Inhale 2 puffs into the lungs every 4 (four) hours as needed for wheezing or shortness of breath (cough, shortness of breath or wheezing.).  Marland Kitchen amLODipine (NORVASC) 10 MG tablet Take 1 tablet (10 mg total) by mouth daily.  Marland Kitchen atorvastatin (LIPITOR) 20 MG tablet Take 1 tablet (20 mg total) by mouth daily.  . cetirizine (ZYRTEC) 10 MG tablet Take 1 tablet (10 mg total) by mouth daily.  . hydrochlorothiazide (HYDRODIURIL) 25 MG tablet   . hydroxychloroquine (PLAQUENIL) 200 MG tablet Take 1 tablet by mouth 2 (two) times a day.  . ipratropium (ATROVENT) 0.03 % nasal spray Place 2 sprays into both nostrils 2 (two) times daily. (Patient not  taking: Reported on 10/25/2018)  . lisinopril (ZESTRIL) 20 MG tablet   . lisinopril-hydrochlorothiazide (ZESTORETIC) 20-25 MG tablet Take 1 tablet by mouth daily.  . pregabalin (LYRICA) 150 MG capsule Take 1 capsule (150 mg total) by mouth 2 (two) times daily.  . varenicline (CHANTIX CONTINUING MONTH PAK) 1 MG tablet Take 1 tablet (1 mg total) by mouth 2 (two) times daily.  . varenicline (CHANTIX) 0.5 MG tablet Days 1 to 3: Take 1 (one) tablet by mouth once daily. Days 4 to 7: Take 1(one) tablet by mouth twice daily. Then start on 0.1 mg tablets.   No facility-administered encounter medications on file as of 12/08/2018.     Allergies:  Allergies  Allergen Reactions  . Labetalol     Headaches    Family History: No family history on file.  Social History: Social History   Tobacco Use  . Smoking status: Current Every Day Smoker  . Smokeless tobacco: Never Used  Substance Use Topics  . Alcohol use: No  . Drug use: No   Social History   Social History Narrative  . Not on file    Review of Systems:  CONSTITUTIONAL: No fevers, chills, night sweats, or weight loss.  *** EYES: No visual changes or eye pain ENT: No hearing changes.  No history of nose bleeds.   RESPIRATORY: No cough, wheezing and shortness of breath.   CARDIOVASCULAR: Negative for chest pain, and palpitations.   GI: Negative for abdominal discomfort, blood in stools or black stools.  No recent change in bowel habits.   GU:  No history of incontinence.   MUSCLOSKELETAL: No history of joint pain or swelling.  No myalgias.   SKIN: Negative for lesions, rash, and itching.   HEMATOLOGY/ONCOLOGY: Negative for prolonged bleeding, bruising easily, and swollen nodes.  No history of cancer.   ENDOCRINE: Negative for cold or heat intolerance, polydipsia or goiter.   PSYCH:  ***depression or anxiety symptoms.   NEURO: As Above.   Vital Signs:  There were no vitals taken for this visit.   General Medical Exam:  ***  General:  Well appearing, comfortable.   Eyes/ENT: see cranial nerve examination.   Neck:   No carotid bruits. Respiratory:  Clear to auscultation, good air entry bilaterally.   Cardiac:  Regular rate and rhythm, no murmur.   Extremities:  No deformities, edema, or skin discoloration.  Skin:  No rashes or lesions.  Neurological Exam: MENTAL STATUS including orientation to time, place, person, recent and remote memory, attention span and concentration, language, and fund of knowledge is ***normal.  Speech is not dysarthric.  CRANIAL NERVES: II:  No visual field defects.  Unremarkable fundi.   III-IV-VI: Pupils equal round and reactive to light.  Normal conjugate, extra-ocular eye movements in all directions of gaze.  No nystagmus.  No ptosis***.   V:  Normal facial sensation.    VII:  Normal facial symmetry and movements.   VIII:  Normal hearing and vestibular function.   IX-X:  Normal palatal movement.   XI:  Normal shoulder shrug and head rotation.   XII:  Normal tongue strength and range of motion, no deviation or fasciculation.  MOTOR:  No atrophy, fasciculations or abnormal movements.  No pronator drift.   Upper Extremity:  Right  Left  Deltoid  5/5   5/5   Biceps  5/5   5/5   Triceps  5/5   5/5   Infraspinatus 5/5  5/5  Medial pectoralis 5/5  5/5  Wrist extensors  5/5   5/5   Wrist flexors  5/5   5/5   Finger extensors  5/5   5/5   Finger flexors  5/5   5/5   Dorsal interossei  5/5   5/5   Abductor pollicis  5/5   5/5   Tone (Ashworth scale)  0  0   Lower Extremity:  Right  Left  Hip flexors  5/5   5/5   Hip extensors  5/5   5/5   Adductor 5/5  5/5  Abductor 5/5  5/5  Knee flexors  5/5   5/5   Knee extensors  5/5   5/5   Dorsiflexors  5/5   5/5   Plantarflexors  5/5   5/5   Toe extensors  5/5   5/5   Toe flexors  5/5   5/5   Tone (Ashworth scale)  0  0   MSRs:  Right        Left                  brachioradialis 2+  2+  biceps 2+  2+  triceps 2+  2+   patellar 2+  2+  ankle jerk 2+  2+  Hoffman no  no  plantar response down  down   SENSORY:  Normal and symmetric perception of light touch, pinprick, vibration, and proprioception.  Romberg's sign absent.   COORDINATION/GAIT: Normal finger-to- nose-finger and heel-to-shin.  Intact rapid alternating movements bilaterally.  Able to  rise from a chair without using arms.  Gait narrow based and stable. Tandem and stressed gait intact.    IMPRESSION: ***  PLAN/RECOMMENDATIONS:  *** Return to clinic in *** months.    Thank you for allowing me to participate in patient's care.  If I can answer any additional questions, I would be pleased to do so.    Sincerely,    Bobak Oguinn K. Allena KatzPatel, DO

## 2018-12-08 ENCOUNTER — Ambulatory Visit: Payer: Managed Care, Other (non HMO) | Admitting: Neurology

## 2018-12-20 ENCOUNTER — Ambulatory Visit: Payer: Managed Care, Other (non HMO) | Admitting: Family Medicine

## 2019-01-06 ENCOUNTER — Other Ambulatory Visit (INDEPENDENT_AMBULATORY_CARE_PROVIDER_SITE_OTHER): Payer: Self-pay | Admitting: Orthopedic Surgery

## 2019-01-08 NOTE — Telephone Encounter (Signed)
No refill-either return to office for follow up or check with primary care physician

## 2019-01-08 NOTE — Telephone Encounter (Signed)
Tried to call patient. Number has been disconnected.

## 2019-01-08 NOTE — Telephone Encounter (Signed)
Please advise 

## 2019-01-08 NOTE — Telephone Encounter (Signed)
thanks

## 2019-01-23 ENCOUNTER — Encounter: Payer: Self-pay | Admitting: Emergency Medicine

## 2019-01-23 ENCOUNTER — Other Ambulatory Visit: Payer: Self-pay

## 2019-01-23 ENCOUNTER — Ambulatory Visit
Admission: EM | Admit: 2019-01-23 | Discharge: 2019-01-23 | Disposition: A | Discharge status: Home / Self Care | Payer: Managed Care, Other (non HMO) | Attending: Emergency Medicine | Admitting: Emergency Medicine

## 2019-01-23 DIAGNOSIS — Z20828 Contact with and (suspected) exposure to other viral communicable diseases: Secondary | ICD-10-CM

## 2019-01-23 DIAGNOSIS — R0981 Nasal congestion: Secondary | ICD-10-CM

## 2019-01-23 DIAGNOSIS — R519 Headache, unspecified: Secondary | ICD-10-CM | POA: Diagnosis not present

## 2019-01-23 DIAGNOSIS — I160 Hypertensive urgency: Secondary | ICD-10-CM | POA: Diagnosis not present

## 2019-01-23 MED ORDER — AZITHROMYCIN 250 MG PO TABS: 250.0000 mg | ORAL_TABLET | Freq: Every day | ORAL | 0 refills | Status: DC

## 2019-01-23 NOTE — ED Notes (Signed)
Patient able to ambulate independently  

## 2019-01-23 NOTE — ED Triage Notes (Signed)
Pt presents to [facility] for headache starting yesterday at work.  Patient states over the weekend she began to have nasal congestion at night.  Patient states headache has continued into today without relief.

## 2019-01-23 NOTE — Discharge Instructions (Addendum)
Take antibiotic as prescribed. Use Flonase once daily in each nostril and take allergy medication. Go home and take your blood pressure medications! Call PCP's office to talk about referral to kidney doctor (nephrologist). Keep blood pressure log on sheet of paper/in phone to review with PCP in 1 week. Call eye doctor (ophthalmologist) to schedule eye exam. Go to ER if you develop change in vision, fever, chest pain, severe abdominal pain, or blood in your urine.

## 2019-01-23 NOTE — ED Provider Notes (Signed)
[name]    CSN: [id number] Arrival date & time: 01/23/19  0841      History   Chief Complaint Chief Complaint  Patient presents with  . Nasal Congestion    HPI Denise Carter is a 48 y.o. female with history of hypertension, thyroid disease, lupus, RES presenting for week long course of intermittent temporal headache.  Denies aggravating, alleviating factors.  Has not tried anything for symptoms.  Also noting nasal congestion, sinus pressure for about the same time.  Patient states she has a history of sinus infections, states this feels similar.  Of note, patient is hypertensive: Has not yet taken her blood pressure medications today, did smoke cigarette PTA.  Patient states that she will occasionally get headaches with high blood pressure, though those tend to be occipital.  Patient insists this headache is more consistent with sinus infections.  Patient denies visual changes, chest pain, shortness of breath.  Of note patient has not seen a nephrologist for renal artery stenosis: Looking to coordinate this care with her PCP.   Past Medical History:  Diagnosis Date  . Hypertension   . Lupus (HCC)   . Thyroid disease     Patient Active Problem List   Diagnosis Date Noted  . Renal artery stenosis (HCC) 07/19/2018  . Rheumatoid arthritis without rheumatoid factor, multiple sites (HCC) 06/22/2018  . Polyarthralgia 06/22/2018  . Sjogren's disease (HCC) 06/22/2018  . Polyclonal gammopathy determined by serum protein electrophoresis 01/30/2017    Past Surgical History:  Procedure Laterality Date  . ABDOMINAL HYSTERECTOMY      OB History   No obstetric history on file.      Home Medications    Prior to Admission medications   Medication Sig Start Date End Date Taking? Authorizing Provider  albuterol (VENTOLIN HFA) 108 (90 Base) MCG/ACT inhaler Inhale 2 puffs into the lungs every 4 (four) hours as needed for wheezing or shortness of breath (cough,  shortness of breath or wheezing.). 07/19/18   Bing Neighbors, FNP  amLODipine (NORVASC) 10 MG tablet Take 1 tablet (10 mg total) by mouth daily. 10/25/18   Claiborne Rigg, NP  atorvastatin (LIPITOR) 20 MG tablet Take 1 tablet (20 mg total) by mouth daily. 07/20/18   Bing Neighbors, FNP  azithromycin (ZITHROMAX) 250 MG tablet Take 1 tablet (250 mg total) by mouth daily. Take first 2 tablets together, then 1 every day until finished. 01/23/19   Hall-Potvin, [place], PA-C  cetirizine (ZYRTEC) 10 MG tablet Take 1 tablet (10 mg total) by mouth daily. 07/05/18   Bing Neighbors, FNP  hydrochlorothiazide (HYDRODIURIL) 25 MG tablet  10/11/18   [provider]  hydroxychloroquine (PLAQUENIL) 200 MG tablet Take 1 tablet by mouth 2 (two) times a day. 06/07/18   [provider]  ipratropium (ATROVENT) 0.03 % nasal spray Place 2 sprays into both nostrils 2 (two) times daily. Patient not taking: Reported on 10/25/2018 06/07/18   Bing Neighbors, FNP  lisinopril (ZESTRIL) 20 MG tablet  10/11/18   [provider]  lisinopril-hydrochlorothiazide (ZESTORETIC) 20-25 MG tablet Take 1 tablet by mouth daily. 07/19/18   Bing Neighbors, FNP  pregabalin (LYRICA) 150 MG capsule Take 1 capsule (150 mg total) by mouth 2 (two) times daily. 10/25/18 11/24/18  Claiborne Rigg, NP  varenicline (CHANTIX CONTINUING MONTH PAK) 1 MG tablet Take 1 tablet (1 mg total) by mouth 2 (two) times daily. 10/25/18 01/23/19  Claiborne Rigg, NP    Family  History Family History  Problem Relation Age of Onset  . Hypertension Mother     Social History Social History   Tobacco Use  . Smoking status: Current Every Day Smoker    Packs/day: 1.00  . Smokeless tobacco: Never Used  Substance Use Topics  . Alcohol use: No  . Drug use: No     Allergies   Labetalol   Review of Systems Review of Systems  Constitutional: Negative for activity change, appetite change, diaphoresis, fatigue and fever.    HENT: Positive for congestion, sinus pressure and sinus pain. Negative for dental problem, ear pain, facial swelling, hearing loss, sore throat, trouble swallowing and voice change.   Eyes: Negative for photophobia, pain, discharge, redness, itching and visual disturbance.  Respiratory: Negative for cough, chest tightness and shortness of breath.   Cardiovascular: Negative for chest pain and palpitations.  Gastrointestinal: Negative for abdominal pain, blood in stool, diarrhea, nausea, rectal pain and vomiting.  Genitourinary: Negative for dysuria and hematuria.  Musculoskeletal: Negative for arthralgias and myalgias.  Neurological: Positive for headaches. Negative for dizziness, tremors, seizures, syncope, facial asymmetry, speech difficulty, weakness, light-headedness and numbness.     Physical Exam Triage Vital Signs ED Triage Vitals  Enc Vitals Group     BP      Pulse      Resp      Temp      Temp src      SpO2      Weight      Height      Head Circumference      Peak Flow      Pain Score      Pain Loc      Pain Edu?      Excl. in GC?    No data found.  Updated Vital Signs BP (!) 192/131 (BP Location: Left Arm)   Pulse 84   Temp 97.6 F (36.4 C) (Temporal)   Resp 16   SpO2 97%   Visual Acuity Right Eye Distance:   Left Eye Distance:   Bilateral Distance:    Right Eye Near:   Left Eye Near:    Bilateral Near:     Physical Exam Constitutional:      General: She is not in acute distress.    Appearance: She is obese. She is not ill-appearing.  HENT:     Head: Normocephalic and atraumatic.     Right Ear: Tympanic membrane, ear canal and external ear normal.     Left Ear: Tympanic membrane, ear canal and external ear normal.     Nose:     Comments: Turbinates edematous, injected with mucoid discharge.  Positive for bilateral sinus tenderness over frontal, maxillary sinuses.    Mouth/Throat:     Mouth: Mucous membranes are moist.     Pharynx: Oropharynx is  clear. No oropharyngeal exudate or posterior oropharyngeal erythema.  Eyes:     General: No scleral icterus.    Extraocular Movements: Extraocular movements intact.     Conjunctiva/sclera: Conjunctivae normal.     Pupils: Pupils are equal, round, and reactive to light.  Neck:     Vascular: No carotid bruit.     Comments: Right sided, shotty LAD Cardiovascular:     Rate and Rhythm: Normal rate and regular rhythm.     Heart sounds: No murmur. No gallop.   Pulmonary:     Effort: Pulmonary effort is normal. No respiratory distress.     Breath sounds: No wheezing or rales.  Abdominal:     General: Abdomen is flat. Bowel sounds are normal.     Tenderness: There is no abdominal tenderness. There is no right CVA tenderness or left CVA tenderness.  Musculoskeletal:     Cervical back: Neck supple. Tenderness present.  Lymphadenopathy:     Cervical: Cervical adenopathy present.  Skin:    Capillary Refill: Capillary refill takes less than 2 seconds.     Coloration: Skin is not jaundiced or pale.  Neurological:     General: No focal deficit present.     Mental Status: She is alert and oriented to person, place, and time.     Cranial Nerves: No cranial nerve deficit.     Sensory: No sensory deficit.     Motor: No weakness.     Coordination: Coordination normal.     Gait: Gait normal.     Deep Tendon Reflexes: Reflexes normal.  Psychiatric:        Mood and Affect: Mood normal.        Behavior: Behavior normal.        Thought Content: Thought content normal.        Judgment: Judgment normal.      UC Treatments / Results  Labs (all labs ordered are listed, but only abnormal results are displayed) Labs Reviewed  NOVEL CORONAVIRUS, NAA    EKG   Radiology No results found.  Procedures Procedures (including critical care time)  Medications Ordered in UC Medications - No data to display  Initial Impression / Assessment and Plan / UC Course  I have reviewed the triage vital  signs and the nursing notes.  Pertinent labs & imaging results that were available during my care of the patient were reviewed by me and considered in my medical decision making (see chart for details).     Patient afebrile, nontoxic in office.  No neurocognitive deficit on exam.  H&P more concerning for sinusitis related headache as opposed to hypertensive related despite patient's elevated blood pressure.  Patient able to check blood pressure at home: States her her readings with blood pressure medication tends to be 140/100-120.  He is going to continue working with PCP to coordinate renal care.  EKG deferred per patient today as patient is without symptoms Previous from 01/06/18 w/o qtc prolongation.  Will start azithromycin for likely sinusitis.  Covid PCR pending-patient to quarantine until results are back.  Temporal arteritis less likely as she is denying fever, visual changes, is under 31 years of age, thought strict ER return precautions thereof were discussed, patient verbalized understanding and is agreeable to plan. Final Clinical Impressions(s) / UC Diagnoses   Final diagnoses:  Nasal congestion  Temporal headache  Hypertensive urgency     Discharge Instructions     Take antibiotic as prescribed. Use Flonase once daily in each nostril and take allergy medication. Go home and take your blood pressure medications! Call PCP's office to talk about referral to kidney doctor (nephrologist). Keep blood pressure log on sheet of paper/in phone to review with PCP in 1 week. Call eye doctor (ophthalmologist) to schedule eye exam. Go to ER if you develop change in vision, fever, chest pain, severe abdominal pain, or blood in your urine.    ED Prescriptions    Medication Sig Dispense Auth. Provider   azithromycin (ZITHROMAX) 250 MG tablet Take 1 tablet (250 mg total) by mouth daily. Take first 2 tablets together, then 1 every day until finished. 6 tablet Hall-Potvin, [place], PA-C  PDMP not reviewed this encounter.   Odette FractionHall-Potvin, [place]'HanisBritt[place], [place] JerseyPA-C 01/23/19 [phone]

## 2019-01-24 ENCOUNTER — Encounter: Payer: Self-pay | Admitting: Nurse Practitioner

## 2019-01-24 ENCOUNTER — Other Ambulatory Visit: Payer: Self-pay | Admitting: Family Medicine

## 2019-01-24 NOTE — Telephone Encounter (Signed)
Referral request to Nephrologist. Please assist if able.

## 2019-01-25 LAB — NOVEL CORONAVIRUS, NAA: SARS-CoV-2, NAA: NOT DETECTED

## 2019-01-28 ENCOUNTER — Other Ambulatory Visit: Payer: Self-pay | Admitting: Nurse Practitioner

## 2019-01-29 ENCOUNTER — Other Ambulatory Visit: Payer: Self-pay

## 2019-01-29 ENCOUNTER — Ambulatory Visit (INDEPENDENT_AMBULATORY_CARE_PROVIDER_SITE_OTHER): Payer: Managed Care, Other (non HMO) | Admitting: Neurology

## 2019-01-29 ENCOUNTER — Encounter: Payer: Self-pay | Admitting: Neurology

## 2019-01-29 VITALS — BP 158/100 | HR 70 | Ht 64.0 in | Wt 174.5 lb

## 2019-01-29 DIAGNOSIS — M0550 Rheumatoid polyneuropathy with rheumatoid arthritis of unspecified site: Secondary | ICD-10-CM

## 2019-01-29 DIAGNOSIS — M329 Systemic lupus erythematosus, unspecified: Secondary | ICD-10-CM | POA: Diagnosis not present

## 2019-01-29 DIAGNOSIS — G63 Polyneuropathy in diseases classified elsewhere: Secondary | ICD-10-CM | POA: Diagnosis not present

## 2019-01-29 NOTE — Progress Notes (Signed)
[facility] Clinic Note - Initial Visit   Date: 01/29/19  Denise Carter MRN: [id number] DOB: 05/20/1970   Dear Dr. Joaquin Courts, FNP:  Thank you for your kind referral of Denise Carter for consultation of neuropathy. Although her history is well known to you, please allow [place] to reiterate it for the purpose of our medical record. The patient was accompanied to the clinic by self.   History of Present Illness: Denise Carter is a 49 y.o. right-handed female with rheumatoid arthritis, SLE, hypertension, hyperlipidemia, and tobacco abuse presenting for evaluation of neuropathy.   She moved from [place] in 2018 and was seeing a neurologist for neuropathy last in 2016. She reports having numbness and tingling of the toes and feet and had NCS/EMG of the legs in 2015 which confirmed the presence of neuropathy.  I do not have these records to review.  She was being prescribed Lyrica 150mg  which she takes (2) tablets at bedtime and provides relief. She has previously tried gabapentin.  She walks unassisted and endorses mild imbalance.  She also complains of right hand tingling and is scheduled to undergo right CTS release.    She works as as a Biochemist, clinical.  She lives with husband, daughter, son, and two grandchildren. No history of diabetes or alcohol use.  She is seeing Art gallery manager, PA at [facility] for RA and SLE and takes Simponi and plaquenil.   Out-side paper records, electronic medical record, and images have been reviewed where available and summarized as:  Lab Results  Component Value Date   HGBA1C 5.0 07/19/2018   No results found for: 07/21/2018 Lab Results  Component Value Date   TSH 1.390 07/19/2018     Past Medical History:  Diagnosis Date  . Hypertension   . Lupus (HCC)   . Thyroid disease     Past Surgical History:  Procedure Laterality Date  . ABDOMINAL HYSTERECTOMY       Medications:   Outpatient Encounter Medications as of 01/29/2019  Medication Sig  . albuterol (VENTOLIN HFA) 108 (90 Base) MCG/ACT inhaler Inhale 2 puffs into the lungs every 4 (four) hours as needed for wheezing or shortness of breath (cough, shortness of breath or wheezing.).  03/29/2019 atorvastatin (LIPITOR) 20 MG tablet Take 1 tablet (20 mg total) by mouth daily.  . cetirizine (ZYRTEC) 10 MG tablet Take 1 tablet (10 mg total) by mouth daily.  . Golimumab (SIMPONI) 50 MG/0.5ML SOSY Inject into the skin every 8 (eight) weeks.  . hydrochlorothiazide (HYDRODIURIL) 25 MG tablet   . hydroxychloroquine (PLAQUENIL) 200 MG tablet Take 1 tablet by mouth 2 (two) times a day.  . ipratropium (ATROVENT) 0.03 % nasal spray Place 2 sprays into both nostrils 2 (two) times daily.  . pregabalin (LYRICA) 150 MG capsule Take 1 capsule (150 mg total) by mouth 2 (two) times daily.  . [DISCONTINUED] amLODipine (NORVASC) 10 MG tablet Take 1 tablet (10 mg total) by mouth daily.  . [DISCONTINUED] azithromycin (ZITHROMAX) 250 MG tablet Take 1 tablet (250 mg total) by mouth daily. Take first 2 tablets together, then 1 every day until finished.  . [DISCONTINUED] lisinopril (ZESTRIL) 20 MG tablet   . [DISCONTINUED] lisinopril-hydrochlorothiazide (ZESTORETIC) 20-25 MG tablet Take 1 tablet by mouth daily.   No facility-administered encounter medications on file as of 01/29/2019.    Allergies:  Allergies  Allergen Reactions  . Labetalol     Headaches    Family History: Family History  Problem  Relation Age of Onset  . Hypertension Mother     Social History: Social History   Tobacco Use  . Smoking status: Current Every Day Smoker    Packs/day: 1.00  . Smokeless tobacco: Never Used  Substance Use Topics  . Alcohol use: No  . Drug use: No   Social History   Social History Narrative   Right handed      Highest level of edu- 12th grade      7 children      Apartment- 2nd floor    Vital Signs:  BP (!) 158/100   Pulse 70    Ht 5\' 4"  (1.626 m)   Wt 174 lb 8 oz (79.2 kg)   BMI 29.95 kg/m   Neurological Exam: MENTAL STATUS including orientation to time, place, person, recent and remote memory, attention span and concentration, language, and fund of knowledge is normal.  Speech is not dysarthric.  CRANIAL NERVES: II:  No visual field defects.    III-IV-VI: Pupils equal round and reactive to light.  Normal conjugate, extra-ocular eye movements in all directions of gaze.  No nystagmus.  No ptosis.   V:  Normal facial sensation.    VIII:  Normal hearing and vestibular function.    XI:  Normal shoulder shrug and head rotation.    MOTOR:  No atrophy, fasciculations or abnormal movements.  No pronator drift.   Upper Extremity:  Right  Left  Deltoid  5/5   5/5   Biceps  5/5   5/5   Triceps  5/5   5/5   Infraspinatus 5/5  5/5  Medial pectoralis 5/5  5/5  Wrist extensors  5/5   5/5   Wrist flexors  5/5   5/5   Finger extensors  5/5   5/5   Finger flexors  5/5   5/5   Dorsal interossei  5/5   5/5   Abductor pollicis  5/5   5/5   Tone (Ashworth scale)  0  0   Lower Extremity:  Right  Left  Hip flexors  5/5   5/5   Hip extensors  5/5   5/5   Adductor 5/5  5/5  Abductor 5/5  5/5  Knee flexors  5/5   5/5   Knee extensors  5/5   5/5   Dorsiflexors  5/5   5/5   Plantarflexors  5/5   5/5   Toe extensors  5/5   5/5   Toe flexors  5/5   5/5   Tone (Ashworth scale)  0  0   MSRs:  Right        Left                  brachioradialis 2+  2+  biceps 2+  2+  triceps 2+  2+  patellar 2+  2+  ankle jerk 2+  2+  Hoffman no  no  plantar response down  down   SENSORY:  Pin prick is reduced distal to mid-calf bilaterally into the feet.  Vibration and temperature is intact throughout.   Romberg's sign absent.   COORDINATION/GAIT: Normal finger-to- nose-finger.  Intact rapid alternating movements bilaterally.  Able to rise from a chair without using arms.  Gait narrow based and stable. Tandem and stressed gait  intact.    IMPRESSION: Probable neuropathy related to underlying autoimmune disease (RA and SLE), however, neurological exam is relatively intact, except for reduced pin prick in the legs.  Motor strength and reflexes  are normal.  Small fiber neuropathy is possible. I have requested records from her prior neurologist to review. PCP and rheumatology notes reviewed. Pain is well-controlled on Lyrica 300mg  at bedtime (she takes two 150mg  tablets) which can be continued.  Patient educated on daily foot inspection, fall prevention, and safety precautions around the home.    Return to clinic in 1 year   Thank you for allowing me to participate in patient's care.  If I can answer any additional questions, I would be pleased to do so.    Sincerely,    Davona Kinoshita K. , DO

## 2019-01-30 ENCOUNTER — Other Ambulatory Visit: Payer: Self-pay | Admitting: Nurse Practitioner

## 2019-01-30 DIAGNOSIS — G6289 Other specified polyneuropathies: Secondary | ICD-10-CM

## 2019-02-08 ENCOUNTER — Encounter: Payer: Self-pay | Admitting: Nurse Practitioner

## 2019-02-23 ENCOUNTER — Telehealth: Payer: Self-pay | Admitting: Neurology

## 2019-02-23 NOTE — Telephone Encounter (Signed)
Medical records received from [facility], Dr. Mollie Germany and summarized below:  She was establish at their clinic from 2015-2016.  She was diagnosed with lupus in October 2014 manifesting with diffuse pain and paresthesias, which improved with steroids.  Subsequently, she developed more numbness and tingling in the legs.  Her work-up involved MRI brain, cervical spine, and thoracic spine, which was normal.  Electrodiagnostic testing showed mild sensory polyneuropathy.  MRI cervical spine with and without contrast 10/31/2013: No evidence of abnormal cord signal.  Less than 1 cm nodule within the left thyroid gland  MRI thoracic spine wwo contrast 11/05/2013: No evidence of abnormal thoracic cord signal or compression.  NCS/EMG 03/18/2014: Mild predominantly sensory polyneuropathy.  Review of nerve conduction shows absent right sural and superficial peroneal sensory responses and reduced radial sensory response.  Right median, ulnar, peroneal, and tibial motor responses are normal.

## 2019-02-27 ENCOUNTER — Other Ambulatory Visit: Payer: Self-pay | Admitting: Nurse Practitioner

## 2019-02-27 DIAGNOSIS — G6289 Other specified polyneuropathies: Secondary | ICD-10-CM

## 2019-03-14 ENCOUNTER — Telehealth: Payer: Self-pay

## 2019-03-14 NOTE — Telephone Encounter (Signed)

## 2019-03-15 ENCOUNTER — Ambulatory Visit: Payer: Managed Care, Other (non HMO) | Admitting: Internal Medicine

## 2019-03-16 ENCOUNTER — Other Ambulatory Visit: Payer: Self-pay | Admitting: Nurse Practitioner

## 2019-03-16 DIAGNOSIS — G6289 Other specified polyneuropathies: Secondary | ICD-10-CM

## 2019-03-16 MED ORDER — PREGABALIN 150 MG PO CAPS: 150.0000 mg | ORAL_CAPSULE | Freq: Two times a day (BID) | ORAL | 0 refills | Status: DC

## 2019-04-13 ENCOUNTER — Ambulatory Visit: Payer: Managed Care, Other (non HMO) | Attending: Internal Medicine

## 2019-04-13 DIAGNOSIS — Z23 Encounter for immunization: Secondary | ICD-10-CM

## 2019-04-13 NOTE — Progress Notes (Signed)
   Covid-19 Vaccination Clinic  Name:  Denise Carter    MRN: [id number] DOB: 10-Jun-1970  04/13/2019  [name] was observed post Covid-19 immunization for 15 minutes without incident. She was provided with Vaccine Information Sheet and instruction to access the V-Safe system.   [name] was instructed to call 911 with any severe reactions post vaccine: Marland Kitchen Difficulty breathing  . Swelling of face and throat  . A fast heartbeat  . A bad rash all over body  . Dizziness and weakness   Immunizations Administered    Name Date Dose VIS Date Route   Pfizer COVID-19 Vaccine 04/13/2019  9:23 AM 0.3 mL 01/05/2019 Intramuscular   Manufacturer: [employer], [employer]   Lot: [id number]   NDC: [address]-7

## 2019-04-23 ENCOUNTER — Encounter: Payer: Self-pay | Admitting: Internal Medicine

## 2019-04-23 ENCOUNTER — Ambulatory Visit (INDEPENDENT_AMBULATORY_CARE_PROVIDER_SITE_OTHER): Payer: 59 | Admitting: Internal Medicine

## 2019-04-23 DIAGNOSIS — J309 Allergic rhinitis, unspecified: Secondary | ICD-10-CM

## 2019-04-23 DIAGNOSIS — G6289 Other specified polyneuropathies: Secondary | ICD-10-CM | POA: Diagnosis not present

## 2019-04-23 DIAGNOSIS — I10 Essential (primary) hypertension: Secondary | ICD-10-CM | POA: Diagnosis not present

## 2019-04-23 MED ORDER — CETIRIZINE HCL 10 MG PO TABS: 10.0000 mg | ORAL_TABLET | Freq: Every day | ORAL | 11 refills | Status: DC

## 2019-04-23 MED ORDER — FLUTICASONE PROPIONATE 50 MCG/ACT NA SUSP: 2.0000 | Freq: Every day | NASAL | 6 refills | Status: DC

## 2019-04-23 MED ORDER — PREGABALIN 150 MG PO CAPS: 150.0000 mg | ORAL_CAPSULE | Freq: Two times a day (BID) | ORAL | 0 refills | Status: DC

## 2019-04-23 NOTE — Progress Notes (Signed)
Virtual Visit via Telephone Note  I connected with Denise Carter, on 04/23/2019 at 3:35 PM by telephone due to the COVID-19 pandemic and verified that I am speaking with the correct person using two identifiers.   Consent: I discussed the limitations, risks, security and privacy concerns of performing an evaluation and management service by telephone and the availability of in person appointments. I also discussed with the patient that there may be a patient responsible charge related to this service. The patient expressed understanding and agreed to proceed.   Location of Patient: Home   Location of Provider: Clinic    Persons participating in Telemedicine visit: Denise Carter [facility] Dr. Juleen China      History of Present Illness: Patient has a visit to f/u on medications. She requests refill on her Lyrica.   Reports she is having issues with her allergies. She is taking Zyrtec and doesn't think its strong enough. She is having constant nasal congestion and a dry cough, feels like something is stuck in her throat. She has itchy eyes.    Past Medical History:  Diagnosis Date  . Hypertension   . Lupus ([place])   . Thyroid disease    Allergies  Allergen Reactions  . Labetalol     Headaches    Current Outpatient Medications on File Prior to Visit  Medication Sig Dispense Refill  . atorvastatin (LIPITOR) 20 MG tablet Take 1 tablet (20 mg total) by mouth daily. 90 tablet 3  . cetirizine (ZYRTEC) 10 MG tablet Take 1 tablet (10 mg total) by mouth daily. 30 tablet 11  . Golimumab (SIMPONI) 50 MG/0.5ML SOSY Inject into the skin every 8 (eight) weeks.    . hydrochlorothiazide (HYDRODIURIL) 25 MG tablet     . hydroxychloroquine (PLAQUENIL) 200 MG tablet Take 1 tablet by mouth 2 (two) times a day.    . pregabalin (LYRICA) 150 MG capsule Take 1 capsule (150 mg total) by mouth 2 (two) times daily. 60 capsule 0   No current facility-administered medications on file  prior to visit.    Observations/Objective: NAD. Speaking clearly.  Work of breathing normal.  Alert and oriented. Mood appropriate.   Assessment and Plan: 1. Other polyneuropathy - pregabalin (LYRICA) 150 MG capsule; Take 1 capsule (150 mg total) by mouth 2 (two) times daily.  Dispense: 180 capsule; Refill: 0  2. Allergic rhinitis, unspecified seasonality, unspecified trigger Will try nasal corticosteroid in addition to PO antihistamine.  - fluticasone (FLONASE) 50 MCG/ACT nasal spray; Place 2 sprays into both nostrils daily.  Dispense: 16 g; Refill: 6 - cetirizine (ZYRTEC) 10 MG tablet; Take 1 tablet (10 mg total) by mouth daily.  Dispense: 30 tablet; Refill: 11  3. Essential hypertension Patient reports labile BP at home. Was referred to cardiologist by previous PCP for accelerated HTN. Patient reports negative experience at that visit and would like to be seen by a different cardiologist.  - Ambulatory referral to Cardiology    Follow Up Instructions: 6 months for chronic medical conditions    I discussed the assessment and treatment plan with the patient. The patient was provided an opportunity to ask questions and all were answered. The patient agreed with the plan and demonstrated an understanding of the instructions.   The patient was advised to call back or seek an in-person evaluation if the symptoms worsen or if the condition fails to improve as anticipated.     I provided 12 minutes total of non-face-to-face time during this encounter including  median intraservice time, reviewing previous notes, investigations, ordering medications, medical decision making, coordinating care and patient verbalized understanding at the end of the visit.    Marcy Siren, D.O. Primary Care at [facility]  04/23/2019, 3:35 PM

## 2019-04-26 ENCOUNTER — Telehealth: Payer: Self-pay | Admitting: Cardiovascular Disease

## 2019-04-26 NOTE — Telephone Encounter (Signed)
That's fine

## 2019-04-26 NOTE — Telephone Encounter (Signed)
Patient is requesting to switch providers from Dr. Eden Emms to Dr. Duke Salvia for hypertension. Please advise.

## 2019-04-29 NOTE — Telephone Encounter (Signed)
ok 

## 2019-05-01 NOTE — Telephone Encounter (Signed)
Message sent to Montel Clock to arrange new patient appointment

## 2019-05-09 ENCOUNTER — Ambulatory Visit: Payer: Managed Care, Other (non HMO) | Attending: Internal Medicine

## 2019-05-09 DIAGNOSIS — Z23 Encounter for immunization: Secondary | ICD-10-CM

## 2019-05-09 NOTE — Progress Notes (Signed)
   Covid-19 Vaccination Clinic  Name:  Denise Carter    MRN: [id number] DOB: 08/17/1970  05/09/2019  [name] was observed post Covid-19 immunization for 30 minutes based on pre-vaccination screening without incident. She was provided with Vaccine Information Sheet and instruction to access the V-Safe system.   [name] was instructed to call 911 with any severe reactions post vaccine: Marland Kitchen Difficulty breathing  . Swelling of face and throat  . A fast heartbeat  . A bad rash all over body  . Dizziness and weakness   Immunizations Administered    Name Date Dose VIS Date Route   Pfizer COVID-19 Vaccine 05/09/2019  8:57 AM 0.3 mL 01/05/2019 Intramuscular   Manufacturer: [employer], [employer]   Lot: [id number]   NDC: [address]-4

## 2019-05-23 ENCOUNTER — Encounter: Payer: Self-pay | Admitting: Cardiovascular Disease

## 2019-05-23 ENCOUNTER — Ambulatory Visit (INDEPENDENT_AMBULATORY_CARE_PROVIDER_SITE_OTHER): Payer: 59 | Admitting: Cardiovascular Disease

## 2019-05-23 ENCOUNTER — Other Ambulatory Visit: Payer: Self-pay

## 2019-05-23 VITALS — BP 164/110 | HR 82 | Ht 64.0 in | Wt 182.0 lb

## 2019-05-23 DIAGNOSIS — Z5181 Encounter for therapeutic drug level monitoring: Secondary | ICD-10-CM

## 2019-05-23 DIAGNOSIS — R079 Chest pain, unspecified: Secondary | ICD-10-CM

## 2019-05-23 DIAGNOSIS — I10 Essential (primary) hypertension: Secondary | ICD-10-CM | POA: Insufficient documentation

## 2019-05-23 DIAGNOSIS — I1A0 Resistant hypertension: Secondary | ICD-10-CM

## 2019-05-23 DIAGNOSIS — R0602 Shortness of breath: Secondary | ICD-10-CM | POA: Diagnosis not present

## 2019-05-23 DIAGNOSIS — R400 Somnolence: Secondary | ICD-10-CM

## 2019-05-23 DIAGNOSIS — R0789 Other chest pain: Secondary | ICD-10-CM

## 2019-05-23 DIAGNOSIS — R0681 Apnea, not elsewhere classified: Secondary | ICD-10-CM | POA: Diagnosis not present

## 2019-05-23 DIAGNOSIS — R000 Tachycardia, unspecified: Secondary | ICD-10-CM

## 2019-05-23 DIAGNOSIS — R0683 Snoring: Secondary | ICD-10-CM

## 2019-05-23 HISTORY — DX: Resistant hypertension: I1A.0

## 2019-05-23 HISTORY — DX: Shortness of breath: R06.02

## 2019-05-23 HISTORY — DX: Other chest pain: R07.89

## 2019-05-23 HISTORY — DX: Snoring: R06.83

## 2019-05-23 HISTORY — DX: Essential (primary) hypertension: I10

## 2019-05-23 MED ORDER — VALSARTAN 160 MG PO TABS: 160.0000 mg | ORAL_TABLET | Freq: Every day | ORAL | 1 refills | Status: DC

## 2019-05-23 MED ORDER — METOPROLOL TARTRATE 100 MG PO TABS: ORAL_TABLET | ORAL | 0 refills | Status: DC

## 2019-05-23 NOTE — Patient Instructions (Addendum)
Medication Instructions:  START VALSARTAN 160 MG DAILY    Labwork: ALDOSTERONE/RENIN/METANEPHERINE TODAY   BMET 1 WEEK   YOU WILL NEED SCREENING COVID 3 DAYS PRIOR TO YOUR SLEEP STUDY ONCE YOU HAVE THE SCREENING TEST YOU WILL NEED TO QUARANTINE YOURSELF FOR 3 DAYS   Testing/Procedures: Your physician has recommended that you have a sleep study. This test records several body functions during sleep, including: brain activity, eye movement, oxygen and carbon dioxide blood levels, heart rate and rhythm, breathing rate and rhythm, the flow of air through your mouth and nose, snoring, body muscle movements, and chest and belly movement. THE OFFICE WILL CALL YOU TO SCHEDULE ONCE YOUR INSURANCE HAS APPROVED   Your physician has requested that you have cardiac CT. Cardiac computed tomography (CT) is a painless test that uses an x-ray machine to take clear, detailed pictures of your heart. For further information please visit [email or web address]. Please follow instruction sheet as given. THE OFFICE WILL CALL YOU TO SCHEDULE ONCE YOUR INSURANCE HAS APPROVED   Your physician has requested that you have an echocardiogram. Echocardiography is a painless test that uses sound waves to create images of your heart. It provides your doctor with information about the size and shape of your heart and how well your heart's chambers and valves are working. This procedure takes approximately one hour. There are no restrictions for this procedure. [place]  Follow-Up: Your physician recommends that you schedule a follow-up appointment in: [place] physician recommends that you schedule a follow-up appointment in: [address] [place], THE OFFICE WILL CALL [place] will receive a phone call from the [facility] exercise and nutrition program to schedule an initial assessment.   Special Instructions:  MONITOR YOUR BLOOD PRESSURE TWICE A DAY     Care Guide: WILL HAVE AMY CALL YOU   Your cardiac CT will be scheduled at one of the below locations:   [facility] [address] [place], [place] [address] [phone]  [place] [address] [place], [place] [address] [phone]  If scheduled at [facility], please arrive at the [facility] main entrance of [facility] 30 minutes prior to test start time. Proceed to the [facility] Radiology Department (first floor) to check-in and test prep.  If scheduled at [facility], please arrive 15 mins early for check-in and test prep.  Please follow these instructions carefully (unless otherwise directed):  Hold all erectile dysfunction medications at least 3 days (72 hrs) prior to test.  On the Night Before the Test: . Be sure to Drink plenty of water. . Do not consume any caffeinated/decaffeinated beverages or chocolate 12 hours prior to your test. . Do not take any antihistamines 12 hours prior to your test. . If you take Metformin do not take 24 hours prior to test. . If the patient has contrast allergy: ? Patient will need a prescription for Prednisone and very clear instructions (as follows): 1. Prednisone 50 mg - take 13 hours prior to test 2. Take another Prednisone 50 mg 7 hours prior to test 3. Take another Prednisone 50 mg 1 hour prior to test 4. Take Benadryl 50 mg 1 hour prior to test . Patient must complete all four doses of above prophylactic medications. . Patient will need a ride after test due to Benadryl.  On the Day of the Test: . Drink plenty of water. Do not drink any water within one hour of the test. . Do not eat any food 4 hours prior to the test. . You may take your regular medications prior to the test.  . Take metoprolol (Lopressor) two hours prior to test. . HOLD Furosemide/Hydrochlorothiazide morning of the test. . FEMALES- please wear  underwire-free bra if available      After the Test: . Drink plenty of water. . After receiving IV contrast, you may experience a mild flushed feeling. This is normal. . On occasion, you may experience a mild rash up to 24 hours after the test. This is not dangerous. If this occurs, you can take Benadryl 25 mg and increase your fluid intake. . If you experience trouble breathing, this can be serious. If it is severe call 911 IMMEDIATELY. If it is mild, please call our office. . If you take any of these medications: Glipizide/Metformin, Avandament, Glucavance, please do not take 48 hours after completing test unless otherwise instructed.   Once we have confirmed authorization from your insurance company, we will call you to set up a date and time for your test.   For non-scheduling related questions, please contact the cardiac imaging nurse navigator should you have any questions/concerns: Rockwell Alexandria, RN Navigator Cardiac Imaging [facility] [phone] office  For scheduling needs, including cancellations and rescheduling, please call [phone].    Cardiac CT Angiogram A cardiac CT angiogram is a procedure to look at the heart and the area around the heart. It may be done to help find the cause of chest pains or other symptoms of heart disease. During this procedure, a substance called contrast dye is injected into the blood vessels in the area to be checked. A large X-ray machine, called a CT scanner, then takes detailed pictures of the heart and the surrounding area. The procedure is also sometimes called a coronary CT angiogram, coronary artery scanning, or CTA. A cardiac CT angiogram allows the health care provider to see how well blood is flowing to and from the heart. The health care provider will be able to see if there are any problems, such as:  Blockage or narrowing of the coronary arteries in the heart.  Fluid around the heart.  Signs of  weakness or disease in the muscles, valves, and tissues of the heart. Tell a health care provider about:  Any allergies you have. This is especially important if you have had a previous allergic reaction to contrast dye.  All medicines you are taking, including vitamins, herbs, eye drops, creams, and over-the-counter medicines.  Any blood disorders you have.  Any surgeries you have had.  Any medical conditions you have.  Whether you are pregnant or may be pregnant.  Any anxiety disorders, chronic pain, or other conditions you have that may increase your stress or prevent you from lying still. What are the risks? Generally, this is a safe procedure. However, problems may occur, including:  Bleeding.  Infection.  Allergic reactions to medicines or dyes.  Damage to other structures or organs.  Kidney damage from the contrast dye that is used.  Increased risk of cancer from radiation exposure. This risk is low. Talk with your health care provider about: ? The risks and benefits of testing. ? How you can receive the lowest dose of radiation. What happens before the procedure?  Wear comfortable clothing and remove any jewelry, glasses, dentures, and hearing  aids.  Follow instructions from your health care provider about eating and drinking. This may include: ? For 12 hours before the procedure -- avoid caffeine. This includes tea, coffee, soda, energy drinks, and diet pills. Drink plenty of water or other fluids that do not have caffeine in them. Being well hydrated can prevent complications. ? For 4-6 hours before the procedure -- stop eating and drinking. The contrast dye can cause nausea, but this is less likely if your stomach is empty.  Ask your health care provider about changing or stopping your regular medicines. This is especially important if you are taking diabetes medicines, blood thinners, or medicines to treat problems with erections (erectile dysfunction). What  happens during the procedure?   Hair on your chest may need to be removed so that small sticky patches called electrodes can be placed on your chest. These will transmit information that helps to monitor your heart during the procedure.  An IV will be inserted into one of your veins.  You might be given a medicine to control your heart rate during the procedure. This will help to ensure that good images are obtained.  You will be asked to lie on an exam table. This table will slide in and out of the CT machine during the procedure.  Contrast dye will be injected into the IV. You might feel warm, or you may get a metallic taste in your mouth.  You will be given a medicine called nitroglycerin. This will relax or dilate the arteries in your heart.  The table that you are lying on will move into the CT machine tunnel for the scan.  The person running the machine will give you instructions while the scans are being done. You may be asked to: ? Keep your arms above your head. ? Hold your breath. ? Stay very still, even if the table is moving.  When the scanning is complete, you will be moved out of the machine.  The IV will be removed. The procedure may vary among health care providers and hospitals. What can I expect after the procedure? After your procedure, it is common to have:  A metallic taste in your mouth from the contrast dye.  A feeling of warmth.  A headache from the nitroglycerin. Follow these instructions at home:  Take over-the-counter and prescription medicines only as told by your health care provider.  If you are told, drink enough fluid to keep your urine pale yellow. This will help to flush the contrast dye out of your body.  Most people can return to their normal activities right after the procedure. Ask your health care provider what activities are safe for you.  It is up to you to get the results of your procedure. Ask your health care provider, or the  department that is doing the procedure, when your results will be ready.  Keep all follow-up visits as told by your health care provider. This is important. Contact a health care provider if:  You have any symptoms of allergy to the contrast dye. These include: ? Shortness of breath. ? Rash or hives. ? A racing heartbeat. Summary  A cardiac CT angiogram is a procedure to look at the heart and the area around the heart. It may be done to help find the cause of chest pains or other symptoms of heart disease.  During this procedure, a large X-ray machine, called a CT scanner, takes detailed pictures of the heart and the surrounding area after  a contrast dye has been injected into blood vessels in the area.  Ask your health care provider about changing or stopping your regular medicines before the procedure. This is especially important if you are taking diabetes medicines, blood thinners, or medicines to treat erectile dysfunction.  If you are told, drink enough fluid to keep your urine pale yellow. This will help to flush the contrast dye out of your body. This information is not intended to replace advice given to you by your health care provider. Make sure you discuss any questions you have with your health care provider. Document Revised: 09/06/2018 Document Reviewed: 09/06/2018 Elsevier Patient Education  2020 [employer].   Echocardiogram An echocardiogram is a procedure that uses painless sound waves (ultrasound) to produce an image of the heart. Images from an echocardiogram can provide important information about:  Signs of coronary artery disease (CAD).  Aneurysm detection. An aneurysm is a weak or damaged part of an artery wall that bulges out from the normal force of blood pumping through the body.  Heart size and shape. Changes in the size or shape of the heart can be associated with certain conditions, including heart failure, aneurysm, and CAD.  Heart muscle  function.  Heart valve function.  Signs of a past heart attack.  Fluid buildup around the heart.  Thickening of the heart muscle.  A tumor or infectious growth around the heart valves. Tell a health care provider about:  Any allergies you have.  All medicines you are taking, including vitamins, herbs, eye drops, creams, and over-the-counter medicines.  Any blood disorders you have.  Any surgeries you have had.  Any medical conditions you have.  Whether you are pregnant or may be pregnant. What are the risks? Generally, this is a safe procedure. However, problems may occur, including:  Allergic reaction to dye (contrast) that may be used during the procedure. What happens before the procedure? No specific preparation is needed. You may eat and drink normally. What happens during the procedure?   An IV tube may be inserted into one of your veins.  You may receive contrast through this tube. A contrast is an injection that improves the quality of the pictures from your heart.  A gel will be applied to your chest.  A wand-like tool (transducer) will be moved over your chest. The gel will help to transmit the sound waves from the transducer.  The sound waves will harmlessly bounce off of your heart to allow the heart images to be captured in real-time motion. The images will be recorded on a computer. The procedure may vary among health care providers and hospitals. What happens after the procedure?  You may return to your normal, everyday life, including diet, activities, and medicines, unless your health care provider tells you not to do that. Summary  An echocardiogram is a procedure that uses painless sound waves (ultrasound) to produce an image of the heart.  Images from an echocardiogram can provide important information about the size and shape of your heart, heart muscle function, heart valve function, and fluid buildup around your heart.  You do not need to do  anything to prepare before this procedure. You may eat and drink normally.  After the echocardiogram is completed, you may return to your normal, everyday life, unless your health care provider tells you not to do that. This information is not intended to replace advice given to you by your health care provider. Make sure you discuss any questions you  have with your health care provider. Document Revised: 05/04/2018 Document Reviewed: 02/14/2016 Elsevier Patient Education  2020 Elsevier Inc.   Sleep Studies A sleep study (polysomnogram) is a series of tests done while you are sleeping. A sleep study records your brain waves, heart rate, breathing rate, oxygen level, and eye and leg movements. A sleep study helps your health care provider:  See how well you sleep.  Diagnose a sleep disorder.  Determine how severe your sleep disorder is.  Create a plan to treat your sleep disorder. Your health care provider may recommend a sleep study if you:  Feel sleepy on most days.  Snore loudly while sleeping.  Have unusual behaviors while you sleep, such as walking.  Have brief periods in which you stop breathing during sleep (sleepapnea).  Fall asleep suddenly during the day (narcolepsy).  Have trouble falling asleep or staying asleep (insomnia).  Feel like you need to move your legs when trying to fall asleep (restless legs syndrome).  Move your legs by flexing and extending them regularly while asleep (periodic limb movement disorder).  Act out your dreams while you sleep (sleep behavior disorder).  Feel like you cannot move when you first wake up (sleep paralysis). What tests are part of a sleep study? Most sleep studies record the following during sleep:  Brain activity.  Eye movements.  Heart rate and rhythm.  Breathing rate and rhythm.  Blood-oxygen level.  Blood pressure.  Chest and belly movement as you breathe.  Arm and leg movements.  Snoring or other  noises.  Body position. Where are sleep studies done? Sleep studies are done at sleep centers. A sleep center may be inside a hospital, office, or clinic. The room where you have the study may look like a hospital room or a hotel room. The health care providers doing the study may come in and out of the room during the study. Most of the time, they will be in another room monitoring your test as you sleep. How are sleep studies done? Most sleep studies are done during a normal period of time for a full night of sleep. You will arrive at the study center in the evening and go home in the morning. Before the test  Bring your pajamas and toothbrush with you to the sleep study.  Do not have caffeine on the day of your sleep study.  Do not drink alcohol on the day of your sleep study.  Your health care provider will let you know if you should stop taking any of your regular medicines before the test. During the test      Round, sticky patches with sensors attached to recording wires (electrodes) are placed on your scalp, face, chest, and limbs.  Wires from all the electrodes and sensors run from your bed to a computer. The wires can be taken off and put back on if you need to get out of bed to go to the bathroom.  A sensor is placed over your nose to measure airflow.  A finger clip is put on your finger or ear to measure your blood oxygen level (pulse oximetry).  A belt is placed around your belly and a belt is placed around your chest to measure breathing movements.  If you have signs of the sleep disorder called sleep apnea during your test, you may get a treatment mask to wear for the second half of the night. ? The mask provides positive airway pressure (PAP) to help you breathe better  during sleep. This may greatly improve your sleep apnea. ? You will then have all tests done again with the mask in place to see if your measurements and recordings change. After the test  A medical  doctor who specializes in sleep will evaluate the results of your sleep study and share them with you and your primary health care provider.  Based on your results, your medical history, and a physical exam, you may be diagnosed with a sleep disorder, such as: ? Sleep apnea. ? Restless legs syndrome. ? Sleep-related behavior disorder. ? Sleep-related movement disorders. ? Sleep-related seizure disorders.  Your health care team will help determine your treatment options based on your diagnosis. This may include: ? Improving your sleep habits (sleep hygiene). ? Wearing a continuous positive airway pressure (CPAP) or bi-level positive airway pressure (BPAP) mask. ? Wearing an oral device at night to improve breathing and reduce snoring. ? Taking medicines. Follow these instructions at home:  Take over-the-counter and prescription medicines only as told by your health care provider.  If you are instructed to use a CPAP or BPAP mask, make sure you use it nightly as directed.  Make any lifestyle changes that your health care provider recommends.  If you were given a device to open your airway while you sleep, use it only as told by your health care provider.  Do not use any tobacco products, such as cigarettes, chewing tobacco, and e-cigarettes. If you need help quitting, ask your health care provider.  Keep all follow-up visits as told by your health care provider. This is important. Summary  A sleep study (polysomnogram) is a series of tests done while you are sleeping. It shows how well you sleep.  Most sleep studies are done over one full night of sleep. You will arrive at the study center in the evening and go home in the morning.  If you have signs of the sleep disorder called sleep apnea during your test, you may get a treatment mask to wear for the second half of the night.  A medical doctor who specializes in sleep will evaluate the results of your sleep study and share them  with your primary health care provider. This information is not intended to replace advice given to you by your health care provider. Make sure you discuss any questions you have with your health care provider. Document Revised: 06/28/2018 Document Reviewed: 02/08/2017 Elsevier Patient Education  2020 [employer].

## 2019-05-23 NOTE — Progress Notes (Signed)
Cardiology Office Note   Date:  05/23/2019   ID:  Denise Carter, Denise Carter 1970-10-28, MRN [id number]  PCP:  Scot Jun, FNP  Cardiologist:   Skeet Latch, MD   No chief complaint on file.    History of Present Illness: Denise Carter is a 49 y.o. female with hypertension, SLE, rheumatoid arthritis, polyclonal gammopathy, Sjogren's disease, and tobacco abuse who is being seen today for Denise evaluation of hypertension.  She first found out she had hypertension in 2015 when she was diagnosed with SLE.  Her BP hasn't ever been well-controlled.   She previously saw Dr. Johnsie Cancel 12/2017 for hypertension.  Her blood pressure at that time was around 606 systolic.  She was referred for renal artery Dopplers that were normal.  Labetalol was increased to 300 mg twice daily.  She was seen in Denise ED 12/2018 with headache and sinus congestion.  Her blood pressure at that time was 192/131.  She works at [employer] and walks a lot at work.  She has random episodes of chest pain and diaphoresis.  Denise pain is on her R side and under her R breast.  It starts sharp and then subsides.  It is sometimes associated with heart racing.  It seems to occur mostly at rest and regularly with exertion.  She does not get any formal exercise outside of work.  She recently started noticing shortness of breath when walking.  She wonders if it could be related to being overweight or her smoking.  She continues to smoke 1 pack of cigarettes daily.  In Denise past she tried quitting with Chantix, gum, and she thinks she used Wellbutrin.    Denise Carter has been under a lot of stress because of her son.  He is still living in [place] and gets into a lot of trouble with Denise law.  She has a hard time sleeping at night.  Her husband notes that she snores.  She does not think she has apneic episodes.  She does not feel rested in Denise morning and has daytime somnolence.  She drinks 3 coffees in Denise morning and 1 Coke in Denise afternoon.  She  mostly cooks at home and tries to limit her sodium intake.  However she only has 1 meal daily.   Previous antihypertensives: Lisinopril HCTZ Verapamil Amlodipine - headache labetalolol- didn't work Atenolol worked some in [place].  Past Medical History:  Diagnosis Date  . Atypical chest pain 05/23/2019  . Essential hypertension 05/23/2019  . Hypertension   . Lupus ([place])   . Shortness of breath 05/23/2019  . Snoring 05/23/2019  . Thyroid disease     Past Surgical History:  Procedure Laterality Date  . ABDOMINAL HYSTERECTOMY       Current Outpatient Medications  Medication Sig Dispense Refill  . atorvastatin (LIPITOR) 20 MG tablet Take 1 tablet (20 mg total) by mouth daily. 90 tablet 3  . cetirizine (ZYRTEC) 10 MG tablet Take 1 tablet (10 mg total) by mouth daily. 30 tablet 11  . fluticasone (FLONASE) 50 MCG/ACT nasal spray Place 2 sprays into both nostrils daily. 16 g 6  . Golimumab (SIMPONI) 50 MG/0.5ML SOSY Inject into Denise skin every 8 (eight) weeks.    . hydrochlorothiazide (HYDRODIURIL) 25 MG tablet     . hydroxychloroquine (PLAQUENIL) 200 MG tablet Take 1 tablet by mouth 2 (two) times a day.    . pregabalin (LYRICA) 150 MG capsule Take 1 capsule (150 mg total) by mouth 2 (  two) times daily. 180 capsule 0  . metoprolol tartrate (LOPRESSOR) 100 MG tablet TAKE 1 TABLET 2 HOURS PRIOR TO CT 1 tablet 0  . valsartan (DIOVAN) 160 MG tablet Take 1 tablet (160 mg total) by mouth daily. 90 tablet 1   No current facility-administered medications for this visit.    Allergies:   Labetalol    Social History:  Denise Carter  reports that she has been smoking. She has been smoking about 1.00 pack per day. She has never used smokeless tobacco. She reports that she does not drink alcohol or use drugs.   Family History:  Denise Carter's family history includes CAD in her maternal grandmother; Heart failure in her maternal grandmother; Hypertension in her maternal grandmother and mother; Lupus in  her maternal grandmother; Stroke in her maternal grandmother.    ROS:  Please see Denise history of present illness.   Otherwise, review of systems are positive for none.   All other systems are reviewed and negative.    PHYSICAL EXAM: VS:  BP (!) 164/110   Pulse 82   Ht 5\' 4"  (1.626 m)   Wt 182 lb (82.6 kg)   BMI 31.24 kg/m  , BMI Body mass index is 31.24 kg/m. GENERAL:  Well appearing HEENT:  Pupils equal round and reactive, fundi not visualized, oral mucosa unremarkable NECK:  No jugular venous distention, waveform within normal limits, carotid upstroke brisk and symmetric, no bruits, mild thyromegaly LUNGS:  Clear to auscultation bilaterally HEART:  RRR.  PMI not displaced or sustained,S1 and S2 within normal limits, no S3, no S4, no clicks, no rubs, no murmurs ABD:  Flat, positive bowel sounds normal in frequency in pitch, no bruits, no rebound, no guarding, no midline pulsatile mass, no hepatomegaly, no splenomegaly EXT:  2 plus pulses throughout, no edema, no cyanosis no clubbing SKIN:  No rashes no nodules NEURO:  Cranial nerves II through XII grossly intact, motor grossly intact throughout PSYCH:  Cognitively intact, oriented to person place and time   EKG:  EKG is ordered today. Denise ekg ordered today demonstrates sinus rhythm.  Rate 82 bpm.  Nonspecific ST changes.  LAFB.  Cannot rule out anteroseptal infarct    Recent Labs: 07/19/2018: ALT 6; BUN 11; Creatinine, Ser 0.79; Potassium 3.6; Sodium 142; TSH 1.390    Lipid Panel    Component Value Date/Time   CHOL 174 10/25/2018 0913   TRIG 73 10/25/2018 0913   HDL 57 10/25/2018 0913   CHOLHDL 3.1 10/25/2018 0913   LDLCALC 103 (H) 10/25/2018 0913      Wt Readings from Last 3 Encounters:  05/23/19 182 lb (82.6 kg)  01/29/19 174 lb 8 oz (79.2 kg)  10/25/18 174 lb (78.9 kg)      ASSESSMENT AND PLAN:  # Essential hypertension:  Blood pressure has been very difficult to control.  Right now she is only on  hydrochlorothiazide.  We will add valsartan 160 mg daily.  She will need a basic metabolic panel checked in 1 week.  She will be enrolled in Denise hypertension clinic.  she will also be enrolled in Denise PREP program through [facility] where she will get exercise and nutrition coaching.  She has been assessed for SDOH needs which will be addressed by our Care Guide and Social Work team as needed.  She consents to be monitored in our remote Carter monitoring program through [employer].  she will track his blood pressure twice daily and understands that these trends will help  [place] to adjust her medications as needed prior to his next appointment.  she is interested in enrolling in Denise PREP exercise and nutrition program through Den[facility].  Check plasma metanephrines given her report of tachycardia, diaphoresis, and headaches.  Check plasma renin and hypokalemia.  Recommended that she limit her caffeine intake.  We will also set her up for a sleep study given her snoring and daytime somnolence.    # Chest pain:  Symptoms are atypical but she has several risk factors.  We will get a coronary CT-a to better assess.  # Shortness of breath:  No evidence of heart failure on exam.  I worry that she can have significant LVH.  Poorly controlled on high blood pressure.  We will get an echo.  # Tobacco abuse:  We will get our Care Guide to work with her for smoking cessation.    Current medicines are reviewed at length with Denise Carter today.  Denise Carter does not have concerns regarding medicines.  Denise following changes have been made: Add hydrochlorothiazide 12.5 mg daily  Labs/ tests ordered today include:   Orders Placed This Encounter  Procedures  . CT CORONARY MORPH W/CTA COR W/SCORE W/CA W/CM &/OR WO/CM  . CT CORONARY FRACTIONAL FLOW RESERVE DATA PREP  . CT CORONARY FRACTIONAL FLOW RESERVE FLUID ANALYSIS  . Aldosterone + renin activity w/ ratio  . Metanephrines, plasma  . Basic metabolic panel  . EKG 12-Lead   . ECHOCARDIOGRAM COMPLETE  . Split night study     Disposition:   FU with Pharm.D. in 1 month.  Giavana Rooke C. Duke Salvia, MD, [facility] in 4 months.    Signed, Donyea Beverlin C. Duke Salvia, MD, [facility]  05/23/2019 11:51 AM    [facility]

## 2019-05-23 NOTE — Addendum Note (Signed)
Addended by: Chilton Si C on: 05/23/2019 11:54 AM   Modules accepted: Orders

## 2019-05-25 ENCOUNTER — Telehealth: Payer: Self-pay

## 2019-05-25 NOTE — Telephone Encounter (Signed)
Called reference referral to [facility]P from [facility]. Described program to patient. Is interested however works during the day. Needs and evening class.  Explained do not have an evening class currently but will be developing one soon.  Will call her back when have times/dates arranged. Patient agreeable.

## 2019-05-30 LAB — ALDOSTERONE + RENIN ACTIVITY W/ RATIO
ALDOS/RENIN RATIO: >18.6 (ref 0.0–30.0)
ALDOSTERONE: 3.1 ng/dL (ref 0.0–30.0)
Renin: <0.167 ng/mL/hr — ABNORMAL LOW (ref 0.167–5.380)

## 2019-05-30 LAB — METANEPHRINES, PLASMA
Metanephrine, Free: 51.8 pg/mL (ref 0.0–88.0)
Normetanephrine, Free: 103.3 pg/mL (ref 0.0–136.8)

## 2019-06-01 ENCOUNTER — Other Ambulatory Visit: Payer: Self-pay | Admitting: Pain Medicine

## 2019-06-01 DIAGNOSIS — G8929 Other chronic pain: Secondary | ICD-10-CM

## 2019-06-01 DIAGNOSIS — M545 Low back pain, unspecified: Secondary | ICD-10-CM

## 2019-06-11 ENCOUNTER — Telehealth (HOSPITAL_COMMUNITY): Payer: Self-pay | Admitting: *Deleted

## 2019-06-11 ENCOUNTER — Other Ambulatory Visit: Payer: Self-pay

## 2019-06-11 ENCOUNTER — Ambulatory Visit (INDEPENDENT_AMBULATORY_CARE_PROVIDER_SITE_OTHER): Payer: 59

## 2019-06-11 DIAGNOSIS — Z7189 Other specified counseling: Secondary | ICD-10-CM

## 2019-06-11 NOTE — Telephone Encounter (Signed)
Attempted to call patient regarding upcoming cardiac CT appointment. Left message on voicemail with name and callback number  Devine Dant Tai RN Navigator Cardiac [facility] [phone] Office [phone] Cell  

## 2019-06-11 NOTE — Patient Instructions (Signed)
Pt will work on incorporating 59mins-1hour of personal time that involves an activity of their choice to help decompress. Pt will also work on setting boundaries to reduce the amount of stress they encounter.

## 2019-06-11 NOTE — Progress Notes (Addendum)
Pt came in for initial health coaching visit to discuss goals regarding stress management. Pt states that smoking is related to stress. Pt will work start working on reducing stress first by incorporating 51mins-1hour of personal time that involves an activity of their choice to help decompress. Pt will also work on setting boundaries to reduce the amount of stress they encounter. Pt discussed SDOH needs with Care Guide that resources may be identified for assistance. Care Guide will be in contact with pt to connect them to any identified resources.

## 2019-06-12 ENCOUNTER — Telehealth: Payer: Self-pay | Admitting: *Deleted

## 2019-06-12 ENCOUNTER — Ambulatory Visit (HOSPITAL_COMMUNITY)
Admission: RE | Admit: 2019-06-12 | Discharge: 2019-06-12 | Disposition: A | Discharge status: Home / Self Care | Payer: 59 | Source: Ambulatory Visit | Attending: Cardiovascular Disease | Admitting: Cardiovascular Disease

## 2019-06-12 ENCOUNTER — Telehealth: Payer: Self-pay

## 2019-06-12 DIAGNOSIS — Z006 Encounter for examination for normal comparison and control in clinical research program: Secondary | ICD-10-CM

## 2019-06-12 DIAGNOSIS — R0602 Shortness of breath: Secondary | ICD-10-CM | POA: Insufficient documentation

## 2019-06-12 DIAGNOSIS — R079 Chest pain, unspecified: Secondary | ICD-10-CM | POA: Diagnosis present

## 2019-06-12 DIAGNOSIS — I10 Essential (primary) hypertension: Secondary | ICD-10-CM | POA: Diagnosis present

## 2019-06-12 DIAGNOSIS — R7989 Other specified abnormal findings of blood chemistry: Secondary | ICD-10-CM

## 2019-06-12 MED ORDER — NITROGLYCERIN 0.4 MG SL SUBL: 0.8000 mg | SUBLINGUAL_TABLET | SUBLINGUAL | Status: DC | PRN

## 2019-06-12 MED ORDER — NITROGLYCERIN 0.4 MG SL SUBL
SUBLINGUAL_TABLET | SUBLINGUAL | Status: AC
  Administered 2019-06-12: 0.8 mg via SUBLINGUAL
  Filled 2019-06-12: qty 2

## 2019-06-12 MED ORDER — IOHEXOL 350 MG/ML SOLN
80.0000 mL | Freq: Once | INTRAVENOUS | Status: AC | PRN
  Administered 2019-06-12: 80 mL via INTRAVENOUS

## 2019-06-12 NOTE — Research (Signed)
Cadfem Informed Consent    Patient Name: Denise Carter    Subject met inclusion and exclusion criteria.  The informed consent form, study requirements and expectations were reviewed with the subject and questions and concerns were addressed prior to the signing of the consent form.  The subject verbalized understanding of the trail requirements.  The subject agreed to participate in the CADFEM trial and signed the informed consent.  The informed consent was obtained prior to performance of any protocol-specific procedures for the subject.  A copy of the signed informed consent was given to the subject and a copy was placed in the subject's medical record.   Neva Seat

## 2019-06-12 NOTE — Telephone Encounter (Signed)
Called pt to discuss community resource for concern. Pt will contact the [facility] for further assistance.

## 2019-06-12 NOTE — Telephone Encounter (Signed)
-----   Message from Chilton Si, MD sent at 06/11/2019 12:06 PM EDT ----- This test does not confirm hyperaldosteronism as the cause of her high blood pressure, but it is abnormal.  Sometimes this can be seen in a disorder called Cushing's syndrome.  Recommend that she be evaluated by endocrine and tested for this.

## 2019-06-12 NOTE — Progress Notes (Signed)
The patient has tolerated the CT heart scan without difficulty. It is noted post scan that her blood pressures have continued to elevate 212/114. The patient reports taking all prescribed mediations and that this has been her normal bp readings. No distress noted ambulatory, she has a headache which she states "I have not had any caffeine".

## 2019-06-12 NOTE — Telephone Encounter (Signed)
Advised patient, verbalized understanding  Referral placed  

## 2019-06-13 ENCOUNTER — Other Ambulatory Visit: Payer: Self-pay

## 2019-06-13 ENCOUNTER — Ambulatory Visit (HOSPITAL_COMMUNITY): Payer: 59 | Attending: Cardiovascular Disease

## 2019-06-13 DIAGNOSIS — R079 Chest pain, unspecified: Secondary | ICD-10-CM | POA: Diagnosis present

## 2019-06-13 DIAGNOSIS — R0602 Shortness of breath: Secondary | ICD-10-CM

## 2019-06-15 ENCOUNTER — Telehealth: Payer: Self-pay

## 2019-06-15 ENCOUNTER — Telehealth: Payer: Self-pay | Admitting: Cardiovascular Disease

## 2019-06-15 NOTE — Telephone Encounter (Signed)
New Message    Pt is calling and is wondering if she needs to make an appt for after her testing to go over the results  She would like for [facility] to call her     Please call

## 2019-06-15 NOTE — Telephone Encounter (Signed)
Spoke with pt, she was concerned because she read the echo results on my chart. Aware has not been read by the provider yet but the preliminary report looks good. Questions regarding echo answered. She wanted to make sure we knew she does not have a follow up with dr Duke Salvia. Her future appointments are with the care quide. Aware if she needs to be seen we will let her know. Aware melinda and dr Duke Salvia are out of the office.

## 2019-06-15 NOTE — Telephone Encounter (Signed)
Call to [facility] for precert request of Split Night Sleep Study, information provided and clinicals faxed. Case # [id number].  In Home sleep study approval requested and received.  Case #[id number].

## 2019-06-18 NOTE — Telephone Encounter (Signed)
In home sleep study valid from 06/15/19 thru 09/13/19, Auth #[id number].  Messaged Dr Duke Salvia to see if home study is acceptable.

## 2019-06-19 ENCOUNTER — Telehealth: Payer: Self-pay | Admitting: Cardiovascular Disease

## 2019-06-19 DIAGNOSIS — R0683 Snoring: Secondary | ICD-10-CM

## 2019-06-19 DIAGNOSIS — R400 Somnolence: Secondary | ICD-10-CM

## 2019-06-19 NOTE — Telephone Encounter (Signed)
   Pt calling, she said she's having misunderstand on her mychart messages. Her question is what recommendation from Dr. Duke Salvia she can do to help her with her heart issue related to her recent echo results. She saw Dr. Duke Salvia notes to do diet and lose weight but she doesn't know what kind of diet she needs to do or if Dr. Duke Salvia will give her prescription  Please advise

## 2019-06-19 NOTE — Telephone Encounter (Signed)
Spoke with patient and explained yes she needed to work on diet and exercise Per patient she does NOT eat and has diarrhea all the time.  Diarrhea started after starting Valsartan  Blood pressure still running 140's-150's/100's-110's  Discussed echo results with patient, Victorio Palm RN also reviewed with patient last week.  Will forward to Dr Duke Salvia and Ilda Basset D for review

## 2019-06-19 NOTE — Telephone Encounter (Signed)
Diarrhea is an uncommon side effect of valsartan, occurring in up to 2.1% of patients.  Patient would likely benefit from education on DASH diet and smoking cessation.  Recommend keeping appointment with pharmacist on 06/26/19

## 2019-06-19 NOTE — Telephone Encounter (Signed)
Advised patient, verbalized understanding  

## 2019-06-19 NOTE — Telephone Encounter (Signed)
Called and spoke with patient regarding follow up visit with Dr Duke Salvia  Explained that she would see Dr Duke Salvia about 4 months after initial visit, verbalized understanding

## 2019-06-21 DIAGNOSIS — I10 Essential (primary) hypertension: Secondary | ICD-10-CM | POA: Diagnosis not present

## 2019-06-22 ENCOUNTER — Other Ambulatory Visit: Payer: Self-pay | Admitting: Pain Medicine

## 2019-06-26 ENCOUNTER — Telehealth: Payer: Self-pay | Admitting: *Deleted

## 2019-06-26 ENCOUNTER — Ambulatory Visit (INDEPENDENT_AMBULATORY_CARE_PROVIDER_SITE_OTHER): Payer: 59 | Admitting: Pharmacist Clinician (PhC)/ Clinical Pharmacy Specialist

## 2019-06-26 ENCOUNTER — Other Ambulatory Visit: Payer: Self-pay

## 2019-06-26 DIAGNOSIS — I10 Essential (primary) hypertension: Secondary | ICD-10-CM

## 2019-06-26 MED ORDER — IRBESARTAN 150 MG PO TABS: 150.0000 mg | ORAL_TABLET | Freq: Every day | ORAL | 3 refills | Status: DC

## 2019-06-26 NOTE — Progress Notes (Signed)
06/27/2019 Denise Carter 1970/08/01 [id number]   HPI:  Denise Carter is a 49 y.o. female patient of Dr Duke Salvia, who presents today for advanced hypertension clinic follow up.  Her past medical history is significant only for non-cardiac issues including RA, Sjogren's disease and SLE.   She was seen by Dr. Duke Salvia on April 28 and found to have a blood pressure of 164/110.  At that time she was taking only hctz 25 mg, and Dr. Duke Salvia added valsartan 160 mg daily.  Patient then called to note she developed diarrhea with the valsartan.  She called in and was encouraged to continue taking it until her office visit today.  Previously she had problems with several medications (see below), but much of it sounds to be related to just lack of effective combinations/doses.  In addition to the valsartan, Dr. Duke Salvia set her up to see our Care Guide, enrolled her in PREP exercise program and did blood work to check metanephrines, renin/aldosterone and hypokalemia.  She was given a blood pressure cuff and registeredwith [employer].  And lastly she was set up to get a sleep study.    Today she returns for follow up.  She has continued to take the valsartan, although she notes the diarrhea has not abated since staring medication.  She states her appetite has been poor for the past several months, not sure if related to her stress levels or a medication.  She had her first visit with the Care Guide Amy - to work on stress reduction/decompression and smoking cessation.  She has 7 children and notes that one is often in trouble with the law while a couple of others are often "needing something".  She continues to work for [employer], but has to drive to the [employer] as there were no openings here in [place].   See results of secondary testing below.  She has not heard any information about getting sleep study, so we will need to double check that with sleep coordinator.    Secondary cause testing:  Pheochromocytoma 4/21:Metanephrines WNL  hypoaldosteronism 4/21:Renin low (<0.167), aldosterone WNL, aldost/renin ratio WNL  Sleep apnea            ** not yet scheduled**  Renovascular disease 12/19: ultrasound WNL        Blood Pressure Goal:  130/80  Current Medications: valsartan 160 mg qd, hctz 25 mg   Family Hx: mother died at 73 cervical caner, mgp both with hypertension; father died at 66 due to drug issues; no whole sibblings; 7 chidren oldest 34 - no known issues for any at this time  Social Hx: down to close to 1/2 pack per day; no alcohol; caffeine daily, notes to get caffeine withdrawal when skips  Diet: low appetite due to meds, notes lost appetite after d/c prednisone about 8-9 months ago (d/c from 20 mg to 10 mg the d/c)  Exercise: PREP doesn't have any evening schedules, confilcts with work (5 am-10-12 pm at [employer])  Home BP readings: information from [place] shows her to be remaining consistent in the 145-155/100-110 range  She did note on 4 readings to be 102-123 systolic and commented that she was testing time of day on her meds and took them at 3 am (she starts work at 5 am), as opposed to her usual 10-11 am.    Intolerances: chart noted that she developed HA with amlodipine, however upon questioning she believes that it was lisinopril that caused headaches.  She is willing to re-challenge with amlodipine if we feel it is necessary.    Labs:  06/2018:   Na 142, K 3.6, Glu 109, BUN 11, SCr 0.79  Wt Readings from Last 3 Encounters:  06/26/19 174 lb 12.8 oz (79.3 kg)  05/23/19 182 lb (82.6 kg)  01/29/19 174 lb 8 oz (79.2 kg)   BP Readings from Last 3 Encounters:  06/26/19 (!) 158/114  06/12/19 (!) 212/114  05/23/19 (!) 164/110   Pulse Readings from Last 3 Encounters:  06/26/19 78  06/12/19 66  05/23/19 82    Current Outpatient Medications  Medication Sig Dispense Refill  . atorvastatin (LIPITOR) 20 MG tablet Take 1 tablet (20 mg total) by mouth  daily. 90 tablet 3  . cetirizine (ZYRTEC) 10 MG tablet Take 1 tablet (10 mg total) by mouth daily. 30 tablet 11  . fluticasone (FLONASE) 50 MCG/ACT nasal spray Place 2 sprays into both nostrils daily. 16 g 6  . Golimumab (SIMPONI) 50 MG/0.5ML SOSY Inject into the skin every 8 (eight) weeks.    . hydrochlorothiazide (HYDRODIURIL) 25 MG tablet     . hydroxychloroquine (PLAQUENIL) 200 MG tablet Take 1 tablet by mouth 2 (two) times a day.    . irbesartan (AVAPRO) 150 MG tablet Take 1 tablet (150 mg total) by mouth daily. 30 tablet 3  . pregabalin (LYRICA) 150 MG capsule Take 1 capsule (150 mg total) by mouth 2 (two) times daily. 180 capsule 0   No current facility-administered medications for this visit.    Allergies  Allergen Reactions  . Labetalol     Headaches    Past Medical History:  Diagnosis Date  . Atypical chest pain 05/23/2019  . Essential hypertension 05/23/2019  . Hypertension   . Lupus ([place])   . Shortness of breath 05/23/2019  . Snoring 05/23/2019  . Thyroid disease     Blood pressure (!) 158/114, pulse 78, height 5\' 4"  (1.626 m), weight 174 lb 12.8 oz (79.3 kg).  Essential hypertension Patient with poorly controlled systolic/diastolic combined hypertension.  Because of her concerns with valsartan, will have her stop that, hold off for 24 hours, then start with irbesartan 150 mg.  She should continue with the hctz.  We will see her back in a month for follow up.  At that time we will consider the addition of amlodipine or switching hctz to chlorthalidone.  Had a long discussion about the likely need for 3-4 mediations working in combination to achieve blood pressure goals.  She will need to be consistent with home testing during this time.  She was not able to join the PREP program due to conflicts with her work schedule.  Will follow up with sleep coordinator to make sure she is on list of patients to schedule.     Tommy Medal PharmD CPP [facility]  [facility] [address] [place], [place] [address] [phone]

## 2019-06-26 NOTE — Telephone Encounter (Signed)
Patient is aware and agreeable to Home Sleep Study through [facility]. Patient is scheduled for 08/27/19 at 1 pm to pick up home sleep kit and meet with Respiratory therapist at [facility]. Patient is aware that if this appointment date and time does not work for them they should contact Artis Delay directly at [phone]. Patient is aware that a sleep packet will be sent from [facility] in week. Patient is agreeable to treatment and thankful for call.

## 2019-06-26 NOTE — Patient Instructions (Addendum)
  Check your blood pressure at home daily (if able) and keep record of the readings.  Take your BP meds as follows:  Stop valsartan.   Start the irbesartan 150 mg on Thursday  Continue with all other medication  Bring all of your meds, your BP cuff and your record of home blood pressures to your next appointment.  Exercise as you're able, try to walk approximately 30 minutes per day.  Keep salt intake to a minimum, especially watch canned and prepared boxed foods.  Eat more fresh fruits and vegetables and fewer canned items.  Avoid eating in fast food restaurants.    HOW TO TAKE YOUR BLOOD PRESSURE: . Rest 5 minutes before taking your blood pressure. .  Don't smoke or drink caffeinated beverages for at least 30 minutes before. . Take your blood pressure before (not after) you eat. . Sit comfortably with your back supported and both feet on the floor (don't cross your legs). . Elevate your arm to heart level on a table or a desk. . Use the proper sized cuff. It should fit smoothly and snugly around your bare upper arm. There should be enough room to slip a fingertip under the cuff. The bottom edge of the cuff should be 1 inch above the crease of the elbow. . Ideally, take 3 measurements at one sitting and record the average.

## 2019-06-26 NOTE — Telephone Encounter (Signed)
-----   Message from Patricia Pesa, RN sent at 06/19/2019 11:25 AM EDT ----- Regarding: Schedule in home sleep study please In home sleep study valid from 06/15/19 thru 09/13/19, Auth #[id number].  Messaged Dr Duke Salvia to see if home study is acceptable.  Dr Duke Salvia agreed. ----- Message ----- From: Burnell Blanks, LPN Sent: 07/07/2447   9:36 AM EDT To: Burnell Blanks, LPN, [facility] Sleep Studies  Hello all Patient needs sleep study Thanks [place]

## 2019-06-27 ENCOUNTER — Encounter: Payer: Self-pay | Admitting: Pharmacist Clinician (PhC)/ Clinical Pharmacy Specialist

## 2019-06-27 ENCOUNTER — Other Ambulatory Visit: Payer: 59

## 2019-06-27 ENCOUNTER — Ambulatory Visit (INDEPENDENT_AMBULATORY_CARE_PROVIDER_SITE_OTHER): Payer: 59

## 2019-06-27 DIAGNOSIS — F439 Reaction to severe stress, unspecified: Secondary | ICD-10-CM

## 2019-06-27 DIAGNOSIS — Z0000 Encounter for general adult medical examination without abnormal findings: Secondary | ICD-10-CM

## 2019-06-27 NOTE — Assessment & Plan Note (Addendum)
Patient with poorly controlled systolic/diastolic combined hypertension.  Because of her concerns with valsartan, will have her stop that, hold off for 24 hours, then start with irbesartan 150 mg.  She should continue with the hctz.  We will see her back in a month for follow up.  At that time we will consider the addition of amlodipine or switching hctz to chlorthalidone.  Had a long discussion about the likely need for 3-4 mediations working in combination to achieve blood pressure goals.  She will need to be consistent with home testing during this time.  She was not able to join the PREP program due to conflicts with her work schedule.  Will follow up with sleep coordinator to make sure she is on list of patients to schedule.

## 2019-06-27 NOTE — Progress Notes (Signed)
Appointment Outcome:  Completed Session #: 1  AGREEMENTS SECTION    Overall Goal(s): The patient would like to reduce their overall stress level that is contributing to their cigarette smoking.                                             Agreement/Action Steps:  The patient has agreed to incorporating 30 minutes to 1 hour of personal time a day, including any activity of her choice to reduce stress level. Patient also agreed to set boundaries with family and friends to minimize the amount of responsibility that she has taken on so that she can concentrate on her health. Patient is interested in addressing her SDOH (rental and utility assistance) to minimize stress.   Progress Notes:  The patient has been successful in setting boundaries that has reduced responsibilities taken on from others and stress as a result. Patient has stated that she no longer argues or jump to react to stressors/triggers. The patient has reduced her usage of social media and phone calls that trigger stress response prompting smoking. Patient informed me that her husband has been working toward gainful employment that would help to address their SDOH. The patient was not successful in incorporating 30 minutes to 1 hour of personal time away from home including any activity of her choice. Patient stated that she was unable to do this because she is unaware of what she would like to do. Patient stated that she lock herself in her room away from everyone for personal time but it is not an activity outside of the home.  . Indicators of Success and Accountability:  The ability to maintain the boundaries set and to disregard the negative responses from friends and family regarding these boundaries that she is incorporating.   . Readiness: The patient has been implementing action steps working towards reducing stress that is directly tied to her cigarette smoking by setting boundaries for herself.   . Strengths and Supports:  The patient feels empowered by setting boundaries and taking ownership of her mental and emotional health. Patient has been mindful of triggers that increases stress resulting in cigarette smoking.  . Challenges and Barriers: The patient does not foresee any challenges or barriers to achieving their goal of reducing their stress level.   Coaching Outcomes: I will help to locate free evening exercise classes for the patient. Will follow up with patient regarding the [employer] on rental and utility assistance.  Attempted:  Partial #- The patient was able to set boundaries with family and friends that has helped her to reduce her overall stress levels that has contributed to her smoking. Patient was not able to add 30 minutes to 1 hour of personal time including an activity of her choice outside of the home due to not knowing what she would like to do for a hobby.    Referrals: Patient will be provided suggestions for free exercise classes in the evenings at next session. Care Guide/Health Coach requests that patient speaks to PCP regarding any exercise program before engaging.

## 2019-07-04 ENCOUNTER — Other Ambulatory Visit: Payer: Self-pay | Admitting: Pain Medicine

## 2019-07-04 ENCOUNTER — Ambulatory Visit
Admission: RE | Admit: 2019-07-04 | Discharge: 2019-07-04 | Disposition: A | Discharge status: Home / Self Care | Payer: 59 | Source: Ambulatory Visit | Attending: Pain Medicine | Admitting: Pain Medicine

## 2019-07-04 DIAGNOSIS — M545 Low back pain, unspecified: Secondary | ICD-10-CM

## 2019-07-11 ENCOUNTER — Other Ambulatory Visit: Payer: Self-pay

## 2019-07-11 ENCOUNTER — Ambulatory Visit (INDEPENDENT_AMBULATORY_CARE_PROVIDER_SITE_OTHER): Payer: 59

## 2019-07-11 DIAGNOSIS — F439 Reaction to severe stress, unspecified: Secondary | ICD-10-CM

## 2019-07-11 NOTE — Patient Instructions (Signed)

## 2019-07-11 NOTE — Progress Notes (Signed)
Appointment Outcome:  Completed Session #: 2  AGREEMENTS SECTION   Overall Goal(s): Patient has decided that they want to reduce stress by setting boundaries with family. Patient is interested in lowering blood pressure and reducing the occurrence of GRD. Patient would like to identify community resources that will assist with housing needs.                                             Agreement/Action Steps:  Patient agreed to set aside 30 mins - 1 hr of personal time that include walking to reduce stress. Patient stated that they will reduce their activity on social media, and be mindful of their responses to stressful events. Patient will work with Caroleen to identify community resources for housing assistance.   Progress Notes:  Patient is concerned about their blood pressure and GRD. Patient stated that the new medication that she is on is not working for her and that she would like to lower her blood pressure. Patient has been working to reduce her caffeine intake. Patient discussed dietary behavior and how that contributes to their GRD. Patient was stated that they were informed to lose weight, but is interested in learning from PCP how much is appropriate for her.   Patient has set new action steps to achieve goal. Patient is willing to incorporate physical activity into their routine 2xs per week at a local high school or park. Patient stated that they count housework as physical activity in addition to walking. Patient will rest frequently to allow body to regenerate from burnout.     **Patient has yet to qualify for any housing assistance due to income.  **Patient was interested in PREP program. Only offered during their working hours.    Indicators of Success and Accountability:  Patient has challenged themselves to consider what their success in positively coping with stress will look like for them in the future.  . Readiness: Patient has been diligently working towards setting  boundaries and personal time to reduce the amount of stress they have been experience. Patient is in the maintenance phase of behavior change. . Strengths and Supports: Patient currently has not identified any supports to help them work towards their goals. However, patient has been consistent in implementing action steps to reduce stress. Patient has also exhibited the ability to advocate for themselves. . Challenges and Barriers: Patient was challenged to walk for 30 mins - 1 hr as personal time to clear mind. Patient [place] and do not feel safe. Patient foresee that their schedule, being tired, and not having a safe environment as barriers to incorporating physical activity into their routine.  Coaching Outcomes: Patient may benefit from speaking with pharmacist regarding the new blood pressure medication and what their specific baseline their blood pressure should look like on this medication. Patient would like to understand how much and why weight relates to GRD. Patient was provided information on GRD to assist with information on foods to avoid to reduce acid reflux.  As patient continues to work on reducing stress level, patient will be able to work towards a new goal of smoking cessation.  Patient will continue to work to reduce stress over the next four session. Patient will identify what success in stress reduction will look like for them.   Attempted: Marland Kitchen Fulfilled - Patient has  been able to continue to implement actions that establishes and reinforces boundaries to reduce stress.  . Partial #- Patient has been able to find some time to be alone to decompress, but was not able to incorporate walking into their routine.

## 2019-07-23 ENCOUNTER — Other Ambulatory Visit: Payer: Self-pay

## 2019-07-23 ENCOUNTER — Encounter: Payer: Self-pay | Admitting: Endocrinology

## 2019-07-23 ENCOUNTER — Ambulatory Visit (INDEPENDENT_AMBULATORY_CARE_PROVIDER_SITE_OTHER): Payer: 59 | Admitting: Endocrinology

## 2019-07-23 VITALS — BP 120/62 | HR 93 | Ht 64.0 in | Wt 180.6 lb

## 2019-07-23 DIAGNOSIS — I10 Essential (primary) hypertension: Secondary | ICD-10-CM

## 2019-07-23 DIAGNOSIS — E041 Nontoxic single thyroid nodule: Secondary | ICD-10-CM | POA: Diagnosis not present

## 2019-07-23 LAB — TSH: TSH: 0.97 u[IU]/mL (ref 0.35–4.50)

## 2019-07-23 LAB — T4, FREE: Free T4: 0.75 ng/dL (ref 0.60–1.60)

## 2019-07-23 NOTE — Patient Instructions (Addendum)
Let's recheck the ultrasound.  you will receive a phone call, about a day and time for an appointment. Blood tests are requested for you today.  We'll let you know about the results.  Please come back for a follow-up appointment in 6 months.   

## 2019-07-23 NOTE — Progress Notes (Signed)
Subjective:    Patient ID: Denise Carter, female    DOB: September 28, 1970, 49 y.o.   MRN: [id number]  HPI Pt is ref by Dr Oval Linsey, for hyperaldosteronism.  This was dx'ed 2 mos ago.  She has no h/o kidney problems, or cancer.  Main symptom is muscle cramps.  She takes ARB, but no ACEI.  She has intermitt headache.   Past Medical History:  Diagnosis Date  . Atypical chest pain 05/23/2019  . Essential hypertension 05/23/2019  . Hypertension   . Lupus ([place])   . Shortness of breath 05/23/2019  . Snoring 05/23/2019  . Thyroid disease     Past Surgical History:  Procedure Laterality Date  . ABDOMINAL HYSTERECTOMY      Social History   Socioeconomic History  . Marital status: Married    Spouse name: Not on file  . Number of children: Not on file  . Years of education: Not on file  . Highest education level: Not on file  Occupational History  . Not on file  Tobacco Use  . Smoking status: Current Every Day Smoker    Packs/day: 1.00  . Smokeless tobacco: Never Used  Vaping Use  . Vaping Use: Never used  Substance and Sexual Activity  . Alcohol use: No  . Drug use: No  . Sexual activity: Not on file  Other Topics Concern  . Not on file  Social History Narrative   Right handed      Highest level of edu- 12th grade      7 children      Apartment- 2nd floor   Social Determinants of Health   Financial Resource Strain:   . Difficulty of Paying Living Expenses:   Food Insecurity: Unknown  . Worried About Charity fundraiser in the Last Year: Never true  . Ran Out of Food in the Last Year: Not on file  Transportation Needs: No Transportation Needs  . Lack of Transportation (Medical): No  . Lack of Transportation (Non-Medical): No  Physical Activity: Inactive  . Days of Exercise per Week: 0 days  . Minutes of Exercise per Session: 0 min  Stress: Stress Concern Present  . Feeling of Stress : Very much  Social Connections:   . Frequency of Communication with Friends and  Family:   . Frequency of Social Gatherings with Friends and Family:   . Attends Religious Services:   . Active Member of Clubs or Organizations:   . Attends Archivist Meetings:   Marland Kitchen Marital Status:   Intimate Partner Violence:   . Fear of Current or Ex-Partner:   . Emotionally Abused:   Marland Kitchen Physically Abused:   . Sexually Abused:     Current Outpatient Medications on File Prior to Visit  Medication Sig Dispense Refill  . atorvastatin (LIPITOR) 20 MG tablet Take 1 tablet (20 mg total) by mouth daily. 90 tablet 3  . cetirizine (ZYRTEC) 10 MG tablet Take 1 tablet (10 mg total) by mouth daily. (Patient taking differently: Take 10 mg by mouth as needed. ) 30 tablet 11  . fluticasone (FLONASE) 50 MCG/ACT nasal spray Place 2 sprays into both nostrils daily. (Patient taking differently: Place 2 sprays into both nostrils as needed. ) 16 g 6  . Golimumab (SIMPONI) 50 MG/0.5ML SOSY Inject into the skin every 8 (eight) weeks.    . hydrochlorothiazide (HYDRODIURIL) 25 MG tablet Take 25 mg by mouth daily.     . hydroxychloroquine (PLAQUENIL) 200 MG tablet Take 1  tablet by mouth 2 (two) times a day.    . irbesartan (AVAPRO) 150 MG tablet Take 1 tablet (150 mg total) by mouth daily. 30 tablet 3  . pregabalin (LYRICA) 150 MG capsule Take 1 capsule (150 mg total) by mouth 2 (two) times daily. 180 capsule 0   No current facility-administered medications on file prior to visit.    Allergies  Allergen Reactions  . Labetalol     Headaches    Family History  Problem Relation Age of Onset  . Hypertension Mother   . Hypertension Maternal Grandmother   . Lupus Maternal Grandmother   . Heart failure Maternal Grandmother   . Stroke Maternal Grandmother   . CAD Maternal Grandmother   . Adrenal disorder Neg Hx     BP 120/62   Pulse 93   Ht 5\' 4"  (1.626 m)   Wt 180 lb 9.6 oz (81.9 kg)   SpO2 100%   BMI 31.00 kg/m    Review of Systems He denies n/v, and muscle weakness.  She has urinary  urgency, but no polyuria.       Objective:   Physical Exam VS: see vs page GEN: no distress HEAD: head: no deformity eyes: no periorbital swelling, no proptosis external nose and ears are normal NECK: supple, thyroid is not enlarged CHEST WALL: no deformity LUNGS: clear to auscultation CV: reg rate and rhythm, no murmur.  MUSCULOSKELETAL: muscle bulk and strength are grossly normal.  no obvious joint swelling.  gait is normal and steady EXTEMITIES: no deformity.  no edema PULSES: no carotid bruit NEURO:  cn 2-12 grossly intact.   readily moves all 4's.  sensation is intact to touch on all 4's SKIN:  Normal texture and temperature.  No rash or suspicious lesion is visible.   NODES:  None palpable at the neck PSYCH: alert, well-oriented.  Does not appear anxious nor depressed.  (2019): Stable 1.5 cm TR 3 nodule in the inferior left thyroid lobe. No significant change since the previous examination. This nodule meets criteria for follow-up  plasma metas are normal.  Lab Results  Component Value Date   CREATININE 0.79 07/19/2018   BUN 11 07/19/2018   NA 142 07/19/2018   K 3.6 07/19/2018   CL 102 07/19/2018   CO2 23 07/19/2018    ALDOSTERONE 0.0 - 30.0 ng/dL 3.1   Renin 07/21/2018 - 8.185 ng/mL/hr <0.167Low   ALDOS/RENIN RATIO 0.0 - 30.0 >18.6    I have reviewed outside records, and summarized: Pt was noted to have elevated A/R ratio, and referred here.  This was checked, due to difficult to control HTN.    Lab Results  Component Value Date   TSH 1.390 07/19/2018   T4TOTAL 5.9 11/02/2017        Assessment & Plan:  elev aldo/PRA ratio, in the setting of aldo well within normal limits. The significance of this is controversial.  Thyroid nodule, new to me.  Patient Instructions  Let's recheck the ultrasound.  you will receive a phone call, about a day and time for an appointment. Blood tests are requested for you today.  We'll let you know about the results.   Please come back for a follow-up appointment in 6 months.

## 2019-07-24 ENCOUNTER — Telehealth (INDEPENDENT_AMBULATORY_CARE_PROVIDER_SITE_OTHER): Payer: 59 | Admitting: Internal Medicine

## 2019-07-24 ENCOUNTER — Encounter: Payer: Self-pay | Admitting: Internal Medicine

## 2019-07-24 ENCOUNTER — Telehealth: Payer: Self-pay

## 2019-07-24 DIAGNOSIS — R42 Dizziness and giddiness: Secondary | ICD-10-CM | POA: Diagnosis not present

## 2019-07-24 MED ORDER — MECLIZINE HCL 25 MG PO TABS: 25.0000 mg | ORAL_TABLET | Freq: Four times a day (QID) | ORAL | 0 refills | Status: DC | PRN

## 2019-07-24 MED ORDER — ONDANSETRON HCL 4 MG PO TABS: 4.0000 mg | ORAL_TABLET | Freq: Three times a day (TID) | ORAL | 1 refills | Status: DC | PRN

## 2019-07-24 NOTE — Telephone Encounter (Signed)
Call placed to patient reference PREP referral. Agreeable to an evening class starting on 7/20 615p-730p every T/Th x 12 wks.

## 2019-07-24 NOTE — Progress Notes (Signed)
Virtual Visit via Telephone Note  I connected with Denise Carter, on 07/24/2019 at 8:42 AM by telephone due to the COVID-19 pandemic and verified that I am speaking with the correct person using two identifiers.   Consent: I discussed the limitations, risks, security and privacy concerns of performing an evaluation and management service by telephone and the availability of in person appointments. I also discussed with the patient that there may be a patient responsible charge related to this service. The patient expressed understanding and agreed to proceed.   Location of Patient: Home   Location of Provider: Clinic    Persons participating in Telemedicine visit: Alissa Pharr [facility] Dr. Earlene Plater      History of Present Illness: Patient has a visit for concerns about dizziness. Happening in "spurts" the past 3 weeks. Feels like the room is actually spinning. Has not been able to figure out certain positions that worsen the dizziness. When she has a spell, moving her head worsens it and she is afraid to move it. No weakness or numbness of an arm or leg. Associated nausea but no vomiting. No changes in hearing. No changes in vision. No changes in speech. Has headaches regularly so doesn't know if that's related. No falls with episodes of dizziness.    Past Medical History:  Diagnosis Date  . Atypical chest pain 05/23/2019  . Essential hypertension 05/23/2019  . Hypertension   . Lupus ([facility])   . Shortness of breath 05/23/2019  . Snoring 05/23/2019  . Thyroid disease    Allergies  Allergen Reactions  . Labetalol     Headaches    Current Outpatient Medications on File Prior to Visit  Medication Sig Dispense Refill  . atorvastatin (LIPITOR) 20 MG tablet Take 1 tablet (20 mg total) by mouth daily. 90 tablet 3  . cetirizine (ZYRTEC) 10 MG tablet Take 1 tablet (10 mg total) by mouth daily. (Patient taking differently: Take 10 mg by mouth as needed. ) 30 tablet 11   . fluticasone (FLONASE) 50 MCG/ACT nasal spray Place 2 sprays into both nostrils daily. (Patient taking differently: Place 2 sprays into both nostrils as needed. ) 16 g 6  . Golimumab (SIMPONI) 50 MG/0.5ML SOSY Inject into the skin every 8 (eight) weeks.    . hydrochlorothiazide (HYDRODIURIL) 25 MG tablet Take 25 mg by mouth daily.     . hydroxychloroquine (PLAQUENIL) 200 MG tablet Take 1 tablet by mouth 2 (two) times a day.    . irbesartan (AVAPRO) 150 MG tablet Take 1 tablet (150 mg total) by mouth daily. 30 tablet 3  . KLS ESOMEPRAZOLE MAGNESIUM 20 MG capsule Take 1 capsule by mouth 2 (two) times daily.    . pregabalin (LYRICA) 150 MG capsule Take 1 capsule (150 mg total) by mouth 2 (two) times daily. 180 capsule 0   No current facility-administered medications on file prior to visit.    Observations/Objective: NAD. Speaking clearly.  Work of breathing normal.  Alert and oriented. Mood appropriate.   Assessment and Plan: 1. Dizziness No red flag symptoms on history that would warrant further emergent medical evaluation.Trial of meclizine and vestibular rehab. If not resolving, return for in person examination. Strict precautions for symptoms suggestive of CVA or central lesion discussed and instructed to go to ED for any of those symptoms.  - meclizine (ANTIVERT) 25 MG tablet; Take 1 tablet (25 mg total) by mouth 4 (four) times daily as needed for dizziness.  Dispense: 30 tablet; Refill: 0 -  ondansetron (ZOFRAN) 4 MG tablet; Take 1 tablet (4 mg total) by mouth every 8 (eight) hours as needed for nausea or vomiting.  Dispense: 30 tablet; Refill: 1 - Ambulatory referral to Physical Therapy   Follow Up Instructions: PRN and for routine medical care    I discussed the assessment and treatment plan with the patient. The patient was provided an opportunity to ask questions and all were answered. The patient agreed with the plan and demonstrated an understanding of the instructions.   The  patient was advised to call back or seek an in-person evaluation if the symptoms worsen or if the condition fails to improve as anticipated.     I provided 20 minutes total of non-face-to-face time during this encounter including median intraservice time, reviewing previous notes, investigations, ordering medications, medical decision making, coordinating care and patient verbalized understanding at the end of the visit.    Marcy Siren, D.O. Primary [facility]  07/24/2019, 8:42 AM

## 2019-07-25 ENCOUNTER — Telehealth: Payer: Self-pay

## 2019-07-25 ENCOUNTER — Ambulatory Visit: Payer: 59

## 2019-07-25 DIAGNOSIS — Z0000 Encounter for general adult medical examination without abnormal findings: Secondary | ICD-10-CM

## 2019-07-25 NOTE — Telephone Encounter (Signed)
Patient was called into work today and had to cancel appt. Called patient to reschedule. New appt scheduled for 7/7 at 1:30pm.

## 2019-07-27 ENCOUNTER — Encounter: Payer: Self-pay | Admitting: Endocrinology

## 2019-07-27 ENCOUNTER — Other Ambulatory Visit: Payer: 59

## 2019-07-31 ENCOUNTER — Other Ambulatory Visit: Payer: Self-pay

## 2019-07-31 NOTE — Telephone Encounter (Signed)
Pt My Chart message was forwarded to Dr. Everardo All this morning. He will respond today to her My Chart message as he is able.

## 2019-07-31 NOTE — Telephone Encounter (Signed)
Patient called and has questions about her lab work from 07/23/19.  Is requesting a call back to [phone] for clarification and explanation of results she has seen on mychart

## 2019-08-01 ENCOUNTER — Other Ambulatory Visit: Payer: Self-pay | Admitting: Cardiovascular Disease

## 2019-08-01 ENCOUNTER — Telehealth: Payer: Self-pay

## 2019-08-01 ENCOUNTER — Ambulatory Visit: Payer: 59

## 2019-08-01 NOTE — Telephone Encounter (Signed)
Patient cancelled her appointment today due to not feeling well. She would like a call back to reschedule.

## 2019-08-01 NOTE — Telephone Encounter (Signed)
*  STAT* If patient is at the pharmacy, call can be transferred to refill team.   1. Which medications need to be refilled? (please list name of each medication and dose if known) irbesartan (AVAPRO) 150 MG tablet  2. Which pharmacy/location (including street and city if local pharmacy) is medication to be sent to? [id number] - , [place] - [address]  3. Do they need a 30 day or 90 day supply? 30 day  Patient has an appointment 09/06/2019 with the pharmacist.

## 2019-08-01 NOTE — Telephone Encounter (Signed)
Called patient to reschedule appointment from today. Next appointment will be on 08/08/19 at 9:30am.

## 2019-08-01 NOTE — Progress Notes (Deleted)
08/01/2019 BRADEN DELOACH 10-04-1970 [id number]   HPI:  Denise Carter is a 49 y.o. female patient of Dr Duke Salvia, who presents today for advanced hypertension clinic follow up.  Her past medical history is significant only for non-cardiac issues including RA, Sjogren's disease and SLE.   She was seen by Dr. Duke Salvia on April 28 and found to have a blood pressure of 164/110.  At that time she was taking only hctz 25 mg, and Dr. Duke Salvia added valsartan 160 mg daily.  Patient then called to note she developed diarrhea with the valsartan.  She called in and was encouraged to continue taking it until her office visit today.  Previously she had problems with several medications (see below), but much of it sounds to be related to just lack of effective combinations/doses.  In addition to the valsartan, Dr. Duke Salvia set her up to see our Care Guide, enrolled her in PREP exercise program and did blood work to check metanephrines, renin/aldosterone and hypokalemia.  She was given a blood pressure cuff and registeredwith Vivify.  And lastly she was set up to get a sleep study.    Today she returns for follow up.  She has continued to take the valsartan, although she notes the diarrhea has not abated since staring medication.  She states her appetite has been poor for the past several months, not sure if related to her stress levels or a medication.  She had her first visit with the Care Guide Amy - to work on stress reduction/decompression and smoking cessation.  She has 7 children and notes that one is often in trouble with the law while a couple of others are often "needing something".  She continues to work for [employer], but has to drive to the [employer] as there were no openings here in [place].   See results of secondary testing below.  She has not heard any information about getting sleep study, so we will need to double check that with sleep coordinator.    Secondary cause  testing: Pheochromocytoma 4/21:Metanephrines WNL  hypoaldosteronism 4/21:Renin low (<0.167), aldosterone WNL, aldost/renin ratio WNL  Sleep apnea            ** not yet scheduled**  Renovascular disease 12/19: ultrasound WNL        Blood Pressure Goal:  130/80  Current Medications: valsartan 160 mg qd, hctz 25 mg   Family Hx: mother died at 55 cervical caner, mgp both with hypertension; father died at 34 due to drug issues; no whole sibblings; 7 chidren oldest 47 - no known issues for any at this time  Social Hx: down to close to 1/2 pack per day; no alcohol; caffeine daily, notes to get caffeine withdrawal when skips  Diet: low appetite due to meds, notes lost appetite after d/c prednisone about 8-9 months ago (d/c from 20 mg to 10 mg the d/c)  Exercise: PREP doesn't have any evening schedules, confilcts with work (5 am-10-12 pm at [employer])  Home BP readings: information from [place] shows her to be remaining consistent in the 145-155/100-110 range  She did note on 4 readings to be 102-123 systolic and commented that she was testing time of day on her meds and took them at 3 am (she starts work at 5 am), as opposed to her usual 10-11 am.    Intolerances: chart noted that she developed HA with amlodipine, however upon questioning she believes that it was lisinopril that caused headaches.  She is willing to re-challenge with amlodipine if we feel it is necessary.    Labs:  06/2018:   Na 142, K 3.6, Glu 109, BUN 11, SCr 0.79  Wt Readings from Last 3 Encounters:  07/23/19 180 lb 9.6 oz (81.9 kg)  06/26/19 174 lb 12.8 oz (79.3 kg)  05/23/19 182 lb (82.6 kg)   BP Readings from Last 3 Encounters:  07/23/19 120/62  06/26/19 (!) 158/114  06/12/19 (!) 212/114   Pulse Readings from Last 3 Encounters:  07/23/19 93  06/26/19 78  06/12/19 66    Current Outpatient Medications  Medication Sig Dispense Refill  . atorvastatin (LIPITOR) 20 MG tablet Take 1 tablet (20 mg total) by  mouth daily. 90 tablet 3  . cetirizine (ZYRTEC) 10 MG tablet Take 1 tablet (10 mg total) by mouth daily. (Patient taking differently: Take 10 mg by mouth as needed. ) 30 tablet 11  . fluticasone (FLONASE) 50 MCG/ACT nasal spray Place 2 sprays into both nostrils daily. (Patient taking differently: Place 2 sprays into both nostrils as needed. ) 16 g 6  . Golimumab (SIMPONI) 50 MG/0.5ML SOSY Inject into the skin every 8 (eight) weeks.    . hydrochlorothiazide (HYDRODIURIL) 25 MG tablet Take 25 mg by mouth daily.     . hydroxychloroquine (PLAQUENIL) 200 MG tablet Take 1 tablet by mouth 2 (two) times a day.    . irbesartan (AVAPRO) 150 MG tablet Take 1 tablet (150 mg total) by mouth daily. 30 tablet 3  . KLS ESOMEPRAZOLE MAGNESIUM 20 MG capsule Take 1 capsule by mouth 2 (two) times daily.    . meclizine (ANTIVERT) 25 MG tablet Take 1 tablet (25 mg total) by mouth 4 (four) times daily as needed for dizziness. 30 tablet 0  . ondansetron (ZOFRAN) 4 MG tablet Take 1 tablet (4 mg total) by mouth every 8 (eight) hours as needed for nausea or vomiting. 30 tablet 1  . pregabalin (LYRICA) 150 MG capsule Take 1 capsule (150 mg total) by mouth 2 (two) times daily. 180 capsule 0   No current facility-administered medications for this visit.    Allergies  Allergen Reactions  . Labetalol     Headaches    Past Medical History:  Diagnosis Date  . Atypical chest pain 05/23/2019  . Essential hypertension 05/23/2019  . Hypertension   . Lupus (HCC)   . Shortness of breath 05/23/2019  . Snoring 05/23/2019  . Thyroid disease     There were no vitals taken for this visit.  No problem-specific Assessment & Plan notes found for this encounter.   Phillips Hay PharmD CPP [facility] [address] [place], [place] [address] [phone]

## 2019-08-02 LAB — METANEPHRINES, PLASMA
Metanephrine, Free: 56 pg/mL (ref <=57)
Normetanephrine, Free: 84 pg/mL (ref <=148)
Total Metanephrines-Plasma: 140 pg/mL (ref <=205)

## 2019-08-02 LAB — CATECHOLAMINES, FRACTIONATED, PLASMA
Dopamine: <10 pg/mL
Epinephrine: <20 pg/mL
Norepinephrine: 319 pg/mL
Total Catecholamines: 319 pg/mL

## 2019-08-02 LAB — ALDOSTERONE + RENIN ACTIVITY W/ RATIO: Aldosterone: 1 ng/dL

## 2019-08-02 LAB — EXTRA SPECIMEN

## 2019-08-02 NOTE — Telephone Encounter (Signed)
Patient calling again to follow up on her call from the other day - states she does not understand lab results and requests nurse / dr to call her to explain. [phone]

## 2019-08-02 NOTE — Telephone Encounter (Signed)
Sent patient a mychart message to inform her of her recent refill request.

## 2019-08-03 ENCOUNTER — Ambulatory Visit
Admission: RE | Admit: 2019-08-03 | Discharge: 2019-08-03 | Disposition: A | Discharge status: Home / Self Care | Payer: 59 | Source: Ambulatory Visit | Attending: Endocrinology | Admitting: Endocrinology

## 2019-08-03 ENCOUNTER — Other Ambulatory Visit: Payer: Self-pay

## 2019-08-03 ENCOUNTER — Encounter: Payer: Self-pay | Admitting: Endocrinology

## 2019-08-03 ENCOUNTER — Other Ambulatory Visit: Payer: Self-pay | Admitting: Endocrinology

## 2019-08-03 DIAGNOSIS — I10 Essential (primary) hypertension: Secondary | ICD-10-CM

## 2019-08-03 DIAGNOSIS — E041 Nontoxic single thyroid nodule: Secondary | ICD-10-CM

## 2019-08-03 NOTE — Telephone Encounter (Signed)
Returned pt call and advised pt to remain patient while we await ALL test results. Reassured pt that she WILL receive a phone call about her labs AND about the treatment plan Dr. Everardo All recommends. Verbalized acceptance and understanding.

## 2019-08-03 NOTE — Telephone Encounter (Signed)
Please advise. I am not certain what I am to to tell the patient since your note indicates her labs were normal and to continue same medications. Does she require an appt to further discuss?

## 2019-08-08 ENCOUNTER — Other Ambulatory Visit: Payer: Self-pay

## 2019-08-08 ENCOUNTER — Telehealth: Payer: Self-pay

## 2019-08-08 ENCOUNTER — Ambulatory Visit (INDEPENDENT_AMBULATORY_CARE_PROVIDER_SITE_OTHER): Payer: 59

## 2019-08-08 DIAGNOSIS — F439 Reaction to severe stress, unspecified: Secondary | ICD-10-CM

## 2019-08-08 DIAGNOSIS — Z0000 Encounter for general adult medical examination without abnormal findings: Secondary | ICD-10-CM

## 2019-08-08 NOTE — Progress Notes (Signed)
Appointment Outcome:  Completed Session #: 3  AGREEMENTS SECTION   Overall Goal(s): Patient wants to reduce overall stress to help manage HBP; improve diet to manage GERD symptoms and HBP; and lose weight (amount unspecified at this time).                                        Agreement/Action Steps:  Patient previously stated they will cut down on caffeine intake, follow a diet for GERD and HBP, enforce boundaries with family members, talk to pharmacist/doctor about HBP meds and weight, walk at [facility] 2x week, and clean on Sunday mornings.  Progress Notes:  Patient has had difficulties in enforcing boundaries due to taking on extra familial responsibilities. However the patient continues to strive to place parameters on what she can and cannot do so that she can focus on her health. Patient continues to face financial concerns that is contributing to her high stress. Patient has picked up more work hours, which is causing her to be tired and less time to invest in enforcing action steps towards achieving her goals.   Patient stated they do not eat breakfast due to the time that they work and the timing of their breaks/lunch.   Patient has not be keeping up with her BP readings via Vivify. Patient still suffers from GERD depending on the foods that she consumes.  Patient has not set a SMART goal for weight. Patient states this is difficult because her weight fluctuates and she doesn't know a reasonable amount to lose and has expressed that she would like to speak with her PCP about how much she should weigh.  Patient also expressed dissatisfaction with a previous healthcare experience that has left her concerned about the results of her health outcomes.   Indicators of Success and Accountability:  Patient will be able to manage stress while successfully enforcing boundaries by not taking on extra responsibilities so that she can focus on improving her health.  . Readiness: Patient is  ready to make changes as identified in her action steps in spite of the setbacks that she has experienced. . Strengths and Supports: Patient is currently relying on her previous success with implementing boundaries to help guide her in reinforcing those boundaries. Patient currently is experiencing a challenge with a support system and is relying on the Health Coach as an accountability partner and  . Challenges and Barriers: Patient continue to face challenges with having GERD, limited time, being tired, and physical pain as barriers to implementing action steps to meet their goals. Patient is expressing mental and emotional exhaustion with implementing familial boundaries. Patient is not consuming breakfast due to their work hours and timing of breaks at work.  Coaching Outcomes: Patient will try the heart healthy recipes such as the Dash diet and the Mediterranean diet. Patient will start back checking her BP daily tracking with Vivify. Patient will resume taking BP medication as scheduled. Patient will work on securing a positive support system to aid her in making the self-identified healthy lifestyle changes.   Patient is scheduled to talk to her pharmacist about her BP medications on 09/06/19.  Patient has co-created a schedule for them to try to maintain healthy eating habits and routine. See example:  Breakfast: 9-9:30am Snack: 11:30am-12:00pm Lunch: 2pm (Check BP and take BP meds) Snack: 4pm (Recheck BP) Dinner: 6pm     Attempted: Partial -  Patient continues to strive to enforce boundaries to reduce stress. However, she is no longer in maintenance phase of behavior change due to a relapse in taking on familial responsibilities.  Not met - Patient has not been able to engage in physical activity outside of work by walking 2x week at the [facility] due to time, being tired, and weather. Patient is starting to work on healthier eating habits but have not begun a solid routine yet.  Patient has not been taking their BP meds or checking her BP on a regular basis with Vivify.

## 2019-08-08 NOTE — Telephone Encounter (Signed)
Pt was scheduled for PREP intake yesterday at 4pm. Attempted to reach pt several times without success. Unable to leave messages. Reattempted this am without success.  Will keep pt on referral list for future classes.

## 2019-08-09 LAB — ALDOSTERONE + RENIN ACTIVITY W/ RATIO
ALDO / PRA Ratio: 75 Ratio — ABNORMAL HIGH (ref 0.9–28.9)
Aldosterone: 3 ng/dL
Renin Activity: 0.04 ng/mL/h — ABNORMAL LOW (ref 0.25–5.82)

## 2019-08-12 ENCOUNTER — Inpatient Hospital Stay: Admission: RE | Admit: 2019-08-12 | Payer: 59 | Source: Ambulatory Visit

## 2019-08-13 ENCOUNTER — Encounter: Payer: Self-pay | Admitting: Endocrinology

## 2019-08-22 ENCOUNTER — Other Ambulatory Visit: Payer: Self-pay

## 2019-08-22 ENCOUNTER — Ambulatory Visit (INDEPENDENT_AMBULATORY_CARE_PROVIDER_SITE_OTHER): Payer: 59

## 2019-08-22 ENCOUNTER — Telehealth: Payer: Self-pay | Admitting: Pharmacist Clinician (PhC)/ Clinical Pharmacy Specialist

## 2019-08-22 DIAGNOSIS — Z0000 Encounter for general adult medical examination without abnormal findings: Secondary | ICD-10-CM

## 2019-08-22 DIAGNOSIS — G6289 Other specified polyneuropathies: Secondary | ICD-10-CM

## 2019-08-22 DIAGNOSIS — F439 Reaction to severe stress, unspecified: Secondary | ICD-10-CM

## 2019-08-22 MED ORDER — IRBESARTAN 300 MG PO TABS: 300.0000 mg | ORAL_TABLET | Freq: Every day | ORAL | 3 refills | Status: DC

## 2019-08-22 NOTE — Telephone Encounter (Signed)
Patient still having elevated diastolic BP readings (110-125) on Vivify.  Systolic readings 140-160.    Will have patient increase irbesartan to 300 mg daily and repeat BMET when in office 8-12 for follow up.

## 2019-08-22 NOTE — Progress Notes (Signed)
Appointment Outcome:  Completed Session #: 4  AGREEMENTS SECTION   Overall Goal(s): Control HBP Stress management      Lose weight                                          Understand results of blood test completed by endocrinologist   Agreement/Action Steps:  Patient will check BP after taking meds and eating in the morning. Patient will establish an eating routine that will include a lunch break and mid-morning snack. Patient will follow a GERD related diet and reduce caffeine consumption. Patient will engage in physical activity 2x per week (walking at high school), personal hobby, and/or relaxation techniques daily to reduce stress. Patient will also establish and enforce boundaries with family to reduce stress/responsibilities.  Progress Notes:  Patient has been under extreme stress and experiencing body pain that has caused her to be lethargic. Patient stated that stress has interfered with her eating a full meal. Patient has been able to cut down on caffeine consumption and has attempted to follow a GERD related diet.   Patient has been tracking her BP with Vivify, but the readings are not registering.   Patient stated that she feels that she is not receiving the proper medical care and she is not getting straight forward answers from providers. She believes this is contributing to her stress.  Patient is concerned about her wellbeing and is incorporating rest to manage stress. However, patient has not been able to find personal time away from family to engage in exercise, a hobby, a relaxation techniques (meditation and deep breathing) or enforce boundaries to aid in the reduction of stress.   Patient is more concerned about health and wellbeing at this time.   Indicators of Success and Accountability:  What are the mile markers along your path to reaching your desired changes  . Readiness: Patient is in maintenance phase of managing HBP. Patient is in action phase of managing  stress. Patient is in maintenance stage of seeking care and engaging in treatment. . Strengths and Supports: Patient currently does not have a stable support system to provide encouragement or accountability for reaching/maintaining goals. . Challenges and Barriers: Patient's identified barriers are stress, time, and pain.  Coaching Outcomes: Care Guide/Health Coach will follow up with Dr. Duke Salvia and Pharmacy regarding Vivify readings not registering for patient's device. Will contact Dr. Duke Salvia regarding questions related to patient's concern on blood work for follow-up. Patient will send a message to PCP regarding Lyrica prescription for neuropathy in feet.  Patient will continue to manage stress with incorporating as much rest as possible to also help manage pain. Patient will carve out some alone time to decompress with an activity of her choice to aid in stress reduction.  Patient will continue to monitor HBP via Vivify and take medications as prescribed. Care Guide will follow up with patient on steps to reconnect cuff to network.  Attempted: Marland Kitchen Fulfilled #- HBP: Patient is monitoring BP via Vivify. Patient is following medication regimen as prescribed. . Blood work: Patient is being proactive in advocating for her needs regarding her health status/outcomes by engaging providers through messaging and actively asking questions to understand diagnoses and next steps for treatment. . Partial #- Stress management: Patient is attempting to manage stress with proper rest. However, patient is not able to enforce action steps to minimize stress further due  to financial hardship and familial responsibilities.

## 2019-08-23 ENCOUNTER — Telehealth: Payer: Self-pay

## 2019-08-23 ENCOUNTER — Other Ambulatory Visit: Payer: Self-pay | Admitting: Internal Medicine

## 2019-08-23 MED ORDER — PREGABALIN 150 MG PO CAPS: 150.0000 mg | ORAL_CAPSULE | Freq: Two times a day (BID) | ORAL | 0 refills | Status: DC

## 2019-08-23 NOTE — Telephone Encounter (Signed)
Called patient to follow up regarding outcomes of Vivify, endo referral, and change in BP medication. Patient wants a second-opinion/bloodwork redone by another provider. Patient states that BP is still high after checking it with Vivify twice today. Patient has been advised to continue taking medication as prescribed.  Patient discussed cancelling next appt the day before seeing the pharmacy and come on the same day as that appt. Care Guide will not be available at the request time. Patient's next appt is 8/25 with Care Guide.

## 2019-08-24 ENCOUNTER — Telehealth: Payer: Self-pay | Admitting: *Deleted

## 2019-08-24 NOTE — Telephone Encounter (Signed)
Spoke with patient and she does not feel like she is getting answers from Dr Everardo All She would like referral preferably to another office  Will forward to Dr Duke Salvia to see who she would recommend

## 2019-08-24 NOTE — Telephone Encounter (Signed)
-----   Message from Renaee Munda sent at 08/23/2019  2:18 PM EDT ----- Regarding: Endo Referral Hi Kennah Hehr,  This is the patient that we were discussing early. She is interested in a second-opinion. Will Dr. Duke Salvia put in a referral for her to see a different Endocrinologist?

## 2019-08-27 ENCOUNTER — Other Ambulatory Visit: Payer: Self-pay

## 2019-08-27 ENCOUNTER — Ambulatory Visit (HOSPITAL_BASED_OUTPATIENT_CLINIC_OR_DEPARTMENT_OTHER): Payer: 59 | Attending: Cardiovascular Disease | Admitting: Cardiovascular Disease

## 2019-08-27 DIAGNOSIS — R0683 Snoring: Secondary | ICD-10-CM

## 2019-08-27 DIAGNOSIS — G4733 Obstructive sleep apnea (adult) (pediatric): Secondary | ICD-10-CM | POA: Diagnosis present

## 2019-08-27 DIAGNOSIS — R400 Somnolence: Secondary | ICD-10-CM

## 2019-08-27 DIAGNOSIS — R0902 Hypoxemia: Secondary | ICD-10-CM | POA: Insufficient documentation

## 2019-08-27 NOTE — Telephone Encounter (Signed)
Maybe Dr. Marylouise Stacks.

## 2019-08-29 ENCOUNTER — Telehealth: Payer: Self-pay | Admitting: *Deleted

## 2019-08-29 NOTE — Telephone Encounter (Signed)
-----   Message from Quintella Reichert, MD sent at 08/29/2019  2:48 PM EDT ----- Please let patient know that she has mild OSA - set up OV virtual to discuss treatment

## 2019-08-29 NOTE — Procedures (Signed)
    Patient Name: Denise Carter, [place] Date: 08/27/2019 Gender: Female D.O.B: [birth year] Age (years): 49 Referring Provider: Chilton Si Height (inches): 64 Interpreting Physician: Armanda Magic MD, ABSM Weight (lbs): 176 RPSGT: Vill[place]a Sink BMI: 30 MRN: [id number] Neck Size: 14.00  CLINICAL INFORMATION Sleep Study Type: HST  Indication for sleep study: N/A  Epworth Sleepiness Score: 4  SLEEP STUDY TECHNIQUE A multi-channel overnight portable sleep study was performed. The channels recorded were: nasal airflow, thoracic respiratory movement, and oxygen saturation with a pulse oximetry. Snoring was also monitored.  MEDICATIONS Patient self administered medications include: N/A.  SLEEP ARCHITECTURE Patient was studied for 377.3 minutes. The sleep efficiency was 100.0 % and the patient was supine for 64.2%. The arousal index was 0.0 per hour.  RESPIRATORY PARAMETERS The overall AHI was 7.3 per hour, with a central apnea index of 0.0 per hour.  The oxygen nadir was 80% during sleep.  CARDIAC DATA Mean heart rate during sleep was 79.8 bpm.  IMPRESSIONS - Mild obstructive sleep apnea occurred during this study (AHI = 7.3/h). - No significant central sleep apnea occurred during this study (CAI = 0.0/h). - Severe oxygen desaturation was noted during this study (Min O2 = 80%). - Patient snored 33.8% during the sleep.  DIAGNOSIS - Obstructive Sleep Apnea (G47.33) - Nocturnal Hypoxemia (G47.36)  RECOMMENDATIONS - Therapeutic CPAP titration to determine optimal pressure required to alleviate sleep disordered breathing. - Positional therapy avoiding supine position during sleep. - Avoid alcohol, sedatives and other CNS depressants that may worsen sleep apnea and disrupt normal sleep architecture. - Sleep hygiene should be reviewed to assess factors that may improve sleep quality. - Weight management and regular exercise should be initiated or continued. - Return  to sleep lab to discuss results.   [Electronically signed] 08/29/2019 02:44 PM  Armanda Magic MD, ABSM Diplomate, American Board of Sleep Medicine

## 2019-08-29 NOTE — Telephone Encounter (Signed)
Called results lmtcb. 

## 2019-08-30 NOTE — Telephone Encounter (Signed)
Patient is returning call.  °

## 2019-08-31 ENCOUNTER — Telehealth: Payer: Self-pay | Admitting: Endocrinology

## 2019-08-31 NOTE — Telephone Encounter (Signed)
Advised patient, verbalized understanding  

## 2019-08-31 NOTE — Telephone Encounter (Signed)
OK 

## 2019-08-31 NOTE — Telephone Encounter (Signed)
Please review pt request and advise 

## 2019-08-31 NOTE — Telephone Encounter (Signed)
Please refer to pt request to switch to you.

## 2019-08-31 NOTE — Telephone Encounter (Signed)
Dr. Everardo All has responded and approved transfer. I have also forwarded this message to Dr. Elvera Lennox as well. Will need to await her response upon her return.

## 2019-08-31 NOTE — Telephone Encounter (Signed)
Patient called asking if she could switch Dr's from [place] to [place], she said she prefers a female Dr and specifically asked for Dr Reece Agar. Please advise.

## 2019-09-04 ENCOUNTER — Encounter: Payer: Self-pay | Admitting: *Deleted

## 2019-09-04 NOTE — Telephone Encounter (Signed)
Informed patient of sleep study results and patient understanding was verbalized. Patient understands her sleep study showed she has mild OSA -and set up OV virtual to discuss treatment. Patient has a virtual appt. Scheduled for 09/20/19 at 9:20. Pt is aware and agreeable to her results.

## 2019-09-04 NOTE — Telephone Encounter (Signed)
  Patient Consent for Virtual Visit         Denise Carter has provided verbal consent on 09/04/2019 for a virtual visit (video or telephone).   CONSENT FOR VIRTUAL VISIT FOR:  Denise Carter  By participating in this virtual visit I agree to the following:  I hereby voluntarily request, consent and authorize [facility] and its employed or contracted physicians, physician assistants, nurse practitioners or other licensed health care professionals (the Practitioner), to provide me with telemedicine health care services (the "Services") as deemed necessary by the treating Practitioner. I acknowledge and consent to receive the Services by the Practitioner via telemedicine. I understand that the telemedicine visit will involve communicating with the Practitioner through live audiovisual communication technology and the disclosure of certain medical information by electronic transmission. I acknowledge that I have been given the opportunity to request an in-person assessment or other available alternative prior to the telemedicine visit and am voluntarily participating in the telemedicine visit.  I understand that I have the right to withhold or withdraw my consent to the use of telemedicine in the course of my care at any time, without affecting my right to future care or treatment, and that the Practitioner or I may terminate the telemedicine visit at any time. I understand that I have the right to inspect all information obtained and/or recorded in the course of the telemedicine visit and may receive copies of available information for a reasonable fee.  I understand that some of the potential risks of receiving the Services via telemedicine include:  Marland Kitchen Delay or interruption in medical evaluation due to technological equipment failure or disruption; . Information transmitted may not be sufficient (e.g. poor resolution of images) to allow for appropriate medical decision making by the  Practitioner; and/or  . In rare instances, security protocols could fail, causing a breach of personal health information.  Furthermore, I acknowledge that it is my responsibility to provide information about my medical history, conditions and care that is complete and accurate to the best of my ability. I acknowledge that Practitioner's advice, recommendations, and/or decision may be based on factors not within their control, such as incomplete or inaccurate data provided by me or distortions of diagnostic images or specimens that may result from electronic transmissions. I understand that the practice of medicine is not an exact science and that Practitioner makes no warranties or guarantees regarding treatment outcomes. I acknowledge that a copy of this consent can be made available to me via my patient portal [facility] MyChart), or I can request a printed copy by calling the office of [facility].    I understand that my insurance will be billed for this visit.   I have read or had this consent read to me. . I understand the contents of this consent, which adequately explains the benefits and risks of the Services being provided via telemedicine.  . I have been provided ample opportunity to ask questions regarding this consent and the Services and have had my questions answered to my satisfaction. . I give my informed consent for the services to be provided through the use of telemedicine in my medical care

## 2019-09-05 ENCOUNTER — Ambulatory Visit: Payer: Self-pay

## 2019-09-06 ENCOUNTER — Ambulatory Visit (INDEPENDENT_AMBULATORY_CARE_PROVIDER_SITE_OTHER): Payer: 59 | Admitting: Pharmacist

## 2019-09-06 ENCOUNTER — Other Ambulatory Visit: Payer: Self-pay

## 2019-09-06 VITALS — BP 164/120 | HR 87 | Resp 15 | Ht 64.0 in | Wt 176.2 lb

## 2019-09-06 DIAGNOSIS — I10 Essential (primary) hypertension: Secondary | ICD-10-CM

## 2019-09-06 MED ORDER — AMLODIPINE BESYLATE 5 MG PO TABS: 5.0000 mg | ORAL_TABLET | Freq: Every day | ORAL | 1 refills | Status: DC

## 2019-09-06 NOTE — Progress Notes (Signed)
HPI:  Denise Carter is a 49 y.o. female patient of Dr Duke Salvia, who presents today for advanced hypertension clinic follow up.  Her past medical history is significant only for non-cardiac issues including RA, Sjogren's disease and SLE.  Previously she had problems with several medications (see below), but much of it sounds to be related to just lack of effective combinations/doses. Dr. Duke Salvia set her up to see our Care Guide, enrolled her in [facility] exercise program and did blood work to check metanephrines, renin/aldosterone and hypokalemia.  She was given a blood pressure cuff and registered with [facility]. Patient also completed her sleep study and has an appointment with Dr Mayford Knife to discuss therapy options.   Valsartan was changed to irbesartan during last OV,  then increased to 300mg  daily on 08/22/2019 after review of [facility] report. Today she returns for follow up.   Secondary cause testing: Pheochromocytoma 4/21:Metanephrines WNL  hypoaldosteronism 4/21:Renin low (<0.167), aldosterone WNL, aldost/renin ratio WNL  Sleep apnea 8/21: OSA , CPAP titration recommended  Renovascular disease 12/19: ultrasound WNL        Blood Pressure Goal:  130/80  Current Medications: Irbesartan 300mg  daily  Family Hx: mother died at 46 cervical caner, mgp both with hypertension; father died at 8 due to drug issues; no whole sibblings; 7 chidren oldest 77 - no known issues for any at this time  Social Hx: down to close to 1/2 pack per day; no alcohol; caffeine daily (multiple cups of coffee and Coke throughout the day)   diet: low appetite due to meds, notes lost appetite after d/c prednisone about 8-9 months ago (d/c from 20 mg to 10 mg the d/c)  Exercise: [facility] doesn't have any evening schedules, confilcts with work (5 am-10-12 pm at [employer])  Home BP readings: 20 readings in [facility] from 7/29 to 8/12; average 154/110; HR range 79-112  Intolerances: Labetalol - HA HCTZ -  HA Valsartan - diarrhea   Wt Readings from Last 3 Encounters:  09/06/19 176 lb 3.2 oz (79.9 kg)  08/27/19 176 lb (79.8 kg)  07/23/19 180 lb 9.6 oz (81.9 kg)   BP Readings from Last 3 Encounters:  09/06/19 (!) 164/120  07/23/19 120/62  06/26/19 (!) 158/114   Pulse Readings from Last 3 Encounters:  09/06/19 87  07/23/19 93  06/26/19 78    Current Outpatient Medications  Medication Sig Dispense Refill  . cetirizine (ZYRTEC) 10 MG tablet Take 1 tablet (10 mg total) by mouth daily. (Patient taking differently: Take 10 mg by mouth as needed. ) 30 tablet 11  . fluticasone (FLONASE) 50 MCG/ACT nasal spray Place 2 sprays into both nostrils daily. (Patient taking differently: Place 2 sprays into both nostrils as needed. ) 16 g 6  . Golimumab (SIMPONI) 50 MG/0.5ML SOSY Inject into the skin every 8 (eight) weeks.    . hydroxychloroquine (PLAQUENIL) 200 MG tablet Take 1 tablet by mouth 2 (two) times a day.    . irbesartan (AVAPRO) 300 MG tablet Take 1 tablet (300 mg total) by mouth daily. 90 tablet 3  . meclizine (ANTIVERT) 25 MG tablet Take 1 tablet (25 mg total) by mouth 4 (four) times daily as needed for dizziness. 30 tablet 0  . ondansetron (ZOFRAN) 4 MG tablet Take 1 tablet (4 mg total) by mouth every 8 (eight) hours as needed for nausea or vomiting. 30 tablet 1  . pregabalin (LYRICA) 150 MG capsule Take 1 capsule (150 mg total) by mouth 2 (two)  times daily. 180 capsule 0  . amLODipine (NORVASC) 5 MG tablet Take 1 tablet (5 mg total) by mouth daily. 90 tablet 1   No current facility-administered medications for this visit.    Allergies  Allergen Reactions  . Labetalol     Headaches    Past Medical History:  Diagnosis Date  . Atypical chest pain 05/23/2019  . Essential hypertension 05/23/2019  . Hypertension   . Lupus (HCC)   . Shortness of breath 05/23/2019  . Snoring 05/23/2019  . Thyroid disease     Blood pressure (!) 164/120, pulse 87, resp. rate 15, height 5\' 4"  (1.626 m),  weight 176 lb 3.2 oz (79.9 kg), SpO2 99 %.  HTN (hypertension) Blood pressure remains significantly above goal while Irbesartan 300mg  daily. Patient denies issue with current therapy, but reports chronic issues with headaches and pain on her feet. Noted she still smoking and ingesting a very high amount of caffeine every day.  She is also concern about possibility of diabetes d/t strong family history of diabetes and pain on her feet. She reports using amlodipine in the past when living in and can not recall any issues with it. Can not tolerate labetalol, valsartan or HCTZ.    Will repeat BMET today, start amlodipine at 5mg  daily, and schedule follow up in 4 weeks. Plan to add spironolactone to therapy during next office visit (if needed).   Rasean Joos Rodriguez-Guzman PharmD, BCPS, CPP [facility] [address] [place] [facility] 09/07/2019 11:46 AM

## 2019-09-06 NOTE — Patient Instructions (Addendum)
Return for a follow up appointment in 4 weeks  Go to the lab in TODAY  Check your blood pressure at home daily (if able) and keep record of the readings.  Take your BP meds as follows: *CONTINUE taking irbesartan 300mg  daily* *START taking amlodipine 5mg  daily (at bedtime)*  *Blood pressure clinic phone number: [phone]  Bring all of your meds, your BP cuff and your record of home blood pressures to your next appointment.  Exercise as you're able, try to walk approximately 30 minutes per day.  Keep salt intake to a minimum, especially watch canned and prepared boxed foods.  Eat more fresh fruits and vegetables and fewer canned items.  Avoid eating in fast food restaurants.    HOW TO TAKE YOUR BLOOD PRESSURE: . Rest 5 minutes before taking your blood pressure. .  Don't smoke or drink caffeinated beverages for at least 30 minutes before. . Take your blood pressure before (not after) you eat. . Sit comfortably with your back supported and both feet on the floor (don't cross your legs). . Elevate your arm to heart level on a table or a desk. . Use the proper sized cuff. It should fit smoothly and snugly around your bare upper arm. There should be enough room to slip a fingertip under the cuff. The bottom edge of the cuff should be 1 inch above the crease of the elbow. . Ideally, take 3 measurements at one sitting and record the average.

## 2019-09-07 ENCOUNTER — Encounter: Payer: Self-pay | Admitting: Pharmacist

## 2019-09-07 LAB — BASIC METABOLIC PANEL
BUN/Creatinine Ratio: 11 (ref 9–23)
BUN: 8 mg/dL (ref 6–24)
CO2: 28 mmol/L (ref 20–29)
Calcium: 9.3 mg/dL (ref 8.7–10.2)
Chloride: 102 mmol/L (ref 96–106)
Creatinine, Ser: 0.76 mg/dL (ref 0.57–1.00)
GFR calc Af Amer: 106 mL/min/{1.73_m2} (ref >59)
GFR calc non Af Amer: 92 mL/min/{1.73_m2} (ref >59)
Glucose: 69 mg/dL (ref 65–99)
Potassium: 4.4 mmol/L (ref 3.5–5.2)
Sodium: 141 mmol/L (ref 134–144)

## 2019-09-07 LAB — HEMOGLOBIN A1C
Est. average glucose Bld gHb Est-mCnc: 91 mg/dL
Hgb A1c MFr Bld: 4.8 % (ref 4.8–5.6)

## 2019-09-07 NOTE — Assessment & Plan Note (Addendum)
Blood pressure remains significantly above goal while Irbesartan 300mg  daily. Patient denies issue with current therapy, but reports chronic issues with headaches and pain on her feet. Noted she still smoking and ingesting a very high amount of caffeine every day.  She is also concern about possibility of diabetes d/t strong family history of diabetes and pain on her feet. She reports using amlodipine in the past when living in and can not recall any issues with it. Can not tolerate labetalol, valsartan or HCTZ.    Will repeat BMET today, start amlodipine at 5mg  daily, and schedule follow up in 4 weeks. Plan to add spironolactone to therapy during next office visit (if needed).

## 2019-09-10 NOTE — Telephone Encounter (Signed)
I reviewed the chart and she appears to have ruled out for endocrine hypertension.  Her aldosterone, catecholamines, and metanephrines are normal.  I will decline the transfer.

## 2019-09-10 NOTE — Telephone Encounter (Signed)
Thank you :)

## 2019-09-10 NOTE — Telephone Encounter (Signed)
Please refer to Dr. Charlean Sanfilippo response below

## 2019-09-12 NOTE — Telephone Encounter (Signed)
This encounter was created in error - please disregard.

## 2019-09-19 ENCOUNTER — Other Ambulatory Visit: Payer: Self-pay

## 2019-09-19 ENCOUNTER — Telehealth: Payer: Self-pay | Admitting: *Deleted

## 2019-09-19 ENCOUNTER — Ambulatory Visit (INDEPENDENT_AMBULATORY_CARE_PROVIDER_SITE_OTHER): Payer: 59

## 2019-09-19 DIAGNOSIS — I10 Essential (primary) hypertension: Secondary | ICD-10-CM | POA: Diagnosis not present

## 2019-09-19 DIAGNOSIS — F439 Reaction to severe stress, unspecified: Secondary | ICD-10-CM

## 2019-09-19 DIAGNOSIS — Z5181 Encounter for therapeutic drug level monitoring: Secondary | ICD-10-CM

## 2019-09-19 DIAGNOSIS — Z0000 Encounter for general adult medical examination without abnormal findings: Secondary | ICD-10-CM

## 2019-09-19 MED ORDER — SPIRONOLACTONE 25 MG PO TABS: 25.0000 mg | ORAL_TABLET | Freq: Every day | ORAL | 1 refills | Status: DC

## 2019-09-19 NOTE — Progress Notes (Signed)
Appointment Outcome:  Completed, Session #: 5  AGREEMENTS SECTION   Overall Goal(s): Managing stress Lower Blood Pressure Managing Chronic Diseases Smoking Cessation Reduce Caffeine Intake  Agreement/Action Steps:  Setting and enforcing boundaries Following diet for GERD Switch to decaffeinated coffee Establish an eating routine Exercise Reducing cigarette smoking/nicotine patch   Progress Notes:  Patient has been having a hard time enforcing positive boundaries with husband, but not others. Patient has been able to find balance in dealing with her children although she has experienced some high level stressful events over the course of two weeks.   Patient has considered dissolving relationship with husband due to the amount of stress/strain that she continues to endure. Patient believes that this is contributing to her HBP. Stresses that she continues to pay all the bills and that husband has a substance abuse problem. Patient stated that she is experiencing a lot of headaches lately.   Patient is aware that she is using smoking and caffeine as coping mechanism for stress and tiredness to give her energy. Patient stated that they have to drink something strong to belch from time to time because of GERD and sometimes only drink 1/2. Patient feels that the medication she takes for GERD is not working anymore.  Patient stated that she is not eating properly due to a lack of appetite relative to stress. Patient has not stuck to the eating routine that was outlined two weeks ago that incorporated breakfast and lunch with scheduled snacks.   Patient stated that they have made some changes in their smoking habits (e.g., smoking shorts instead of 100s, going at least an hour or two between cigarettes. Sometimes the patient smokes 1/2 a cigarette.   Indicators of Success and Accountability:  Patient is able to lower BP by reducing the amount of stress that she is experiencing.  Readiness:  Patient is in the action phase with her steps towards achieving her goals. Strengths and Supports: Patient currently has a friend that she relies on for support. Patient knows how to advocate for self and ask questions to ensure that she gets the best care possible. Challenges and Barriers: Patient currently faces some challenging relationships that poses barriers to her managing stress and using smoking and caffeine as coping mechanisms.  Coaching Outcomes: Patient was informed that Dr. Oval Linsey is interested in her reducing the amount of caffeine that she consumes and increasing exercise. Patient stated that they can start drinking decaffeinated coffee to start the process. Patient mentioned that she only drinks some soda (maybe 1/2) when she feels that her medication for GERD is not working and she needs to belch.   Patient was informed that the medication is the best option for managing GERD. Patient is aware of the importance of following a GERD diet.  Patient is concerned about increasing her physical activity outside of work at this time until she visits [facility] for bilateral heel pain. Patient stated that after she can work on walking for at least 30 minutes a day for exercise. Patient has been offered the [facility] Prep program, but declined due to wanting to exercise privately.   Patient was provided the [employer] number so she can start the smoking cessation process. Patient is interested in the nicotine patch and is aware that the organization offers this to those that call. Patient is not interested in taking Chantix or other medications to quit smoking at this time.   Attempted: Partial #- Patient has been enforcing positive boundaries with some  family members, but others have been challenging. Patient has made changes to her smoking habits. Patient has cut out pork all together, don't eat chicken or bacon anymore. Not met #- Patient is currently not eating properly due to a lack of  appetite. Patient has not been able to lower her BP over the course of two weeks even after a medication change.     Not attempted: Deferred #- Patient has considered exercise as important to improving her health but have not been able to prioritize it in her schedule, in addition to suffering injury to her feet. Patient agreed to incorporate walking into her routine after visiting [facility] for bilateral heel pain. Patient will start drinking decaffeinated coffee now. Patient will call [employer] today.   Referrals: [employer]

## 2019-09-19 NOTE — Telephone Encounter (Signed)
-----   Message from Chilton Si, MD sent at 09/18/2019  5:28 PM EDT ----- Regarding: RE: Continued elevation of BP Denise Carter can you have her start spironolactone 25mg .  BMP in a week.  Amy, please work with her on ways to limit caffeine intake, increase exercise. ----- Message ----- From: Sent: 09/18/2019   3:52 PM EDT To: 09/20/2019, RPH-CPP, # Subject: Continued elevation of BP                      Hi team,  I wanted to inform you that this patient continues to have an elevated BP after medication changes. Today her BP was 165/125. I see this patient for health coaching tomorrow. Is there something we can do while she is here tomorrow, or any information I can pass along to her? Her appt with me is at 9:30am.  Thanks, Amy

## 2019-09-19 NOTE — Telephone Encounter (Signed)
Advised patient, verbalized understanding  

## 2019-09-20 ENCOUNTER — Telehealth (INDEPENDENT_AMBULATORY_CARE_PROVIDER_SITE_OTHER): Payer: 59 | Admitting: Cardiology

## 2019-09-20 ENCOUNTER — Encounter: Payer: Self-pay | Admitting: Cardiology

## 2019-09-20 ENCOUNTER — Telehealth: Payer: Self-pay | Admitting: *Deleted

## 2019-09-20 VITALS — Ht 64.0 in | Wt 170.0 lb

## 2019-09-20 DIAGNOSIS — G4733 Obstructive sleep apnea (adult) (pediatric): Secondary | ICD-10-CM

## 2019-09-20 DIAGNOSIS — I10 Essential (primary) hypertension: Secondary | ICD-10-CM

## 2019-09-20 NOTE — Telephone Encounter (Addendum)
[facility] placed to [facility] via fax.  Upon patient request DME selection is [facility]. Patient understands she will be contacted by [facility] to set up her cpap. Patient understands to call if [facility] does not contact her with new setup in a timely manner. Patient understands they will be called once confirmation has been received from [facility] that they have received their new machine to schedule 10 week follow up appointment.  [facility] notified of new cpap order  Please add to airview Patient was grateful for the call and thanked me.

## 2019-09-20 NOTE — Telephone Encounter (Signed)
-----   Message from Quintella Reichert, MD sent at 09/20/2019  9:37 AM EDT ----- Please order ResMed CPAP on auto from 4-15cm H2O with heated humidity and mask of choice.  Followup with me in 8 weeks

## 2019-09-20 NOTE — Progress Notes (Signed)
Virtual Visit via Telephone Note   This visit type was conducted due to national recommendations for restrictions regarding the COVID-19 Pandemic (e.g. social distancing) in an effort to limit this patient's exposure and mitigate transmission in our community.  Due to her co-morbid illnesses, this patient is at least at moderate risk for complications without adequate follow up.  This format is felt to be most appropriate for this patient at this time.  The patient did not have access to video technology/had technical difficulties with video requiring transitioning to audio format only (telephone).  All issues noted in this document were discussed and addressed.  No physical exam could be performed with this format.  Please refer to the patient's chart for her  consent to telehealth for [facility].  Evaluation Performed:  Follow-up visit  This visit type was conducted due to national recommendations for restrictions regarding the COVID-19 Pandemic (e.g. social distancing).  This format is felt to be most appropriate for this patient at this time.  All issues noted in this document were discussed and addressed.  No physical exam was performed (except for noted visual exam findings with Video Visits).  Please refer to the patient's chart (MyChart message for video visits and phone note for telephone visits) for the patient's consent to telehealth for [facility].  Date:  09/20/2019   Denise Carter, DOB August 17, 1970, MRN [id number]  Patient Location:  Home  Provider location:   [place]  PCP:  Arvilla Market, DO  Cardiologist:  Charlton Haws, MD  Sleep Medicine:  Armanda Magic, MD Electrophysiologist:  None   Chief Complaint:  OSA  History of Present Illness:    Denise Carter is a 49 y.o. female who presents via audio/video conferencing for a telehealth visit today.    This is a 49yo AAF with a hx of poorly controlled HTN who has been having problems with  sleeping at night.  She says that she will wake herself from sleep gasping and choking and then she cannot get back to sleep.  She works 4am to [employer] and then comes home and goes to sleep around 2 and wakes up about 7pm and cooks dinner and then will lay back down and rest and fall asleep and then wakes back up at 11pm and stays in bed and cannot sleep and watches tv and then gets up at 2:30am.  She is exhausted all the time.  Her husband says that she snores so bad that he cannot sleep.  She has headaches all the time.   The patient does not have symptoms concerning for COVID-19 infection (fever, chills, cough, or new shortness of breath).    Prior CV studies:   The following studies were reviewed today:  Home sleep study  Past Medical History:  Diagnosis Date  . Atypical chest pain 05/23/2019  . Essential hypertension 05/23/2019  . Hypertension   . Lupus (HCC)   . Shortness of breath 05/23/2019  . Snoring 05/23/2019  . Thyroid disease    Past Surgical History:  Procedure Laterality Date  . ABDOMINAL HYSTERECTOMY       Current Meds  Medication Sig  . amLODipine (NORVASC) 5 MG tablet Take 1 tablet (5 mg total) by mouth daily.  . cetirizine (ZYRTEC) 10 MG tablet Take 1 tablet (10 mg total) by mouth daily. (Patient taking differently: Take 10 mg by mouth as needed. )  . fluticasone (FLONASE) 50 MCG/ACT nasal spray Place 2 sprays into both nostrils daily. (  Patient taking differently: Place 2 sprays into both nostrils as needed. )  . Golimumab (SIMPONI) 50 MG/0.5ML SOSY Inject into the skin every 8 (eight) weeks.  . hydroxychloroquine (PLAQUENIL) 200 MG tablet Take 1 tablet by mouth 2 (two) times a day.  . irbesartan (AVAPRO) 300 MG tablet Take 1 tablet (300 mg total) by mouth daily.  . meclizine (ANTIVERT) 25 MG tablet Take 1 tablet (25 mg total) by mouth 4 (four) times daily as needed for dizziness.  . meloxicam (MOBIC) 15 MG tablet Take 15 mg by mouth daily.  . ondansetron (ZOFRAN) 4  MG tablet Take 1 tablet (4 mg total) by mouth every 8 (eight) hours as needed for nausea or vomiting.  . pregabalin (LYRICA) 150 MG capsule Take 1 capsule (150 mg total) by mouth 2 (two) times daily.  Marland Kitchen spironolactone (ALDACTONE) 25 MG tablet Take 1 tablet (25 mg total) by mouth daily.     Allergies:   Labetalol   Social History   Tobacco Use  . Smoking status: Current Every Day Smoker    Packs/day: 1.00  . Smokeless tobacco: Never Used  Vaping Use  . Vaping Use: Never used  Substance Use Topics  . Alcohol use: No  . Drug use: No     Family Hx: The patient's family history includes CAD in her maternal grandmother; Heart failure in her maternal grandmother; Hypertension in her maternal grandmother and mother; Lupus in her maternal grandmother; Stroke in her maternal grandmother. There is no history of Adrenal disorder.  ROS:   Please see the history of present illness.     All other systems reviewed and are negative.   Labs/Other Tests and Data Reviewed:    Recent Labs: 07/23/2019: TSH 0.97 09/06/2019: BUN 8; Creatinine, Ser 0.76; Potassium 4.4; Sodium 141   Recent Lipid Panel Lab Results  Component Value Date/Time   CHOL 174 10/25/2018 09:13 AM   TRIG 73 10/25/2018 09:13 AM   HDL 57 10/25/2018 09:13 AM   CHOLHDL 3.1 10/25/2018 09:13 AM   LDLCALC 103 (H) 10/25/2018 09:13 AM    Wt Readings from Last 3 Encounters:  09/20/19 170 lb (77.1 kg)  09/06/19 176 lb 3.2 oz (79.9 kg)  08/27/19 176 lb (79.8 kg)     Objective:    Vital Signs:  Ht 5\' 4"  (1.626 m)   Wt 170 lb (77.1 kg)   BMI 29.18 kg/m     ASSESSMENT & PLAN:    1.  OSA -I have reviewed the sleep study with her -she has a lot of problems with insomnia, am headaches, poorly controlled HTN, severe snoring and awakening gasping for breath and choking which I suspect are all related to OSA -I have recommended that we start her on PAP therapy -I will start CPAP on auto from 4-15cm H2O with heated humidity and  mask of choice  2.  HTN -continue amlodipine 5mg  daily, Irbesartan 300mg  daily, spiro 25mg  daily  3.  Overweight -I have encouraged her to get into a routine exercise program and cut back on carbs and portions.  -unfortunately her exercise is limited by pain  COVID-19 Education: The signs and symptoms of COVID-19 were discussed with the patient and how to seek care for testing (follow up with PCP or arrange E-visit).  The importance of social distancing was discussed today.  Patient Risk:   After full review of this patient's clinical status, I feel that they are at least moderate risk at this time.  Time:  Today, I have spent 20 minutes on telemedicine discussing medical problems including OSA, HTN, obesity and reviewing patient's chart including home sleep study.  Medication Adjustments/Labs and Tests Ordered: Current medicines are reviewed at length with the patient today.  Concerns regarding medicines are outlined above.  Tests Ordered: No orders of the defined types were placed in this encounter.  Medication Changes: No orders of the defined types were placed in this encounter.   Disposition:  Follow up in 8 week(s)  Signed, Armanda Magic, MD  09/20/2019 9:32 AM     [facility]

## 2019-09-24 ENCOUNTER — Ambulatory Visit (INDEPENDENT_AMBULATORY_CARE_PROVIDER_SITE_OTHER): Payer: 59 | Admitting: Podiatry

## 2019-09-24 ENCOUNTER — Other Ambulatory Visit: Payer: Self-pay

## 2019-09-24 ENCOUNTER — Ambulatory Visit (INDEPENDENT_AMBULATORY_CARE_PROVIDER_SITE_OTHER): Payer: 59

## 2019-09-24 ENCOUNTER — Encounter: Payer: Self-pay | Admitting: Podiatry

## 2019-09-24 DIAGNOSIS — M216X2 Other acquired deformities of left foot: Secondary | ICD-10-CM

## 2019-09-24 DIAGNOSIS — M79672 Pain in left foot: Secondary | ICD-10-CM

## 2019-09-24 DIAGNOSIS — M216X1 Other acquired deformities of right foot: Secondary | ICD-10-CM

## 2019-09-24 DIAGNOSIS — M79671 Pain in right foot: Secondary | ICD-10-CM

## 2019-09-24 DIAGNOSIS — M722 Plantar fascial fibromatosis: Secondary | ICD-10-CM

## 2019-09-24 NOTE — Patient Instructions (Signed)
For instructions on how to put on your Plantar Fascial Brace, please visit [email or web address]   Plantar Fasciitis (Heel Spur Syndrome) with Rehab The plantar fascia is a fibrous, ligament-like, soft-tissue structure that spans the bottom of the foot. Plantar fasciitis is a condition that causes pain in the foot due to inflammation of the tissue. SYMPTOMS   Pain and tenderness on the underneath side of the foot.  Pain that worsens with standing or walking. CAUSES  Plantar fasciitis is caused by irritation and injury to the plantar fascia on the underneath side of the foot. Common mechanisms of injury include:  Direct trauma to bottom of the foot.  Damage to a small nerve that runs under the foot where the main fascia attaches to the heel bone.  Stress placed on the plantar fascia due to bone spurs. RISK INCREASES WITH:   Activities that place stress on the plantar fascia (running, jumping, pivoting, or cutting).  Poor strength and flexibility.  Improperly fitted shoes.  Tight calf muscles.  Flat feet.  Failure to warm-up properly before activity.  Obesity. PREVENTION  Warm up and stretch properly before activity.  Allow for adequate recovery between workouts.  Maintain physical fitness:  Strength, flexibility, and endurance.  Cardiovascular fitness.  Maintain a health body weight.  Avoid stress on the plantar fascia.  Wear properly fitted shoes, including arch supports for individuals who have flat feet.  PROGNOSIS  If treated properly, then the symptoms of plantar fasciitis usually resolve without surgery. However, occasionally surgery is necessary.  RELATED COMPLICATIONS   Recurrent symptoms that may result in a chronic condition.  Problems of the lower back that are caused by compensating for the injury, such as limping.  Pain or weakness of the foot during push-off following surgery.  Chronic inflammation, scarring, and partial or complete  fascia tear, occurring more often from repeated injections.  TREATMENT  Treatment initially involves the use of ice and medication to help reduce pain and inflammation. The use of strengthening and stretching exercises may help reduce pain with activity, especially stretches of the Achilles tendon. These exercises may be performed at home or with a therapist. Your caregiver may recommend that you use heel cups of arch supports to help reduce stress on the plantar fascia. Occasionally, corticosteroid injections are given to reduce inflammation. If symptoms persist for greater than 6 months despite non-surgical (conservative), then surgery may be recommended.   MEDICATION   If pain medication is necessary, then nonsteroidal anti-inflammatory medications, such as aspirin and ibuprofen, or other minor pain relievers, such as acetaminophen, are often recommended.  Do not take pain medication within 7 days before surgery.  Prescription pain relievers may be given if deemed necessary by your caregiver. Use only as directed and only as much as you need.  Corticosteroid injections may be given by your caregiver. These injections should be reserved for the most serious cases, because they may only be given a certain number of times.  HEAT AND COLD  Cold treatment (icing) relieves pain and reduces inflammation. Cold treatment should be applied for 10 to 15 minutes every 2 to 3 hours for inflammation and pain and immediately after any activity that aggravates your symptoms. Use ice packs or massage the area with a piece of ice (ice massage).  Heat treatment may be used prior to performing the stretching and strengthening activities prescribed by your caregiver, physical therapist, or athletic trainer. Use a heat pack or soak the injury in warm water.  SEEK IMMEDIATE MEDICAL   CARE IF:  Treatment seems to offer no benefit, or the condition worsens.  Any medications produce adverse side effects.   EXERCISES- RANGE OF MOTION (ROM) AND STRETCHING EXERCISES - Plantar Fasciitis (Heel Spur Syndrome) These exercises may help you when beginning to rehabilitate your injury. Your symptoms may resolve with or without further involvement from your physician, physical therapist or athletic trainer. While completing these exercises, remember:   Restoring tissue flexibility helps normal motion to return to the joints. This allows healthier, less painful movement and activity.  An effective stretch should be held for at least 30 seconds.  A stretch should never be painful. You should only feel a gentle lengthening or release in the stretched tissue.  RANGE OF MOTION - Toe Extension, Flexion  Sit with your right / left leg crossed over your opposite knee.  Grasp your toes and gently pull them back toward the top of your foot. You should feel a stretch on the bottom of your toes and/or foot.  Hold this stretch for 10 seconds.  Now, gently pull your toes toward the bottom of your foot. You should feel a stretch on the top of your toes and or foot.  Hold this stretch for 10 seconds. Repeat  times. Complete this stretch 3 times per day.   RANGE OF MOTION - Ankle Dorsiflexion, Active Assisted  Remove shoes and sit on a chair that is preferably not on a carpeted surface.  Place right / left foot under knee. Extend your opposite leg for support.  Keeping your heel down, slide your right / left foot back toward the chair until you feel a stretch at your ankle or calf. If you do not feel a stretch, slide your bottom forward to the edge of the chair, while still keeping your heel down.  Hold this stretch for 10 seconds. Repeat 3 times. Complete this stretch 2 times per day.   STRETCH  Gastroc, Standing  Place hands on wall.  Extend right / left leg, keeping the front knee somewhat bent.  Slightly point your toes inward on your back foot.  Keeping your right / left heel on the floor and your  knee straight, shift your weight toward the wall, not allowing your back to arch.  You should feel a gentle stretch in the right / left calf. Hold this position for 10 seconds. Repeat 3 times. Complete this stretch 2 times per day.  STRETCH  Soleus, Standing  Place hands on wall.  Extend right / left leg, keeping the other knee somewhat bent.  Slightly point your toes inward on your back foot.  Keep your right / left heel on the floor, bend your back knee, and slightly shift your weight over the back leg so that you feel a gentle stretch deep in your back calf.  Hold this position for 10 seconds. Repeat 3 times. Complete this stretch 2 times per day.  STRETCH  Gastrocsoleus, Standing  Note: This exercise can place a lot of stress on your foot and ankle. Please complete this exercise only if specifically instructed by your caregiver.   Place the ball of your right / left foot on a step, keeping your other foot firmly on the same step.  Hold on to the wall or a rail for balance.  Slowly lift your other foot, allowing your body weight to press your heel down over the edge of the step.  You should feel a stretch in your right / left calf.  Hold this   position for 10 seconds.  Repeat this exercise with a slight bend in your right / left knee. Repeat 3 times. Complete this stretch 2 times per day.   STRENGTHENING EXERCISES - Plantar Fasciitis (Heel Spur Syndrome)  These exercises may help you when beginning to rehabilitate your injury. They may resolve your symptoms with or without further involvement from your physician, physical therapist or athletic trainer. While completing these exercises, remember:   Muscles can gain both the endurance and the strength needed for everyday activities through controlled exercises.  Complete these exercises as instructed by your physician, physical therapist or athletic trainer. Progress the resistance and repetitions only as guided.  STRENGTH -  Towel Curls  Sit in a chair positioned on a non-carpeted surface.  Place your foot on a towel, keeping your heel on the floor.  Pull the towel toward your heel by only curling your toes. Keep your heel on the floor. Repeat 3 times. Complete this exercise 2 times per day.  STRENGTH - Ankle Inversion  Secure one end of a rubber exercise band/tubing to a fixed object (table, pole). Loop the other end around your foot just before your toes.  Place your fists between your knees. This will focus your strengthening at your ankle.  Slowly, pull your big toe up and in, making sure the band/tubing is positioned to resist the entire motion.  Hold this position for 10 seconds.  Have your muscles resist the band/tubing as it slowly pulls your foot back to the starting position. Repeat 3 times. Complete this exercises 2 times per day.  Document Released: 01/11/2005 Document Revised: 04/05/2011 Document Reviewed: 04/25/2008 [employer] Patient Information 2014 [employer]. 

## 2019-09-27 NOTE — Progress Notes (Signed)
Subjective:   Patient ID: Denise Carter, female   DOB: 49 y.o.   MRN: [id number]   HPI 50 year old female presents the office today for concerns of bilateral heel pain from the heel to the arch of the foot.  She states it hurts in the morning when she first gets up and after she sits.  After he starts walking feels somewhat better.  She feels a pulling, tightness sensation.  She denies any recent injury or trauma.  She wears steel toed shoes.  She has tried meloxicam with any significant improvement.  No rating pain or weakness.  She has no other concerns today.   Review of Systems  All other systems reviewed and are negative.  Past Medical History:  Diagnosis Date  . Atypical chest pain 05/23/2019  . Essential hypertension 05/23/2019  . Hypertension   . Lupus (HCC)   . Shortness of breath 05/23/2019  . Snoring 05/23/2019  . Thyroid disease     Past Surgical History:  Procedure Laterality Date  . ABDOMINAL HYSTERECTOMY       Current Outpatient Medications:  .  amLODipine (NORVASC) 5 MG tablet, Take 1 tablet (5 mg total) by mouth daily., Disp: 90 tablet, Rfl: 1 .  cetirizine (ZYRTEC) 10 MG tablet, Take 1 tablet (10 mg total) by mouth daily. (Patient taking differently: Take 10 mg by mouth as needed. ), Disp: 30 tablet, Rfl: 11 .  fluticasone (FLONASE) 50 MCG/ACT nasal spray, Place 2 sprays into both nostrils daily. (Patient taking differently: Place 2 sprays into both nostrils as needed. ), Disp: 16 g, Rfl: 6 .  Golimumab (SIMPONI) 50 MG/0.5ML SOSY, Inject into the skin every 8 (eight) weeks., Disp: , Rfl:  .  hydroxychloroquine (PLAQUENIL) 200 MG tablet, Take 1 tablet by mouth 2 (two) times a day., Disp: , Rfl:  .  irbesartan (AVAPRO) 300 MG tablet, Take 1 tablet (300 mg total) by mouth daily., Disp: 90 tablet, Rfl: 3 .  meclizine (ANTIVERT) 25 MG tablet, Take 1 tablet (25 mg total) by mouth 4 (four) times daily as needed for dizziness., Disp: 30 tablet, Rfl: 0 .  meloxicam  (MOBIC) 15 MG tablet, Take 15 mg by mouth daily., Disp: , Rfl:  .  ondansetron (ZOFRAN) 4 MG tablet, Take 1 tablet (4 mg total) by mouth every 8 (eight) hours as needed for nausea or vomiting., Disp: 30 tablet, Rfl: 1 .  pregabalin (LYRICA) 150 MG capsule, Take 1 capsule (150 mg total) by mouth 2 (two) times daily., Disp: 180 capsule, Rfl: 0 .  spironolactone (ALDACTONE) 25 MG tablet, Take 1 tablet (25 mg total) by mouth daily., Disp: 90 tablet, Rfl: 1  Allergies  Allergen Reactions  . Labetalol     Headaches        Objective:  Physical Exam  General: AAO x3, NAD  Dermatological: Skin is warm, dry and supple bilateral.  There are no open sores, no preulcerative lesions, no rash or signs of infection present.  Vascular: Dorsalis Pedis artery and Posterior Tibial artery pedal pulses are 2/4 bilateral with immedate capillary fill time.  There is no pain with calf compression, swelling, warmth, erythema.   Neruologic: Grossly intact via light touch bilateral. Negative tinel sign  Musculoskeletal: Tenderness to palpation along the plantar medial tubercle of the calcaneus at the insertion of plantar fascia on the left and right foot. There is no pain along the course of the plantar fascia within the arch of the foot. Plantar fascia appears to be intact.  There is no pain with lateral compression of the calcaneus or pain with vibratory sensation. There is no pain along the course or insertion of the achilles tendon. No other areas of tenderness to bilateral lower extremities.  Muscular strength 5/5 in all groups tested bilateral.  Equinus is present  Gait: Unassisted, Nonantalgic.       Assessment:   Bilateral heel pain, plantar fasciitis     Plan:  -Treatment options discussed including all alternatives, risks, and complications -Etiology of symptoms were discussed -X-rays were obtained and reviewed with the patient.  No evidence of acute fracture or stress fracture identified  today. -Night splint dispensed x1 -Plantar fascial brace dispensed x2 -Discussed shoe modifications and orthotics.  We will check orthotic coverage -Discussed stretching, icing daily. -We will do steroid injection next point if no improvement.  Vivi Barrack DPM

## 2019-10-04 ENCOUNTER — Ambulatory Visit (INDEPENDENT_AMBULATORY_CARE_PROVIDER_SITE_OTHER): Payer: 59 | Admitting: Pharmacist Clinician (PhC)/ Clinical Pharmacy Specialist

## 2019-10-04 ENCOUNTER — Ambulatory Visit (INDEPENDENT_AMBULATORY_CARE_PROVIDER_SITE_OTHER): Payer: 59

## 2019-10-04 ENCOUNTER — Other Ambulatory Visit: Payer: Self-pay

## 2019-10-04 VITALS — BP 170/112 | HR 87 | Temp 98.0°F | Resp 15 | Ht 64.0 in | Wt 178.4 lb

## 2019-10-04 DIAGNOSIS — I10 Essential (primary) hypertension: Secondary | ICD-10-CM | POA: Diagnosis not present

## 2019-10-04 DIAGNOSIS — Z0000 Encounter for general adult medical examination without abnormal findings: Secondary | ICD-10-CM

## 2019-10-04 DIAGNOSIS — F439 Reaction to severe stress, unspecified: Secondary | ICD-10-CM

## 2019-10-04 DIAGNOSIS — Z720 Tobacco use: Secondary | ICD-10-CM

## 2019-10-04 DIAGNOSIS — R29810 Facial weakness: Secondary | ICD-10-CM

## 2019-10-04 NOTE — Patient Instructions (Signed)
Denise Carter will call you to schedule with Dr. Duke Salvia for some time in October  Check your blood pressure at home daily and keep record of the readings.  Take your BP meds as follows:  Increase spironolactone dose to 2 tablets (50 mg) once daily  Continue with all other medications  Bring all of your meds, your BP cuff and your record of home blood pressures to your next appointment.  Exercise as you're able, try to walk approximately 30 minutes per day.  Keep salt intake to a minimum, especially watch canned and prepared boxed foods.  Eat more fresh fruits and vegetables and fewer canned items.  Avoid eating in fast food restaurants.    HOW TO TAKE YOUR BLOOD PRESSURE: . Rest 5 minutes before taking your blood pressure. .  Don't smoke or drink caffeinated beverages for at least 30 minutes before. . Take your blood pressure before (not after) you eat. . Sit comfortably with your back supported and both feet on the floor (don't cross your legs). . Elevate your arm to heart level on a table or a desk. . Use the proper sized cuff. It should fit smoothly and snugly around your bare upper arm. There should be enough room to slip a fingertip under the cuff. The bottom edge of the cuff should be 1 inch above the crease of the elbow. . Ideally, take 3 measurements at one sitting and record the average.

## 2019-10-04 NOTE — Progress Notes (Signed)
Appointment Outcome:  Completed, Session #: 6  AGREEMENTS SECTION   Overall Goal(s): Decrease caffeine consumption Increase physical activity Stress management Smoking cessation  Address SDOH                                             Agreement/Action Steps:  Minimize the number of sodas and Starbucks ice coffee consumption. Switch to decaffeinated coffee. Walking for 30 mins a day Set boundaries with family and friends Reduce cigarette smoking gradually/Nicotine patches  Progress Notes:  Patient has not been able to engage in additional physical activity due to the pain experienced from plantar fasciitis. Patient has attended doctor visit regarding this condition. Patient has refused to take shots in feet for Plantas Fascitis. This has hindered her ability to engage in extra walking/physical activity outside of work.  Patient tried to reduce caffeine consumption by replacing Starbucks iced coffee and other coffee breaks with decaffeinated coffee. Patient has increased her consumption of Propel water.   Patient is under extreme stress from family circumstances and SDOH needs.  Patient is concerned that she may have had a stroke over the weekend. Patient report having pain in left arm, squeezing sharp pain in chest while lying in bed, eye appears to droop, and trouble recalling words during that time.  Patient is under extreme stress at home and this has been a hindrance to her smoking cessation. Patient is working on reinforcing boundaries with family and friends.   Patient attempted to reduce cigarette smoking but was derailed by family stress.   Patient was not able to secure nicotine patches from [employer].  Indicators of Success and Accountability:  Patient is working on ways that she can address her family concerns without being stressed so that it does not interfere with her smoking cessation progress. . Readiness: Patient is in the action stage of behavior  change. . Strengths and Supports: Patient has a close friend that she communicates with to help navigate stress. Patient has been able to establish positive barriers with family and friends before and can draw from this behavior to re-establish positive boundaries. . Challenges and Barriers: Patient is under extreme stress from family circumstances and SDOH needs.  Coaching Outcomes: Patient will continue to reinforce boundaries with family and friends to help reduce stress. Patient is utilizing her support friend to reduce stress and as a motivator for behavior change.  Patient will increase their water intake to help reduce the amount of caffeine she consumes in a day.  Patient will continue to work with provider to manage Plantas Fascitis. We will revisit the step of walking after work.   Attempted: Marland Kitchen Fulfilled - Patient reached out to [employer] for nicotine patches. . Partial - Patient attempted to reduce caffeine intake with the substitution for decaffeinated coffee. Patient only drank partial of a soda after work on some days, especially when experiencing symptoms of GERD. Patient has attempted to reduce smoking, but progress was hindered by family stress. . Not met - Patient has not been able to walk after work before of Plantas fascitis.   Referrals: Asked patient if she was interested in therapy for possible depression. Patient declined.   Notes: Patient will continue health coaching for another six sessions to work on established goals. Patient has an appt with Pharmacy today and was informed of patient's concern regarding stroke event. Patient was assessed for signs/symptoms.  Will check with patient's PCP to determine if a prescription can be written for nicotine patches.

## 2019-10-04 NOTE — Progress Notes (Signed)
HPI:  Denise Carter is a 49 y.o. female patient of Dr Duke Salvia, who presents today for advanced hypertension clinic follow up.  Her past medical history is significant only for non-cardiac issues including RA, Sjogren's disease and SLE.  Previously she had problems with several medications (see below), but much of it sounds to be related to just lack of effective combinations/doses. Dr. Duke Salvia set her up to see our Care Guide, enrolled her in [facility] exercise program and did blood work to check metanephrines, renin/aldosterone and hypokalemia.  She was given a blood pressure cuff and registered with [facility]. Patient also completed her sleep study was recommended for CPAP by Dr. Mayford Knife. Valsartan was changed to irbesartan, then increased to 300mg  daily on 08/22/2019 after review of [facility] report. Today she returns for follow up.   She was first seen by 08/24/2019, Care Guide today.  Patient voiced concern as to whether she had a stroke over the weekend.  She was noting some stabbing pains in her left side chest/arm, some facial weakness/swelling and difficulty finding words.  Dr. Renaee Munda was called in to review patient and did stroke screening.    Patient reports that she has stopped her Simponi, as she feels it has not given any benefit.  It does have a 3+% of causing hypertension, so will see if any decrease in her pressure now that this is off.  States still having home life stresses with her family and work.    Secondary cause testing: Pheochromocytoma 4/21:Metanephrines WNL  hypoaldosteronism 4/21:Renin low (<0.167), aldosterone WNL, aldost/renin ratio WNL  Sleep apnea 8/21: OSA , CPAP titration recommended  Renovascular disease 12/19: ultrasound WNL     Blood Pressure Goal:  130/80  Current Medications: Irbesartan 300mg  qd, amlodipine 5 mg qd - pm, spironolactone 25 mg   Family Hx: mother died at 86 cervical caner, mgp both with hypertension; father died at 14 due to drug issues; no  whole sibblings; 7 chidren oldest 22 - no known issues for any at this time  Social Hx: still 1/2+  pack per day; no alcohol; caffeine daily (multiple cups of coffee and Coke throughout the day)   diet: low appetite due to meds, notes lost appetite after d/c prednisone about 8-9 months ago (d/c from 20 mg to 10 mg the d/c) - mostly coffee diet right now - however hasn't lost any weight, tried decaf but back on regular  Exercise: [facility] doesn't have any evening schedules, confilcts with work (5 am-10-12 pm at 56) - unable to due to work and family struggles  Home BP readings: 12 readings in [facility] from 8/27 to 9/9: average 141/104; HR range 80-101 20 readings in [facility] from 7/29 to 8/12; average 154/110; HR range 79-112  Intolerances: Labetalol - HA HCTZ - HA Valsartan - diarrhea  Labs:  8/21:  Na 141, K 4.4, Glu 69, BUN 8, SCR 0.76   Wt Readings from Last 3 Encounters:  10/04/19 178 lb 6.4 oz (80.9 kg)  09/20/19 170 lb (77.1 kg)  09/06/19 176 lb 3.2 oz (79.9 kg)   BP Readings from Last 3 Encounters:  10/04/19 (!) 170/112  09/06/19 (!) 164/120  07/23/19 120/62   Pulse Readings from Last 3 Encounters:  10/04/19 87  09/06/19 87  07/23/19 93    Current Outpatient Medications  Medication Sig Dispense Refill  . amLODipine (NORVASC) 5 MG tablet Take 1 tablet (5 mg total) by mouth daily. 90 tablet 1  . cetirizine (  ZYRTEC) 10 MG tablet Take 1 tablet (10 mg total) by mouth daily. (Patient taking differently: Take 10 mg by mouth as needed. ) 30 tablet 11  . fluticasone (FLONASE) 50 MCG/ACT nasal spray Place 2 sprays into both nostrils daily. (Patient taking differently: Place 2 sprays into both nostrils as needed. ) 16 g 6  . Golimumab (SIMPONI) 50 MG/0.5ML SOSY Inject into the skin every 8 (eight) weeks.    . hydroxychloroquine (PLAQUENIL) 200 MG tablet Take 1 tablet by mouth 2 (two) times a day.    . irbesartan (AVAPRO) 300 MG tablet Take 1 tablet (300 mg total) by  mouth daily. 90 tablet 3  . meloxicam (MOBIC) 15 MG tablet Take 15 mg by mouth daily.    . pregabalin (LYRICA) 150 MG capsule Take 1 capsule (150 mg total) by mouth 2 (two) times daily. 180 capsule 0  . spironolactone (ALDACTONE) 25 MG tablet Take 1 tablet (25 mg total) by mouth daily. 90 tablet 1   No current facility-administered medications for this visit.    Allergies  Allergen Reactions  . Labetalol     Headaches    Past Medical History:  Diagnosis Date  . Atypical chest pain 05/23/2019  . Essential hypertension 05/23/2019  . Hypertension   . Lupus (HCC)   . Shortness of breath 05/23/2019  . Snoring 05/23/2019  . Thyroid disease     Blood pressure (!) 170/112, pulse 87, temperature 98 F (36.7 C), resp. rate 15, height 5\' 4"  (1.626 m), weight 178 lb 6.4 oz (80.9 kg), SpO2 98 %.  HTN (hypertension) Patient with systolic/diastolic hypertension, improved from last visit (-7/-6), but still not to goal.  Will increase spironolactone from 25 to 50 mg once daily and continue with other medications.  She will need to get a BMET in 2 weeks to monitor potassium levels.     Reviewed by Dr. 06-28-1986.  Low suspicion of stroke, but will have her get head CT for confirmation.    Patient will need to continue with [facility] home BP monitoring and will follow up with Dr. Duke Salvia in another month.     Duke Salvia PharmD CPP [facility] [address] [place] 10/05/2019 8:39 AM

## 2019-10-05 NOTE — Assessment & Plan Note (Signed)
Patient with systolic/diastolic hypertension, improved from last visit (-7/-6), but still not to goal.  Will increase spironolactone from 25 to 50 mg once daily and continue with other medications.  She will need to get a BMET in 2 weeks to monitor potassium levels.     Reviewed by Dr. Duke Salvia.  Low suspicion of stroke, but will have her get head CT for confirmation.    Patient will need to continue with [facility] home BP monitoring and will follow up with Dr. Duke Salvia in another month.

## 2019-10-10 ENCOUNTER — Other Ambulatory Visit: Payer: 59

## 2019-10-11 ENCOUNTER — Ambulatory Visit (HOSPITAL_COMMUNITY)
Admission: RE | Admit: 2019-10-11 | Discharge: 2019-10-11 | Disposition: A | Discharge status: Home / Self Care | Payer: 59 | Source: Ambulatory Visit | Attending: Cardiovascular Disease | Admitting: Cardiovascular Disease

## 2019-10-11 ENCOUNTER — Other Ambulatory Visit: Payer: Self-pay

## 2019-10-11 ENCOUNTER — Encounter: Payer: 59 | Admitting: Internal Medicine

## 2019-10-11 DIAGNOSIS — R29810 Facial weakness: Secondary | ICD-10-CM | POA: Diagnosis not present

## 2019-10-16 NOTE — Addendum Note (Signed)
Addended by: Berenice Bouton on: 10/16/2019 11:36 AM   Modules accepted: Orders

## 2019-10-17 ENCOUNTER — Ambulatory Visit: Payer: 59

## 2019-10-18 LAB — BASIC METABOLIC PANEL
BUN/Creatinine Ratio: 13 (ref 9–23)
BUN: 10 mg/dL (ref 6–24)
CO2: 23 mmol/L (ref 20–29)
Calcium: 9.2 mg/dL (ref 8.7–10.2)
Chloride: 105 mmol/L (ref 96–106)
Creatinine, Ser: 0.78 mg/dL (ref 0.57–1.00)
GFR calc Af Amer: 103 mL/min/{1.73_m2} (ref >59)
GFR calc non Af Amer: 89 mL/min/{1.73_m2} (ref >59)
Glucose: 78 mg/dL (ref 65–99)
Potassium: 4 mmol/L (ref 3.5–5.2)
Sodium: 141 mmol/L (ref 134–144)

## 2019-10-19 DIAGNOSIS — I10 Essential (primary) hypertension: Secondary | ICD-10-CM | POA: Diagnosis not present

## 2019-10-22 ENCOUNTER — Other Ambulatory Visit: Payer: Self-pay

## 2019-10-22 ENCOUNTER — Ambulatory Visit (INDEPENDENT_AMBULATORY_CARE_PROVIDER_SITE_OTHER): Payer: 59 | Admitting: Podiatry

## 2019-10-22 ENCOUNTER — Ambulatory Visit: Payer: 59 | Admitting: Orthotics

## 2019-10-22 DIAGNOSIS — M79671 Pain in right foot: Secondary | ICD-10-CM

## 2019-10-22 DIAGNOSIS — M79672 Pain in left foot: Secondary | ICD-10-CM

## 2019-10-22 DIAGNOSIS — M722 Plantar fascial fibromatosis: Secondary | ICD-10-CM

## 2019-10-22 DIAGNOSIS — M216X2 Other acquired deformities of left foot: Secondary | ICD-10-CM

## 2019-10-22 DIAGNOSIS — M216X1 Other acquired deformities of right foot: Secondary | ICD-10-CM

## 2019-10-23 ENCOUNTER — Telehealth: Payer: Self-pay

## 2019-10-23 DIAGNOSIS — Z0000 Encounter for general adult medical examination without abnormal findings: Secondary | ICD-10-CM

## 2019-10-23 NOTE — Telephone Encounter (Signed)
Called patient to reschedule appt that was cancelled on 10/17/19. Patient is aware that next appt is scheduled for 10/31/19 at 9:30am.  Patient is interested in going on short-term disability and inquired if Dr. Duke Salvia will be able to assist her with that matter. I will forward the patient's inquiry to Dr. Duke Salvia.

## 2019-10-24 NOTE — Progress Notes (Signed)
Subjective: 49 year old female presents the office today for evaluation of bilateral heel pain, plantar fasciitis.  Overall she has been feeling better.  She has been taking her meloxicam which has been helpful.  She states the night sweats are not helpful.  She has still been in the stretching exercises as well.  No recent injury or changes otherwise. Denies any systemic complaints such as fevers, chills, nausea, vomiting. No acute changes since last appointment, and no other complaints at this time.   Objective: AAO x3, NAD DP/PT pulses palpable bilaterally, CRT less than 3 seconds At this time there is no significant tenderness palpation of the course or insertion of plantar fascial.  No pain with lateral compression of calcaneus.  No pain with Achilles tendon.  There is no other areas of discomfort identified.  Negative tinel sign.  MMT 5/5. No pain with calf compression, swelling, warmth, erythema  Assessment: Resolving foot pain, plantar fasciitis  Plan: -All treatment options discussed with the patient including all alternatives, risks, complications.  -At this point she is doing better.  I encouraged her to continue with stretching, icing daily.  Discussed shoe modifications orthotics.  She can take meloxicam as needed. -Patient encouraged to call the office with any questions, concerns, change in symptoms.   Vivi Barrack DPM

## 2019-10-25 ENCOUNTER — Telehealth: Payer: Self-pay

## 2019-10-25 ENCOUNTER — Telehealth: Payer: Self-pay | Admitting: *Deleted

## 2019-10-25 DIAGNOSIS — Z0000 Encounter for general adult medical examination without abnormal findings: Secondary | ICD-10-CM

## 2019-10-25 NOTE — Telephone Encounter (Signed)
Patient needs [facility] follow up and to discuss endocrinologist outside of [place]  Left message to call back

## 2019-10-25 NOTE — Telephone Encounter (Signed)
Called patient to inform her that she can start the paperwork for short-term disability with her job and Dr. Duke Salvia will be able to assist her with getting whatever paperwork completed that she needs. Left patient a message to return my call at [phone].

## 2019-10-25 NOTE — Telephone Encounter (Signed)
Call came from Denise Carter at [facility] that patient has no called and no showed 3 times ( 10/05/19, 10/10/19, 10/24/19) for her cpap set up appoinment so the order has been canceled until the patient calls to reschedule her appointment for set up.

## 2019-10-31 ENCOUNTER — Ambulatory Visit (INDEPENDENT_AMBULATORY_CARE_PROVIDER_SITE_OTHER): Payer: 59

## 2019-10-31 DIAGNOSIS — F439 Reaction to severe stress, unspecified: Secondary | ICD-10-CM

## 2019-10-31 DIAGNOSIS — Z0000 Encounter for general adult medical examination without abnormal findings: Secondary | ICD-10-CM

## 2019-10-31 DIAGNOSIS — Z720 Tobacco use: Secondary | ICD-10-CM

## 2019-10-31 NOTE — Progress Notes (Signed)
Appointment Outcome:  Completed, Session #: 7   AGREEMENTS SECTION  Overall Goal(s): Lower blood pressure Stress management Increase exercise to lose weight Improve diet  Smoking Cessation                                               Agreement/Action Steps:   Smoking Cessation Reduce cigarette smoking gradually over the course of 3 months Wear nicotine patches daily as directed Need to set a quit date  Increase Exercise to Lose Weight Walking for 30 mins 5xs/wk in addition to the walking that she does at work.  Improve Diet Follow healthy diet for heart health Decrease caffeine consumption and switch to decaffeinated coffee  Stress Management  Patient will work on incorporating 52mns-1hr of personal time a day away from family and friends to decompress. Patient will set boundaries with family and friends to reduce the amount of stress she experiences with stressful encounters.   Progress Notes:  Patient stated that she has continued to enforce her boundaries with her family and friends in spite of how they are treating her and the push back that she has received. Patient stated that that she screens her calls and avoid social media as much as possible. Patient has not attempted to find 338ms - 1hr away from family and friends to decompress daily.  Patient stated that she is ready to quit smoking, but her insurance does not pay for Chantix. Patient has switch to a different type of cigarette to help with smoking cessation. Patient stated that she smokes the most when she is driving, after she eat, and when she copes with stress. Patient states that the smell of smoke bothers her now and she is trying not to smoke more than 1/2 a cigarette a day. Patient don't know when she will be able to quit, so she has not set a quit date yet. Patient stated that she knows she will not be able to quit on her own. Patient is interested in the nicotine patches and wonder if Dr. TiSkeet Latchcan prescribe them.   Patient has not engaged in any additional exercise outside of the walking that she does at work because of Plantar Fasciitis. Patient states that she is in a lot of pain during the day from rheumatoid arthritis which causes her knees and other joints swell and hurt. Patient has tried purchasing inserts for her shoes, but they have not helped. Patient is working with provider to identify the appropriate medications to manage pain.  Patient stated that she is not eating because she does not have an appetite. Patient is not eating a heart healthy diet. Patient continues to eat small meals such as spaghetti that cause her indigestion. Patient stated this is when she is most likely to drink a soda to help her burp. She mentioned that she consumes approximately 1/2 of a soda during this time. Patient attempted to switch to decaffeinated coffee, but she did not like the taste.   Patient informed the Care Guide that she is not sleeping well. Patient is aware that she is scheduled to be tested for sleep apnea, but she refuses to do so. Patient has not been able to manage blood pressure with taking medications and monitoring blood pressure daily with Vivify. Patient has had changes in her medications, which she believes is not working for her.  Patient informed the Care Guide of financial concerns she has with housing, transportation, and financial support from husband. Patient states this adds to her stress with not being financially supported at home. Patient has asked her husband for a divorce. Patient stated that she has reached out to the [facility] and has been put on a waiting list. Patient also inquired about getting Dr. Oval Linsey to fill out her short-term disability forms.   . Indicators of Success and Accountability:  Setting a quit date, improving blood pressure, losing weight . Readiness: Patient is a 3 on the readiness scale. . Strengths and Supports: Patient  has her best friend as a support. Patient is resilient.  . Challenges and Barriers: Stress, pain management, lack of confidence  Coaching Outcomes: Patient will continue to enforce boundaries with family and friends. Patient will find a time during the day that she can be alone to decompress for at least 20mns - 1hr. Patient is thinking of an activity that she wants to do during that time.  Patient will work with Care Guide to identify what a heart healthy diet is and drinks (i.e., increase water intake) that she can substitute the caffeinated drinks she consumes. Patient will start eating (3) small meals during the day that align with a heart healthy diet.  Patient is attempting to work on pain management before she engages in exercise outside of work. Patient has yet to determine how much weight she wants to lose because she was not informed by her provider. Patient will work with provider to get professional insoles for her soles to fit her feet so that she can reduce as much pain as possible to start walking 30 minutes 2-3xs/wk.   Patient will not take cigarettes with her when she is driving. Patient will set a quit date soon. Care Guide will consult with Dr. ROval Linseyto determine if nicotine patches can be prescribed for the patient. Care Guide will also inquire about getting Dr. ROval Linseyto fill out her short-term disability forms. Patient is interested in knowing if [facility] is an option to treat sleep apnea. Dr. TFransico Himis her provider that is treating her for this condition. The Care Guide will reach out to the provider to inquire about this.   Patient will continue to take medications daily as prescribed to control blood pressure. Patient will continue to monitor blood pressure daily with Vivify app.  Patient agreed that she should maintain peaceful communication with her husband while dealing with domestic issues. These strategies also apply to family and friends as she set positive  boundaries.    Attempted: .Marland KitchenFulfilled - Patient has established and continues to maintain boundaries with family and friends to manage stress in spite of the push back she has received.  . Partial - Patient is attempting to reduce her cigarette smoking although she continues to smoke as a coping mechanism. . Not met - Patient consumption of caffeine continues because she believes it helps with GERD like symptoms when they occur. Patient continues to drink coffee before work. Patient is not following a heart healthy diet because she barely eats. Patient has not been able to manage blood pressure with medication and monitoring with Vivify app.   Not attempted: Deferred - Patient has not attempted to walk an additional 30 mins 5xs/wk. Patient stated that she experiences swelling and pain in her feet after working during the day due to Plantar Fasciitis. Patient has not attempted to establish any personal time as agreed  to decompress away from family and friends.

## 2019-11-02 ENCOUNTER — Telehealth: Payer: Self-pay

## 2019-11-02 DIAGNOSIS — Z0000 Encounter for general adult medical examination without abnormal findings: Secondary | ICD-10-CM

## 2019-11-02 NOTE — Telephone Encounter (Signed)
Called patient to discuss the possibility of getting a prescription for nicotine patches. Dr. Duke Salvia advised that she would but the insurance would not cover it and that an FSA/HSA account would. Patient stated that she doesn't have either because she is part time. Informed patient that Care Guide would call her back with more details.  Care Guide will speak to social worker and provider regarding alternative options.

## 2019-11-06 ENCOUNTER — Telehealth: Payer: Self-pay

## 2019-11-06 DIAGNOSIS — Z0000 Encounter for general adult medical examination without abnormal findings: Secondary | ICD-10-CM

## 2019-11-06 NOTE — Telephone Encounter (Signed)
Called patient to inform her that nicotine patches were available for pickup. Patient stated that she would pick them up on 11/07/19 and drop off her short-term disability paperwork from her job so Dr. Duke Salvia can complete her portion.

## 2019-11-07 ENCOUNTER — Telehealth: Payer: Self-pay | Admitting: Cardiovascular Disease

## 2019-11-07 DIAGNOSIS — Z0279 Encounter for issue of other medical certificate: Secondary | ICD-10-CM

## 2019-11-07 NOTE — Telephone Encounter (Signed)
Disability form from [employer] received on 11/07/19. Completed patient authorization attached.Took form to Dr. Leonides Sake mailbox for completion. 11/07/19  fsw Gave Nikki $29.00 cash payment from patient. Gave Bertram Gala a copy of completed billing sheet. 11/07/19  fsw

## 2019-11-08 ENCOUNTER — Telehealth: Payer: Self-pay | Admitting: Cardiovascular Disease

## 2019-11-08 NOTE — Telephone Encounter (Signed)
Encounter not needed

## 2019-11-10 ENCOUNTER — Ambulatory Visit: Payer: 59

## 2019-11-14 ENCOUNTER — Ambulatory Visit (INDEPENDENT_AMBULATORY_CARE_PROVIDER_SITE_OTHER): Payer: 59

## 2019-11-14 DIAGNOSIS — Z0000 Encounter for general adult medical examination without abnormal findings: Secondary | ICD-10-CM

## 2019-11-14 NOTE — Progress Notes (Signed)
Appointment Outcome:  Completed, Session #: 8  AGREEMENTS SECTION   Overall Goal(s): Lower blood pressure - Monitor with Vivify Stress management Increase physical activity Improve diet  Smoking Cessation                                                Agreement/Action Steps:    Smoking Cessation Reduce cigarette smoking gradually over the course of 3 months Wear nicotine patches daily as directed Need to set a quit date   Increase Exercise to Lose Weight Walking for 30 mins 5xs/wk in addition to the walking that she does at work.   Improve Diet Follow healthy diet for heart health Decrease caffeine consumption and switch to decaffeinated coffee   Stress Management  Patient will work on incorporating 56mns-1hr of personal time a day away from family and friends to decompress. Patient will set boundaries with family and friends to reduce the amount of stress she experiences with stressful encounters.  Progress Notes:  Patient is unsure why her BP remains elevated even after taking medications daily and monitoring it with Vivify.   Patient stated that she has refrained from having her iced coffee over the last two weeks. However, she still has regular coffee but does not drink it all because it bothers her stomach. Patient did state that she drinks soda only when she needs to burp because of indigestion. Patient continues to have days when she does not eat full meals or skip meals. Patient has removed pork from her diet. Patient still struggles with removing tomato-based products from her diet.   Patient began using the patch but was derailed due to a stressful family circumstance. She stated that she purchased a pack of cigarettes and has not been using the patches since this event occurred because she isn't supposed to smoke at the same time. Despite this challenge, the patient mentioned that her craving for cigarettes have decreased in the past few days of using the patch. Her  main challenges with smoking occurred when she was driving to and from work, after she ate, and when she was stressed. Patient stated that since she has been away from work, cigarette smoking has declined drastically. Patient has yet to set a quit date.  Patient was able to re-establish healthy boundaries with family and friends. Patient typically screen phone calls or remove herself from heated altercations. Patient states this has been working well for her in the past two weeks. Patient has been able to incorporate 30 minutes of personal time to decompress daily.   Patient has not engaged in any additional physical activity outside of work except for strenuous housework. Patient stated that she is still having difficulties with her Plantar Fasciitis and refused to get injections. Patient states that she had issues with swelling of her ankles and joints. Patient mentioned that she is currently on another medication now for pain management.   . Indicators of Success and Accountability:  Setting an official quit date. . Readiness: Patient is in action phase of implementing all action steps. . Strengths and Supports: Despite challenges that the patient has faced under tremendous amounts of stress, the patient continues to be resilient and persistent towards implementing her action steps. Patient has her best friend as her support. . Challenges and Barriers: Stress is the patient's major challenge/barrier to smoking cessation and can be a  barrier to healthy boundaries.    Coaching Outcomes: Patient will ensure that she monitors her BP with Vivify daily while she continues to take her hypertension medication(s) as prescribed.    Patient will invest in [employer] as a substitute for caffeinated beverages. Patient will work with Care Guide to identify healthier food options to reduce bouts with indigestion and is heart healthy.  Patient stated that she will resume using the patches asap.  Patient will decide on an official quit date by next visit.   Patient will continue to enforce healthy boundaries established with friends and family members and set aside 30 minutes daily for her own personal time to decompress.   Patient will continue to seek care for Plantar Fasciitis to aid in the implementation of additional walking for 30 minutes daily.  Attempted: Marland Kitchen Fulfilled #- Patient has been taking her hypertensive medication(s) as prescribed. Patient has established healthy boundaries with family and friends. Patient has incorporated 30-minutes of personal time to decompress.  . Partial #- Patient is working towards reducing caffeine consumption but did not switch to decafe. Patient started using the patches but stopped momentarily. Patient is monitoring her BP with Vivify but not daily. Patient is still having challenges with implementing a heart healthy diet into her routine. Not met #- Patient has not set a quit date.  Not attempted: Marland Kitchen Deferred #- Patient will work on implementing additional physical activity after she has been properly treated for Plantar Fasciitis.

## 2019-11-15 ENCOUNTER — Other Ambulatory Visit: Payer: 59 | Admitting: Orthotics

## 2019-11-15 NOTE — Telephone Encounter (Signed)
Patient calling in to check on the status of short term disability paperwork that was dropped off on 11/07/2019 to Dr. Leonides Sake office. Please call/advise.   Thank you!

## 2019-11-15 NOTE — Telephone Encounter (Signed)
Appointment scheduled for 10/25

## 2019-11-15 NOTE — Telephone Encounter (Signed)
Spoke with patient and discussed with Dr Duke Salvia She will complete forms for patient to be out of work for 2 weeks

## 2019-11-16 NOTE — Telephone Encounter (Signed)
Forms faxed

## 2019-11-19 ENCOUNTER — Encounter: Payer: Self-pay | Admitting: Cardiovascular Disease

## 2019-11-19 ENCOUNTER — Other Ambulatory Visit: Payer: Self-pay

## 2019-11-19 ENCOUNTER — Ambulatory Visit (INDEPENDENT_AMBULATORY_CARE_PROVIDER_SITE_OTHER): Payer: 59 | Admitting: Cardiovascular Disease

## 2019-11-19 VITALS — BP 144/106 | HR 105 | Ht 64.0 in | Wt 176.0 lb

## 2019-11-19 DIAGNOSIS — M79604 Pain in right leg: Secondary | ICD-10-CM

## 2019-11-19 DIAGNOSIS — G4733 Obstructive sleep apnea (adult) (pediatric): Secondary | ICD-10-CM | POA: Insufficient documentation

## 2019-11-19 DIAGNOSIS — M79605 Pain in left leg: Secondary | ICD-10-CM

## 2019-11-19 DIAGNOSIS — R0602 Shortness of breath: Secondary | ICD-10-CM

## 2019-11-19 HISTORY — DX: Obstructive sleep apnea (adult) (pediatric): G47.33

## 2019-11-19 MED ORDER — HYDRALAZINE HCL 50 MG PO TABS: 50.0000 mg | ORAL_TABLET | Freq: Three times a day (TID) | ORAL | 3 refills | Status: DC

## 2019-11-19 NOTE — Progress Notes (Signed)
Cardiology Office Note   Date:  11/19/2019   ID:  Denise, Carter Jun 12, 1970, MRN [id number]  PCP:  Arvilla Market, DO  Cardiologist:   Chilton Si, MD   No chief complaint on file.    History of Present Illness: Denise Carter is a 49 y.o. female with hypertension, OSA on CPAP, SLE, rheumatoid arthritis, polyclonal gammopathy, Sjogren's disease, and tobacco abuse here for follow-up.  She first found out she had hypertension in 2015 when she was diagnosed with SLE.  Her BP hasn't ever been well-controlled.  She previously saw Dr. Eden Emms 12/2017 for hypertension.  Her blood pressure at that time was around 150 systolic.  She was referred for renal artery Dopplers that were normal.  Labetalol was increased to 300 mg twice daily.  She was seen in the ED 12/2018 with headache and sinus congestion.  Her blood pressure at that time was 192/131.  She works at [employer] and walks a lot at work.  She has random episodes of chest pain and diaphoresis.  The pain is on her R side and under her R breast.  It starts sharp and then subsides.  It is sometimes associated with heart racing.  It seems to occur mostly at rest and regularly with exertion.  She does not get any formal exercise outside of work.  She recently started noticing shortness of breath when walking.  She wonders if it could be related to being overweight or her smoking.  She continues to smoke 1 pack of cigarettes daily.  In the past she tried quitting with Chantix, gum, and she thinks she used Wellbutrin.    At her appointment  there was concern for pheochromocytoma.  Plasma catecholamines and metanephrines were negative. [name] has been under a lot of stress because of her son.  He is still living in [place] and gets into a lot of trouble with the law.  She was having some chest pain he was referred for she had shortness coronary CT-a 05/2019 that revealed normal coronary arteries.  She had shortness of breath and had  an echo that revealed LVEF 60 to 65% with grade 2 diastolic dysfunction.  Right atrial pressure was 3 mmHg.  Renin and aldosterone levels were normal for hyperaldosteronism but were concerning for Cushing syndrome.  She was referred to endocrinology and they did not feel her labs were consistent with this.  She was referred for sleep study and was found to have sleep apnea.  CPAP was recommended.  She is undergone multiple medication changes and been working with our pharmacist.  Valsartan were switched to irbesartan.  She was referred to prep for exercise but unable to participate due to her work schedule.  She last our pharmacist on 09/2019.  At that time her blood pressure was 170/112.  Spironolactone was increased to 50 mg.  However she did not understand this change and has only been taking 25 mg.  Renal function and potassium remained stable with this change.  At that pharmacy appointment she noted facial droop and was referred for CT of the head that was normal.  She is trying to cut back on smoking and reduce stress.  Her arthritis medication was changed and her symptoms haven't been well-controlled.  She notes that her BP goes up when she is stressed.  Her lifting at work aggravates her joints and they get hot and swollen at the end of the day.  She has a poor appetite and isn't eating  much.  She doesn't have much salt in her diet.  She notes pain in her legs walking at work.   Previous antihypertensives: Lisinopril HCTZ Verapamil Amlodipine - headache labetalolol- didn't work Atenolol worked some in [place].  Past Medical History:  Diagnosis Date  . Atypical chest pain 05/23/2019  . Essential hypertension 05/23/2019  . Hypertension   . Lupus (HCC)   . OSA (obstructive sleep apnea) 11/19/2019  . Shortness of breath 05/23/2019  . Snoring 05/23/2019  . Thyroid disease     Past Surgical History:  Procedure Laterality Date  . ABDOMINAL HYSTERECTOMY       Current Outpatient Medications    Medication Sig Dispense Refill  . amLODipine (NORVASC) 5 MG tablet Take 1 tablet (5 mg total) by mouth daily. 90 tablet 1  . cetirizine (ZYRTEC) 10 MG tablet Take 1 tablet (10 mg total) by mouth daily. (Patient taking differently: Take 10 mg by mouth as needed. ) 30 tablet 11  . fluticasone (FLONASE) 50 MCG/ACT nasal spray Place 2 sprays into both nostrils daily. (Patient taking differently: Place 2 sprays into both nostrils as needed. ) 16 g 6  . hydroxychloroquine (PLAQUENIL) 200 MG tablet Take 1 tablet by mouth 2 (two) times a day.    . irbesartan (AVAPRO) 300 MG tablet Take 1 tablet (300 mg total) by mouth daily. 90 tablet 3  . pregabalin (LYRICA) 150 MG capsule Take 1 capsule (150 mg total) by mouth 2 (two) times daily. 180 capsule 0  . RINVOQ 15 MG TB24 Take 1 tablet by mouth daily.    Marland Kitchen spironolactone (ALDACTONE) 25 MG tablet Take 1 tablet (25 mg total) by mouth daily. 90 tablet 1  . hydrALAZINE (APRESOLINE) 50 MG tablet Take 1 tablet (50 mg total) by mouth 3 (three) times daily. 270 tablet 3   No current facility-administered medications for this visit.    Allergies:   Labetalol    Social History:  The patient  reports that she has been smoking. She has been smoking about 1.00 pack per day. She has never used smokeless tobacco. She reports that she does not drink alcohol and does not use drugs.   Family History:  The patient's family history includes CAD in her maternal grandmother; Heart failure in her maternal grandmother; Hypertension in her maternal grandmother and mother; Lupus in her maternal grandmother; Stroke in her maternal grandmother.    ROS:  Please see the history of present illness.   Otherwise, review of systems are positive for none.   All other systems are reviewed and negative.    PHYSICAL EXAM: VS:  BP (!) 144/106   Pulse (!) 105   Ht 5\' 4"  (1.626 m)   Wt 176 lb (79.8 kg)   SpO2 97%   BMI 30.21 kg/m  , BMI Body mass index is 30.21 kg/m. GENERAL:  Well  appearing HEENT:  Pupils equal round and reactive, fundi not visualized, oral mucosa unremarkable NECK:  No jugular venous distention, waveform within normal limits, carotid upstroke brisk and symmetric, no bruits, mild thyromegaly LUNGS:  Clear to auscultation bilaterally HEART:  RRR.  PMI not displaced or sustained,S1 and S2 within normal limits, no S3, no S4, no clicks, no rubs, no murmurs ABD:  Flat, positive bowel sounds normal in frequency in pitch, no bruits, no rebound, no guarding, no midline pulsatile mass, no hepatomegaly, no splenomegaly EXT:  1 plus pulses throughout, no edema, no cyanosis no clubbing SKIN:  No rashes no nodules NEURO:  Cranial nerves  II through XII grossly intact, motor grossly intact throughout [facility]:  Cognitively intact, oriented to person place and time  EKG:  EKG is not ordered today. The ekg ordered today demonstrates sinus rhythm.  Rate 82 bpm.  Nonspecific ST changes.  LAFB.  Cannot rule out anteroseptal infarct   Echo 05/2019: 1. Left ventricular ejection fraction, by estimation, is 60 to 65%. The  left ventricle has normal function. The left ventricle has no regional  wall motion abnormalities. There is mild concentric left ventricular  hypertrophy. Left ventricular diastolic  parameters are consistent with Grade II diastolic dysfunction  (pseudonormalization). Elevated left ventricular end-diastolic pressure.  2. Right ventricular systolic function is normal. The right ventricular  size is normal. There is normal pulmonary artery systolic pressure.  3. Left atrial size was mildly dilated.  4. The mitral valve is normal in structure. Trivial mitral valve  regurgitation. No evidence of mitral stenosis.  5. The aortic valve is tricuspid. Aortic valve regurgitation is not  visualized. No aortic stenosis is present.  6. Aortic dilatation noted. There is mild dilatation of the ascending  aorta measuring 38 mm.  7. The inferior vena cava is normal  in size with greater than 50%  respiratory variability, suggesting right atrial pressure of 3 mmHg.   Recent Labs: 07/23/2019: TSH 0.97 10/18/2019: BUN 10; Creatinine, Ser 0.78; Potassium 4.0; Sodium 141    Lipid Panel    Component Value Date/Time   CHOL 174 10/25/2018 0913   TRIG 73 10/25/2018 0913   HDL 57 10/25/2018 0913   CHOLHDL 3.1 10/25/2018 0913   LDLCALC 103 (H) 10/25/2018 0913      Wt Readings from Last 3 Encounters:  11/19/19 176 lb (79.8 kg)  10/04/19 178 lb 6.4 oz (80.9 kg)  09/20/19 170 lb (77.1 kg)      ASSESSMENT AND PLAN:  # Essential hypertension:  Blood pressure has been very difficult to control.  She has been participating in our remote patient monitor program and is checking her blood pressures regularly.  They are consistently elevated.  She especially has high diastolic pressures.  We will try adding hydralazine 50 mg 3 times daily.  She did not understand the instructions to increase her spironolactone as still been taking 25 mg daily.  We will keep her on this dose.  Continue amlodipine 5 mg a day.  She did not tolerate higher doses.  Continue irbesartan.  She has been trying to apply for disability.  Her stress and anxiety definitely contribute to her poorly controlled hypertension her pain from rheumatoid arthritis also contributes.  We will give her disability for a couple weeks to try and work on getting these things under better control.  She understands that if she needs long-term disability this will need to come from her PCP or rheumatology.  We will have be happy to provide any supportive documents for her blood pressure.  Secondary Causes of Hypertension  Medications/Herbal: OCP, steroids, stimulants, antidepressants, weight loss medication, immune suppressants, NSAIDs, sympathomimetics, alcohol, caffeine, licorice, ginseng, St. John's wort, chemo  Sleep Apnea: CPAP recommended Renal artery stenosis: Renal artery Dopplers -  12/2017 Hyperaldosteronism: Check 04/2019 Hyper/hypothyroidism: Normal 06/2019 Pheochromocytoma: - 06/2019 Cushing's syndrome: Testing not indicated Coarctation of the aorta: Blood pressure symmetric  # Chest pain:  CT-A negative 09/2019.  Calcium score was 0.  # Shortness of breath:   Echo revealed diastolic dysfunction but normal systolic function.  Right atrial pressure was 3.  I suspect her diastolic dysfunction is attributable  to her poorly controlled hypertension.  Coronary CT-a was negative for any coronary disease.  Continue with blood pressure control as above and working on smoking cessation.  Use CPAP for sleep apnea.  # Tobacco abuse: Continue to work with our care guide on smoking cessation.    Current medicines are reviewed at length with the patient today.  The patient does not have concerns regarding medicines.  The following changes have been made: Add hydralazine Labs/ tests ordered today include:   Orders Placed This Encounter  Procedures  . VAS [place] ABI WITH/WO TBI  . VAS [place] LOWER EXTREMITY ARTERIAL DUPLEX     Disposition:   FU with Gionna Polak C. Duke Salvia, MD, Operating Room Services in 1 month    Signed, Hedwig Mcfall C. Duke Salvia, MD, [facility]  11/19/2019 2:24 PM    [facility]

## 2019-11-19 NOTE — Patient Instructions (Addendum)
Medication Instructions:  START HYDRALAZINE 50 MG THREE TIMES A DAY   Labwork: NONE   Testing/Procedures: Your physician has requested that you have an ankle brachial index (ABI). During this test an ultrasound and blood pressure cuff are used to evaluate the arteries that supply the arms and legs with blood. Allow thirty minutes for this exam. There are no restrictions or special instructions.  Follow-Up: 1 MONTH WITH DR [facility] 12/24/2019 AT 10:45 AM

## 2019-11-23 ENCOUNTER — Other Ambulatory Visit: Payer: Self-pay

## 2019-11-23 ENCOUNTER — Other Ambulatory Visit: Payer: Self-pay | Admitting: Cardiovascular Disease

## 2019-11-23 DIAGNOSIS — G6289 Other specified polyneuropathies: Secondary | ICD-10-CM

## 2019-11-23 DIAGNOSIS — M79605 Pain in left leg: Secondary | ICD-10-CM

## 2019-11-23 DIAGNOSIS — I739 Peripheral vascular disease, unspecified: Secondary | ICD-10-CM

## 2019-11-23 DIAGNOSIS — M79604 Pain in right leg: Secondary | ICD-10-CM

## 2019-11-23 MED ORDER — PREGABALIN 150 MG PO CAPS: 150.0000 mg | ORAL_CAPSULE | Freq: Two times a day (BID) | ORAL | 0 refills | Status: DC

## 2019-11-24 ENCOUNTER — Ambulatory Visit: Payer: 59 | Attending: Internal Medicine

## 2019-11-24 DIAGNOSIS — Z23 Encounter for immunization: Secondary | ICD-10-CM

## 2019-11-24 NOTE — Progress Notes (Signed)
   Covid-19 Vaccination Clinic  Name:  Denise Carter    MRN: [id number] DOB: 1970-10-25  11/24/2019  Denise Carter was observed post Covid-19 immunization for 15 minutes without incident. She was provided with Vaccine Information Sheet and instruction to access the V-Safe system.   Denise Carter was instructed to call 911 with any severe reactions post vaccine: Marland Kitchen Difficulty breathing  . Swelling of face and throat  . A fast heartbeat  . A bad rash all over body  . Dizziness and weakness

## 2019-11-28 ENCOUNTER — Ambulatory Visit (INDEPENDENT_AMBULATORY_CARE_PROVIDER_SITE_OTHER): Payer: 59

## 2019-11-28 ENCOUNTER — Telehealth: Payer: Self-pay | Admitting: Licensed Clinical Social Worker

## 2019-11-28 ENCOUNTER — Other Ambulatory Visit: Payer: Self-pay

## 2019-11-28 DIAGNOSIS — Z0000 Encounter for general adult medical examination without abnormal findings: Secondary | ICD-10-CM

## 2019-11-28 NOTE — Progress Notes (Signed)
Appointment Outcome:  Completed, Session #: 9  AGREEMENTS SECTION   Overall Goal(s): Lower BP - monitor with Vivify                                      Stress management Increase physical activity Decrease caffeine consumption Improve diet for heart health  Agreement/Action Steps:  Check blood pressure daily using Vivify app and take medication daily Establish and maintain boundaries with family and friends Start using nicotine patches Increase water intake/reduce coffee consumption Incorporate heart healthy diet Walk after work for 30 mins daily   Progress Notes:  Patient presented today as apathetic, low energy, and extremely stressed/depressed.   Patient stated that she currently takes about 6 medications to manage her blood pressure. However, a new medication side effects includes headaches. This medication is supposed to be taken 3x/day. The patient stated that she only takes it in the morning and nighttime to reduce how bad the headaches are. Patient does not check blood pressure daily with Vivify. Patient does not know why her blood pressure remains elevated.  Patient has been experiencing a lot of family issues that have caused her a lot of stress. Patient have been patiently and constantly enforcing healthy boundaries between her and her family members/friends. Patient stated that they retaliate by saying that she is mean, and not being respectful of her boundaries. Patient stated that in a few situations she had to remove herself or leave the house. Patient feels that she is responsible for taking care of her family, but the situations they find themselves in are significantly stressful and financially straining. Patient stated that she is at her breaking point now.  Patient states that she does try to decompress by watching HGTV but is interrupted by her husband in an unpleasant manner. Patient also finds cleaning her home a peaceful time for her.   She is experiencing  financial hardship as well. Patient is behind on her car payment, rent, and other bills because of the financial strain from supporting her children and covering her husband portion of the bills. Patient has gone to the [place] for rental assistance on October 19th. Patient has been informed of the process for the application processing. Care Guide asked patient if she had used the resource, [employer], that was recommended. Patient stated that she reached out and their guidelines did not match her needs.   Patient was anticipating being able to come out of work on short-term disability but was not able to get the provider to fill out the necessary paperwork. Patient havs to return to work. Due to her conditions and pain, she is going to retire from her job and cash out her 401k. Patient does have an upcoming appointment to be assessed for Peripheral Artery Disease (PAD) on Nov 15th.  The patient has been sleeping more during the day unless she has somewhere to go. During the night, the patient's mind is constantly wondering, and she have a hard time going to sleep.  Patient is not eating on a consistent basis or following a heart healthy diet. Patient stated that she has not had an appetite over the last two weeks. Patient continues to drink caffeinated coffee and an occasional soda.   Patient has attempted to incorporate nicotine patches daily for smoking cessation. Patient states that they are not working for her and that she still  finds herself smoking with them on. Today, the patient was not wearing her patch at the time of the session. Patient mentioned that she felt that she needed to smoke to keep from having an outburst.    . Indicators of Success and Accountability:  Being able to reduce stress by enforcing healthy boundaries and not contributing to any stress by removing self from verbal altercations.  . Readiness: Patient is in action  phase of lowering blood pressure, smoking cessation, stress management, and decreasing caffeine consumption. Patient is in the contemplation stage of behavior change for increasing physical activity and implementing a diet for a healthy heart.  . Strengths and Supports: Patient has a friend for support but need additional support. Patient has persevered through hardships and stress, and she is resilient.  . Challenges and Barriers: Stress and financial constraints  Coaching Outcomes: Patient stated that she will invest in getting some Pedialyte Sports packets that you can add to water. Patient also is interested in trying flavored [employer] water from [employer] to reduce the amount of caffeine that she consumes. Patient will work with [place] on identifying strategies to improve diet during next visit. Patient has not decided when would be a good time for her to implement physical activity into her daily routine due to Plantar Fasciitis.   Patient will follow medication regimen as prescribed until she communicates with her provider about the headaches. Patient will resume checking her blood pressure with Vivify daily.  Patient will continue to enforce health boundaries, remove herself for stressful situations. Patient will continue to carve out time to decompress through cleaning and watching her tv show.   Patient stated that they will resume using the patches on November 4th. Patient is interested in another method for smoking cessation. Patient will set a quit date during next session.  Care Guide will reach out to Dr. Skeet Latch to inquire about a prescription for Wellbutrin.   Care Guide will work with Westley Hummer (LCSW) to identify resources to assist patient with her SDOH.  Attempted: Marland Kitchen Fulfilled #- Patient is still setting boundaries. Patient is removing herself from stressful situations. Patient is taking medications as prescribed.  . Partial #- Patient attempted to wear the  nicotine patches but feel they are not working for her. Patient did smoke at times with the patches.  . Not met #- Patient has not set a quit date or implemented a heart healthy diet.    Not attempted: Marland Kitchen Deferred #- Patient will work on implementing additional physical activity after she has been properly treated for Plantar Fasciitis.     Referrals: Patient is interested in a referral to behavioral health. Care Guide will work with Education officer, museum at [employer] for referral options.

## 2019-11-28 NOTE — Progress Notes (Signed)
Heart and Vascular Care Navigation  11/28/2019  Denise Carter 12-17-1970 [id number]  Reason for Referral: Verbal Referral from [place] for pt regarding financial challenges around rent/care payments.                                                                                                    Assessment:     CSW spoke with pt via telephone at [phone]. Introduced self, role, reason for call. Confirmed current home address, phone number, e-mail, and primary emergency contact name and number. Pt is an active pt with our HTN clinic, sees care guide/health coach Amy, and is followed at [facility]. Her PCP is Dr. Juleen China at [facility]. She currently is employed by [employer] and is returning after a 2 week short term disability period. She lives with her husband Jeneen Rinks and her son (65 yo). She has 7 children altogether. She states her husband is intermittently employed and her son is not currently working. Therefore the family is relying on her single income to cover all the bills. She is able to get her medications at the [facility] and currently has no issues getting her food needs met. Her two biggest financial stressors are related to her car payments and her rent. She is close to being evicted and per her report has challenges with her car payments. She has applied for assistance through the [facility] but when she last called she was told it likely would be 30 days before she has a determination.   She is interested in any other assistance programs that may be available and getting connected with a mental health provider to also assist with long term stress management. CSW provided verbal support and let her know we would get get her additional resource support and pass on her concerns to the care navigation department to see if there was any additional support we could connect her with.                                   HRT/VAS Care Coordination    Patients  Home Cardiology Office [place]   Outpatient Care Team Social Worker   Social Worker Name: [place], LCSW, [place] arrangements for the past 2 months Apartment   Lives with: Spouse; Adult Children   Patient Current Librarian, academic   Patient Has Concern With Paying Medical Bills No   Does Patient Have Prescription Coverage? No      Social History:                                                                             SDOH Screenings   Alcohol Screen:   .  Last Alcohol Screening Score (AUDIT): Not on file  Depression (PHQ2-9): Medium Risk  . PHQ-2 Score: 14  Financial Resource Strain: High Risk  . Difficulty of Paying Living Expenses: Very hard  Food Insecurity: No Food Insecurity  . Worried About Charity fundraiser in the Last Year: Never true  . Ran Out of Food in the Last Year: Never true  Housing: High Risk  . Last Housing Risk Score: 2  Physical Activity: Inactive  . Days of Exercise per Week: 0 days  . Minutes of Exercise per Session: 0 min  Social Connections:   . Frequency of Communication with Friends and Family: Not on file  . Frequency of Social Gatherings with Friends and Family: Not on file  . Attends Religious Services: Not on file  . Active Member of Clubs or Organizations: Not on file  . Attends Archivist Meetings: Not on file  . Marital Status: Not on file  Stress: Stress Concern Present  . Feeling of Stress : Very much  Tobacco Use: High Risk  . Smoking Tobacco Use: Current Every Day Smoker  . Smokeless Tobacco Use: Never Used  Transportation Needs: No Transportation Needs  . Lack of Transportation (Medical): No  . Lack of Transportation (Non-Medical): No    SDOH Interventions: Financial Resources:  Financial Strain Interventions: Other (Comment) (HOPE Program; Evaluation for Patient [place]) Pt was briefly on short term disability, is currently the only working  member in her household. Is helping her 46 year old son who is living with her.   Food Insecurity:  Food Insecurity Interventions: Intervention Not Indicated- pt cites no issues with obtaining food at this time. Not receiving food stamps.   Housing Insecurity:  Housing Interventions: Other (Comment) (HOPE Program application; Evaluation for Patient [place])  Transportation:   Transportation Interventions: Intervention Not Indicated    Other Care Navigation Interventions:     Patient expressed Mental Health concerns Pt w/ stress related to financial concerns, ongoing health challenges, relationship stress, caregiver stress.  Patient Referred to: CSW provided pt with [place] list of local mental health providers; verbal support for ongoing stressors   Follow-up plan:    CSW confirmed pt able to receive emails to [email or web address]. Sent her crisis assistance packet along with [place] packet of providers. CSW also sent through [place] list and encouraged pt to reach back out to CSW or Health Coach if she needed assistance with making an appointment.   CSW also notified Care Navigation team lead Kennyth Lose about pt concerns. I called pt back and was able to request that she bring in documentation of her car payment and rent in order for our team to assess if any additional support available. Pt will bring to front desk at [facility] office. Front desk staff aware and offered to make copies for this Probation officer. The front desk staff will alert CSW when pt has brought paperwork and I will securely send through to Care Navigation leadership.   Westley Hummer, MSW, [place] Work

## 2019-11-30 ENCOUNTER — Telehealth: Payer: Self-pay | Admitting: Licensed Clinical Social Worker

## 2019-11-30 NOTE — Telephone Encounter (Signed)
Message left for pt at [phone] inquiring about additional statements that had been discussed on 11/4. Return call requested to Amy Lee's number at [phone].  CSW continuing to follow for support w/ financial concerns.   Octavio Graves, MSW, LCSW [facility] Clinical Social Work

## 2019-11-30 NOTE — Telephone Encounter (Signed)
Pt called this writer back states she has requested the additional statement and is waiting on it to come. She is checking her email but hasnt received it yet. She will bring it to the office today if she is able to get it before the weekend.   Octavio Graves, MSW, LCSW [facility] Clinical Social Work

## 2019-12-04 ENCOUNTER — Telehealth: Payer: Self-pay | Admitting: Licensed Clinical Social Worker

## 2019-12-04 ENCOUNTER — Telehealth: Payer: Self-pay | Admitting: Pharmacist Clinician (PhC)/ Clinical Pharmacy Specialist

## 2019-12-04 NOTE — Telephone Encounter (Signed)
BP readings in [employer] - still elevated to > 100 diastolic most days.  Will have her increase spironolactone to 50 mg qd.  LM for patient to call back and verify dose increase and need for repeat metabolic panel 2 weeks after dose increase.

## 2019-12-04 NOTE — Telephone Encounter (Signed)
CSW spoke with pt via telephone, she will come and get rental assistance check at the front desk of [facility] office. Other check will be mailed directly.   Octavio Graves, MSW, LCSW [facility] Clinical Social Work

## 2019-12-05 ENCOUNTER — Encounter: Payer: Self-pay | Admitting: Internal Medicine

## 2019-12-05 ENCOUNTER — Other Ambulatory Visit: Payer: Self-pay

## 2019-12-05 ENCOUNTER — Ambulatory Visit (INDEPENDENT_AMBULATORY_CARE_PROVIDER_SITE_OTHER): Payer: 59 | Admitting: Internal Medicine

## 2019-12-05 VITALS — BP 187/132 | HR 79 | Temp 98.3°F | Resp 17 | Wt 173.0 lb

## 2019-12-05 DIAGNOSIS — Z1231 Encounter for screening mammogram for malignant neoplasm of breast: Secondary | ICD-10-CM

## 2019-12-05 DIAGNOSIS — G6289 Other specified polyneuropathies: Secondary | ICD-10-CM

## 2019-12-05 DIAGNOSIS — Z130 Encounter for screening for diseases of the blood and blood-forming organs and certain disorders involving the immune mechanism: Secondary | ICD-10-CM | POA: Diagnosis not present

## 2019-12-05 DIAGNOSIS — Z0000 Encounter for general adult medical examination without abnormal findings: Secondary | ICD-10-CM

## 2019-12-05 DIAGNOSIS — Z114 Encounter for screening for human immunodeficiency virus [HIV]: Secondary | ICD-10-CM

## 2019-12-05 DIAGNOSIS — Z1159 Encounter for screening for other viral diseases: Secondary | ICD-10-CM

## 2019-12-05 DIAGNOSIS — Z113 Encounter for screening for infections with a predominantly sexual mode of transmission: Secondary | ICD-10-CM

## 2019-12-05 DIAGNOSIS — Z13220 Encounter for screening for lipoid disorders: Secondary | ICD-10-CM

## 2019-12-05 DIAGNOSIS — I10 Essential (primary) hypertension: Secondary | ICD-10-CM

## 2019-12-05 MED ORDER — PREGABALIN 150 MG PO CAPS: 150.0000 mg | ORAL_CAPSULE | Freq: Two times a day (BID) | ORAL | 1 refills | Status: DC

## 2019-12-05 NOTE — Progress Notes (Signed)
Subjective:    Denise Carter - 49 y.o. female MRN [id number]  Date of birth: December 24, 1970  HPI  Denise Carter is here for annual exam. Has no acute concerns today.      Health Maintenance:  Health Maintenance Due  Topic Date Due  . Hepatitis C Screening  Never done  . HIV Screening  Never done  . TETANUS/TDAP  Never done  . MAMMOGRAM  Never done  . INFLUENZA VACCINE  08/26/2019    -  reports that she has been smoking. She has been smoking about 1.00 pack per day. She has never used smokeless tobacco. - Review of Systems: Per HPI. - Past Medical History: Patient Active Problem List   Diagnosis Date Noted  . OSA (obstructive sleep apnea) 11/19/2019  . Thyroid nodule 07/23/2019  . Atypical chest pain 05/23/2019  . Resistant hypertension 05/23/2019  . Snoring 05/23/2019  . Rheumatoid arthritis without rheumatoid factor, multiple sites (HCC) 06/22/2018  . Polyarthralgia 06/22/2018  . Sjogren's disease (HCC) 06/22/2018  . Polyclonal gammopathy determined by serum protein electrophoresis 01/30/2017   - Medications: reviewed and updated   Objective:   Physical Exam BP (!) 187/132   Pulse 79   Temp 98.3 F (36.8 C) (Temporal)   Resp 17   Wt 173 lb (78.5 kg)   SpO2 95%   BMI 29.70 kg/m  Physical Exam Constitutional:      Appearance: She is not diaphoretic.  HENT:     Head: Normocephalic and atraumatic.  Eyes:     Conjunctiva/sclera: Conjunctivae normal.     Pupils: Pupils are equal, round, and reactive to light.  Neck:     Thyroid: No thyromegaly.  Cardiovascular:     Rate and Rhythm: Normal rate and regular rhythm.     Heart sounds: Normal heart sounds. No murmur heard.   Pulmonary:     Effort: Pulmonary effort is normal. No respiratory distress.     Breath sounds: Normal breath sounds. No wheezing.  Abdominal:     General: Bowel sounds are normal. There is no distension.     Palpations: Abdomen is soft.     Tenderness: There is no abdominal  tenderness. There is no guarding or rebound.  Musculoskeletal:        General: No deformity. Normal range of motion.     Cervical back: Normal range of motion and neck supple.  Lymphadenopathy:     Cervical: No cervical adenopathy.  Skin:    General: Skin is warm and dry.     Findings: No rash.  Neurological:     Mental Status: She is alert and oriented to person, place, and time.     Gait: Gait is intact.  Psychiatric:        Mood and Affect: Mood and affect normal.        Judgment: Judgment normal.            Assessment & Plan:  1. Annual physical exam Counseled on 150 minutes of exercise per week, healthy eating (including decreased daily intake of saturated fats, cholesterol, added sugars, sodium), STI prevention, routine healthcare maintenance. - CBC with Differential - Lipid Panel  2. Screening for deficiency anemia - CBC with Differential  3. Lipid screening - Lipid Panel  4. Breast cancer screening by mammogram - MM Digital Screening; Future  5. Need for hepatitis C screening test - HCV Ab w/Rflx to Verification  6. Screening for HIV (human immunodeficiency virus) - HIV antibody (with reflex)  7.  Other polyneuropathy - pregabalin (LYRICA) 150 MG capsule; Take 1 capsule (150 mg total) by mouth 2 (two) times daily.  Dispense: 180 capsule; Refill: 1  8. Resistant hypertension Patient with significantly elevated BP. Asymptomatic. On multiple antihypertensive agents. Followed by cardiology. Will defer to cards for further management. Has upcoming appointment--discussed importance of keep this appointment.    Marcy Siren, D.O. 12/05/2019, 10:50 AM Primary Care at [facility]

## 2019-12-06 ENCOUNTER — Other Ambulatory Visit: Payer: Self-pay | Admitting: Internal Medicine

## 2019-12-06 LAB — CBC WITH DIFFERENTIAL/PLATELET
Basophils Absolute: 0 10*3/uL (ref 0.0–0.2)
Basos: 0 %
EOS (ABSOLUTE): 0 10*3/uL (ref 0.0–0.4)
Eos: 1 %
Hematocrit: 40.8 % (ref 34.0–46.6)
Hemoglobin: 13 g/dL (ref 11.1–15.9)
Immature Grans (Abs): 0 10*3/uL (ref 0.0–0.1)
Immature Granulocytes: 0 %
Lymphocytes Absolute: 2.1 10*3/uL (ref 0.7–3.1)
Lymphs: 24 %
MCH: 27.4 pg (ref 26.6–33.0)
MCHC: 31.9 g/dL (ref 31.5–35.7)
MCV: 86 fL (ref 79–97)
Monocytes Absolute: 0.5 10*3/uL (ref 0.1–0.9)
Monocytes: 6 %
Neutrophils Absolute: 5.8 10*3/uL (ref 1.4–7.0)
Neutrophils: 69 %
Platelets: 167 10*3/uL (ref 150–450)
RBC: 4.74 x10E6/uL (ref 3.77–5.28)
RDW: 13.4 % (ref 11.7–15.4)
WBC: 8.4 10*3/uL (ref 3.4–10.8)

## 2019-12-06 LAB — HCV AB W/RFLX TO VERIFICATION: HCV Ab: <0.1 s/co ratio (ref 0.0–0.9)

## 2019-12-06 LAB — LIPID PANEL
Chol/HDL Ratio: 2.9 ratio (ref 0.0–4.4)
Cholesterol, Total: 196 mg/dL (ref 100–199)
HDL: 68 mg/dL (ref >39)
LDL Chol Calc (NIH): 116 mg/dL — ABNORMAL HIGH (ref 0–99)
Triglycerides: 65 mg/dL (ref 0–149)
VLDL Cholesterol Cal: 12 mg/dL (ref 5–40)

## 2019-12-06 LAB — HCV INTERPRETATION

## 2019-12-06 LAB — HIV ANTIBODY (ROUTINE TESTING W REFLEX): HIV Screen 4th Generation wRfx: NONREACTIVE

## 2019-12-06 MED ORDER — ASPIRIN 81 MG PO TBEC: 81.0000 mg | DELAYED_RELEASE_TABLET | Freq: Every day | ORAL | 12 refills | Status: AC

## 2019-12-06 MED ORDER — ATORVASTATIN CALCIUM 40 MG PO TABS: 40.0000 mg | ORAL_TABLET | Freq: Every day | ORAL | 3 refills | Status: DC

## 2019-12-10 ENCOUNTER — Ambulatory Visit (HOSPITAL_COMMUNITY)
Admission: RE | Admit: 2019-12-10 | Discharge: 2019-12-10 | Disposition: A | Discharge status: Home / Self Care | Payer: 59 | Source: Ambulatory Visit | Attending: Internal Medicine | Admitting: Internal Medicine

## 2019-12-10 DIAGNOSIS — I739 Peripheral vascular disease, unspecified: Secondary | ICD-10-CM | POA: Diagnosis not present

## 2019-12-10 DIAGNOSIS — M79604 Pain in right leg: Secondary | ICD-10-CM

## 2019-12-10 DIAGNOSIS — M79605 Pain in left leg: Secondary | ICD-10-CM | POA: Insufficient documentation

## 2019-12-10 NOTE — Progress Notes (Signed)

## 2019-12-11 ENCOUNTER — Telehealth: Payer: Self-pay | Admitting: Cardiovascular Disease

## 2019-12-11 NOTE — Telephone Encounter (Signed)
[employer] short term disability forms have been faxed, and scanned into the patients chart.

## 2019-12-12 ENCOUNTER — Telehealth: Payer: Self-pay

## 2019-12-12 ENCOUNTER — Ambulatory Visit: Payer: 59

## 2019-12-12 DIAGNOSIS — Z0000 Encounter for general adult medical examination without abnormal findings: Secondary | ICD-10-CM

## 2019-12-12 NOTE — Telephone Encounter (Signed)
Called patient to see if she wanted to reschedule missed appt. Patient is now scheduled for 12/19/19 at 9:30am.

## 2019-12-13 NOTE — Telephone Encounter (Signed)
Spoke with patient - states having headaches and is trying to determine which medication is the cause. She doesn't think it's the irbesartan or spironolactone.  Will have her increase spironolactone to 50 mg daily and repeat metabolic panel in 10 days when she comes to see Dr. Duke Salvia for [facility]

## 2019-12-19 ENCOUNTER — Ambulatory Visit (INDEPENDENT_AMBULATORY_CARE_PROVIDER_SITE_OTHER): Payer: 59

## 2019-12-19 ENCOUNTER — Other Ambulatory Visit: Payer: Self-pay

## 2019-12-19 DIAGNOSIS — Z0000 Encounter for general adult medical examination without abnormal findings: Secondary | ICD-10-CM

## 2019-12-19 NOTE — Progress Notes (Signed)
Appointment Outcome:  Completed, Session #: 10  AGREEMENTS SECTION    Overall Goal(s): Lower BP Stress management Improve diet for health/acid reflux Lower LDL Smoking cessation                                                Agreement/Action Steps:  Take HTN meds as prescribed Establish and maintain healthy boundaries with family and friends Decrease caffeine by reducing coffee consumption Increase water intake by adding flavor packets to water bottle Research heart healthy recipes that help with reducing acid reflux, and lowering cholesterol Start using nicotine patches   Progress Notes:  Patient has reported back to work over the last two weeks and have been working 6 days a week to escape being home. Patient stated that she thought that her source of stress were her children, but she realized that she was stressed because of her relationship with her husband and their financial state. Patient have enforced and maintained her boundaries with friends and family, but she is not being received well.  Patient is not understanding why her LDL is 116 when she doesn't eat. Patient stated that she purchased the wrong bacon and have not eaten it. She ate a burger about one-two weeks ago. Patient stated that she will buy cups of fruit for work or other breakfast items and then do not want them, or if she eats her stomach hurts. Patient stated that her acid reflux has worsened. Patient stated that she has not been drinking sodas to help with the reflux as she normally would do. Patient stated that she drunk a cup of coffee and she got heart burn.  Patient is engaging in personal time alone to decompress. She stated that during this time, she likes to watch tv shows like [employer] and [employer].   Patient stated that since she has been back to work that she has begun to smoke less again. Patient stated that she smokes on approximately three cigarettes during her working time. Patient stated that she  notices that she does not smoke the whole cigarette and that she is wasting more cigarettes than she fully smoke. Patient stated that she is having a hard time with smelling husband cigarette smoke and that her smoking is starting to bother her, and she does not like the smell. She has started lighting incenses and spraying herself to get rid of the smell. Patient believes that she is not smoking because of the craving for nicotine, but for the release of stress.   Patient has tried not responding in stressful situations to avoid altercations. Patient continues to practice removing herself from situations that could escalate.      . Indicators of Success and Accountability:  Patient can maintain boundaries in the face of backlash and stressful interactions with family and friends.  . Readiness: Patient is in the action phase of smoking cessation, stress management, and managing HTN. Patient is in the preparation phase of implementing a heart healthy diet to improve acid reflux and lower LDL. . Strengths and Supports: Patient has a close friend that she relies on for encouragement.  . Challenges and Barriers: Stressful interactions with family and coworkers, and finances.  Coaching Outcomes: Patient stated that she has accepted that she must continue to enforce her boundaries to manage her mental health although she gets a lot of push back  from her family members. Patient will continue to monitor how she communicate with others to avoid confrontations. Patient will remove herself from stressful situations when necessary.  Patient will set aside personal time to decompress by watching a favorite tv show  Patient will work on increasing water intake by [employer] forces logistics/support/administrative officer packets to add to her water.   Patient will work with health coach to identify heart healthy diet that will aid in reducing heartburn and lower her LDL. Patient believes that is not healthy to continue to skip meals or drink soda to  deal with heartburn.   Patient will start back placing a nicotine patch on a night for the next day, while waiting for a prescription for Wellbutrin.   Patient will continue to monitor her BP with Vivify and take her HTN meds as prescribed daily. Patient will maintain the elimination of soda from her diet.   Attempted: Marland Kitchen Fulfilled #- Patient is maintaining healthy boundaries with family and friends. Patient is checking BP with Vivify and taking her HTN meds daily as prescribed. Patient has reduced caffeine consumption but not drinking soda.  . Not met #- Patient has not resumed using nicotine patches but plans to resume well waiting on prescription for Wellbutrin. Patient is not implementing a heart healthy diet currently. Patient has not increased water intake.    Referrals: Care Guide will reach out to Dr. Oval Linsey regarding a prescription for Wellbutrin for smoking cessation.

## 2019-12-19 NOTE — Patient Instructions (Signed)
High Cholesterol  Cholesterol Content in Foods Cholesterol is a waxy, fat-like substance that helps to carry fat in the blood. The body needs cholesterol in small amounts, but too much cholesterol can cause damage to the arteries and heart. Most people should eat less than 200 milligrams (mg) of cholesterol a day. Foods with cholesterol  Cholesterol is found in animal-based foods, such as meat, seafood, and dairy. Generally, low-fat dairy and lean meats have less cholesterol than full-fat dairy and fatty meats. The milligrams of cholesterol per serving (mg per serving) of common cholesterol-containing foods are listed below. Meat and other proteins  Egg -- one large whole egg has 186 mg.  Veal shank -- 4 oz has 141 mg.  Lean ground [place] (93% lean) -- 4 oz has 118 mg.  Fat-trimmed lamb loin -- 4 oz has 106 mg.  Lean ground beef (90% lean) -- 4 oz has 100 mg.  Lobster -- 3.5 oz has 90 mg.  Pork loin chops -- 4 oz has 86 mg.  Canned salmon -- 3.5 oz has 83 mg.  Fat-trimmed beef top loin -- 4 oz has 78 mg.  Frankfurter -- 1 frank (3.5 oz) has 77 mg.  Crab -- 3.5 oz has 71 mg.  Roasted chicken without skin, white meat -- 4 oz has 66 mg.  Light bologna -- 2 oz has 45 mg.  Deli-cut [place] -- 2 oz has 31 mg.  Canned tuna -- 3.5 oz has 31 mg.  Tomasa Blase -- 1 oz has 29 mg.  Oysters and mussels (raw) -- 3.5 oz has 25 mg.  Mackerel -- 1 oz has 22 mg.  Trout -- 1 oz has 20 mg.  Pork sausage -- 1 link (1 oz) has 17 mg.  Salmon -- 1 oz has 16 mg.  Tilapia -- 1 oz has 14 mg. Dairy  Soft-serve ice cream --  cup (4 oz) has 103 mg.  Whole-milk yogurt -- 1 cup (8 oz) has 29 mg.  Cheddar cheese -- 1 oz has 28 mg.  American cheese -- 1 oz has 28 mg.  Whole milk -- 1 cup (8 oz) has 23 mg.  2% milk -- 1 cup (8 oz) has 18 mg.  Cream cheese -- 1 tablespoon (Tbsp) has 15 mg.  Cottage cheese --  cup (4 oz) has 14 mg.  Low-fat (1%) milk -- 1 cup (8 oz) has 10 mg.  Sour  cream -- 1 Tbsp has 8.5 mg.  Low-fat yogurt -- 1 cup (8 oz) has 8 mg.  Nonfat Greek yogurt -- 1 cup (8 oz) has 7 mg.  Half-and-half cream -- 1 Tbsp has 5 mg. Fats and oils  Cod liver oil -- 1 tablespoon (Tbsp) has 82 mg.  Butter -- 1 Tbsp has 15 mg.  Lard -- 1 Tbsp has 14 mg.  Bacon grease -- 1 Tbsp has 14 mg.  Mayonnaise -- 1 Tbsp has 5-10 mg.  Margarine -- 1 Tbsp has 3-10 mg. Exact amounts of cholesterol in these foods may vary depending on specific ingredients and brands. Foods without cholesterol Most plant-based foods do not have cholesterol unless you combine them with a food that has cholesterol. Foods without cholesterol include:  Grains and cereals.  Vegetables.  Fruits.  Vegetable oils, such as olive, canola, and sunflower oil.  Legumes, such as peas, beans, and lentils.  Nuts and seeds.  Egg whites. Summary  The body needs cholesterol in small amounts, but too much cholesterol can cause damage to the arteries  and heart.  Most people should eat less than 200 milligrams (mg) of cholesterol a day. This information is not intended to replace advice given to you by your health care provider. Make sure you discuss any questions you have with your health care provider. Document Revised: 12/24/2016 Document Reviewed: 09/07/2016 Elsevier Patient Education  2020 Elsevier Inc.  High cholesterol is a condition in which the blood has high levels of a white, waxy, fat-like substance (cholesterol). The human body needs small amounts of cholesterol. The liver makes all the cholesterol that the body needs. Extra (excess) cholesterol comes from the food that we eat. Cholesterol is carried from the liver by the blood through the blood vessels. If you have high cholesterol, deposits (plaques) may build up on the walls of your blood vessels (arteries). Plaques make the arteries narrower and stiffer. Cholesterol plaques increase your risk for heart attack and stroke. Work with  your health care provider to keep your cholesterol levels in a healthy range. What increases the risk? This condition is more likely to develop in people who:  Eat foods that are high in animal fat (saturated fat) or cholesterol.  Are overweight.  Are not getting enough exercise.  Have a family history of high cholesterol. What are the signs or symptoms? There are no symptoms of this condition. How is this diagnosed? This condition may be diagnosed from the results of a blood test.  If you are older than age 77, your health care provider may check your cholesterol every 4-6 years.  You may be checked more often if you already have high cholesterol or other risk factors for heart disease. The blood test for cholesterol measures:  "Bad" cholesterol (LDL cholesterol). This is the main type of cholesterol that causes heart disease. The desired level for LDL is less than 100.  "Good" cholesterol (HDL cholesterol). This type helps to protect against heart disease by cleaning the arteries and carrying the LDL away. The desired level for HDL is 60 or higher.  Triglycerides. These are fats that the body can store or burn for energy. The desired number for triglycerides is lower than 150.  Total cholesterol. This is a measure of the total amount of cholesterol in your blood, including LDL cholesterol, HDL cholesterol, and triglycerides. A healthy number is less than 200. How is this treated? This condition is treated with diet changes, lifestyle changes, and medicines. Diet changes  This may include eating more whole grains, fruits, vegetables, nuts, and fish.  This may also include cutting back on red meat and foods that have a lot of added sugar. Lifestyle changes  Changes may include getting at least 40 minutes of aerobic exercise 3 times a week. Aerobic exercises include walking, biking, and swimming. Aerobic exercise along with a healthy diet can help you maintain a healthy  weight.  Changes may also include quitting smoking. Medicines  Medicines are usually given if diet and lifestyle changes have failed to reduce your cholesterol to healthy levels.  Your health care provider may prescribe a statin medicine. Statin medicines have been shown to reduce cholesterol, which can reduce the risk of heart disease. Follow these instructions at home: Eating and drinking If told by your health care provider:  Eat chicken (without skin), fish, veal, shellfish, ground [place] breast, and round or loin cuts of red meat.  Do not eat fried foods or fatty meats, such as hot dogs and salami.  Eat plenty of fruits, such as apples.  Eat plenty of  vegetables, such as broccoli, potatoes, and carrots.  Eat beans, peas, and lentils.  Eat grains such as barley, rice, couscous, and bulgur wheat.  Eat pasta without cream sauces.  Use skim or nonfat milk, and eat low-fat or nonfat yogurt and cheeses.  Do not eat or drink whole milk, cream, ice cream, egg yolks, or hard cheeses.  Do not eat stick margarine or tub margarines that contain trans fats (also called partially hydrogenated oils).  Do not eat saturated tropical oils, such as coconut oil and palm oil.  Do not eat cakes, cookies, crackers, or other baked goods that contain trans fats.  General instructions  Exercise as directed by your health care provider. Increase your activity level with activities such as gardening, walking, and taking the stairs.  Take over-the-counter and prescription medicines only as told by your health care provider.  Do not use any products that contain nicotine or tobacco, such as cigarettes and e-cigarettes. If you need help quitting, ask your health care provider.  Keep all follow-up visits as told by your health care provider. This is important. Contact a health care provider if:  You are struggling to maintain a healthy diet or weight.  You need help to start on an exercise  program.  You need help to stop smoking. Get help right away if:  You have chest pain.  You have trouble breathing. This information is not intended to replace advice given to you by your health care provider. Make sure you discuss any questions you have with your health care provider. Document Revised: 01/14/2017 Document Reviewed: 07/12/2015 Elsevier Patient Education  2020 [employer]. Cholesterol Content in Foods Cholesterol is a waxy, fat-like substance that helps to carry fat in the blood. The body needs cholesterol in small amounts, but too much cholesterol can cause damage to the arteries and heart. Most people should eat less than 200 milligrams (mg) of cholesterol a day. Foods with cholesterol  Cholesterol is found in animal-based foods, such as meat, seafood, and dairy. Generally, low-fat dairy and lean meats have less cholesterol than full-fat dairy and fatty meats. The milligrams of cholesterol per serving (mg per serving) of common cholesterol-containing foods are listed below. Meat and other proteins  Egg -- one large whole egg has 186 mg.  Veal shank -- 4 oz has 141 mg.  Lean ground [place] (93% lean) -- 4 oz has 118 mg.  Fat-trimmed lamb loin -- 4 oz has 106 mg.  Lean ground beef (90% lean) -- 4 oz has 100 mg.  Lobster -- 3.5 oz has 90 mg.  Pork loin chops -- 4 oz has 86 mg.  Canned salmon -- 3.5 oz has 83 mg.  Fat-trimmed beef top loin -- 4 oz has 78 mg.  Frankfurter -- 1 frank (3.5 oz) has 77 mg.  Crab -- 3.5 oz has 71 mg.  Roasted chicken without skin, white meat -- 4 oz has 66 mg.  Light bologna -- 2 oz has 45 mg.  Deli-cut [place] -- 2 oz has 31 mg.  Canned tuna -- 3.5 oz has 31 mg.  Tomasa Blase -- 1 oz has 29 mg.  Oysters and mussels (raw) -- 3.5 oz has 25 mg.  Mackerel -- 1 oz has 22 mg.  Trout -- 1 oz has 20 mg.  Pork sausage -- 1 link (1 oz) has 17 mg.  Salmon -- 1 oz has 16 mg.  Tilapia -- 1 oz has 14 mg. Dairy  Soft-serve ice cream  --  cup (4 oz) has 103 mg.  Whole-milk yogurt -- 1 cup (8 oz) has 29 mg.  Cheddar cheese -- 1 oz has 28 mg.  American cheese -- 1 oz has 28 mg.  Whole milk -- 1 cup (8 oz) has 23 mg.  2% milk -- 1 cup (8 oz) has 18 mg.  Cream cheese -- 1 tablespoon (Tbsp) has 15 mg.  Cottage cheese --  cup (4 oz) has 14 mg.  Low-fat (1%) milk -- 1 cup (8 oz) has 10 mg.  Sour cream -- 1 Tbsp has 8.5 mg.  Low-fat yogurt -- 1 cup (8 oz) has 8 mg.  Nonfat Greek yogurt -- 1 cup (8 oz) has 7 mg.  Half-and-half cream -- 1 Tbsp has 5 mg. Fats and oils  Cod liver oil -- 1 tablespoon (Tbsp) has 82 mg.  Butter -- 1 Tbsp has 15 mg.  Lard -- 1 Tbsp has 14 mg.  Bacon grease -- 1 Tbsp has 14 mg.  Mayonnaise -- 1 Tbsp has 5-10 mg.  Margarine -- 1 Tbsp has 3-10 mg. Exact amounts of cholesterol in these foods may vary depending on specific ingredients and brands. Foods without cholesterol Most plant-based foods do not have cholesterol unless you combine them with a food that has cholesterol. Foods without cholesterol include:  Grains and cereals.  Vegetables.  Fruits.  Vegetable oils, such as olive, canola, and sunflower oil.  Legumes, such as peas, beans, and lentils.  Nuts and seeds.  Egg whites. Summary  The body needs cholesterol in small amounts, but too much cholesterol can cause damage to the arteries and heart.  Most people should eat less than 200 milligrams (mg) of cholesterol a day. This information is not intended to replace advice given to you by your health care provider. Make sure you discuss any questions you have with your health care provider. Document Revised: 12/24/2016 Document Reviewed: 09/07/2016 Elsevier Patient Education  2020 [employer].

## 2019-12-24 ENCOUNTER — Ambulatory Visit (INDEPENDENT_AMBULATORY_CARE_PROVIDER_SITE_OTHER): Payer: 59 | Admitting: Cardiovascular Disease

## 2019-12-24 ENCOUNTER — Encounter: Payer: Self-pay | Admitting: Cardiovascular Disease

## 2019-12-24 ENCOUNTER — Other Ambulatory Visit: Payer: Self-pay

## 2019-12-24 VITALS — BP 138/102 | HR 82 | Ht 64.0 in | Wt 174.0 lb

## 2019-12-24 DIAGNOSIS — G4733 Obstructive sleep apnea (adult) (pediatric): Secondary | ICD-10-CM

## 2019-12-24 DIAGNOSIS — I10 Essential (primary) hypertension: Secondary | ICD-10-CM

## 2019-12-24 DIAGNOSIS — R0789 Other chest pain: Secondary | ICD-10-CM | POA: Diagnosis not present

## 2019-12-24 DIAGNOSIS — Z5181 Encounter for therapeutic drug level monitoring: Secondary | ICD-10-CM

## 2019-12-24 DIAGNOSIS — I1A0 Resistant hypertension: Secondary | ICD-10-CM

## 2019-12-24 MED ORDER — BUPROPION HCL ER (SR) 150 MG PO TB12: ORAL_TABLET | ORAL | 3 refills | Status: DC

## 2019-12-24 MED ORDER — SPIRONOLACTONE 50 MG PO TABS: 50.0000 mg | ORAL_TABLET | Freq: Every day | ORAL | 1 refills | Status: DC

## 2019-12-24 NOTE — Patient Instructions (Addendum)
Medication Instructions:  INCREASE YOUR SPIRONOLACTONE TO 50 MG DAILY   START WELLBUTRIN 150 MG  1 TABLET DAILY FOR 3 DAYS AND THEN INCREASE TO TWICE A DAY FOR A TOTAL OF 12 WEEKS ONLY   Labwork: BMET IN 1 WEEK   Testing/Procedures: NONE  Follow-Up: 01/23/2020 AT 2:00 PM WITH PHARM D   Any Other Special Instructions Will Be Listed Below (If Applicable).  CALL [facility] TO SEE ABOUT GETTING AN APPOINTMENT, NUMBER BELOW    Address: [address], [place], [place] [address] Hours:  Open ? Closes 5PM Phone: [phone]  If you need a refill on your cardiac medications before your next appointment, please call your pharmacy.

## 2019-12-24 NOTE — Progress Notes (Signed)
Cardiology Office Note   Date:  12/24/2019   ID:  Denise Carter, Denise Carter March 30, 1970, MRN [id number]  PCP:  Arvilla Market, DO  Cardiologist:   Chilton Si, MD   No chief complaint on file.    History of Present Illness: Denise Carter is a 49 y.o. female with hypertension, OSA on CPAP, SLE, rheumatoid arthritis, polyclonal gammopathy, Sjogren's disease, and tobacco abuse here for follow-up.  She first found out she had hypertension in 2015 when she was diagnosed with SLE.  Her BP hasn't ever been well-controlled.  She previously saw Dr. Eden Emms 12/2017 for hypertension.  Her blood pressure at that time was around 150 systolic.  She was referred for renal artery Dopplers that were normal.  Labetalol was increased to 300 mg twice daily.  She was seen in the ED 12/2018 with headache and sinus congestion.  Her blood pressure at that time was 192/131.  She works at [employer] and walks a lot at work.  She has random episodes of chest pain and diaphoresis.  The pain is on her R side and under her R breast.  It starts sharp and then subsides.  It is sometimes associated with heart racing.  It seems to occur mostly at rest and regularly with exertion.  She does not get any formal exercise outside of work.  She recently started noticing shortness of breath when walking.  She wonders if it could be related to being overweight or her smoking.  She continues to smoke 1 pack of cigarettes daily.  In the past she tried quitting with Chantix, gum, and she thinks she used Wellbutrin but is willing to try again.  At her appointment  there was concern for pheochromocytoma.  Plasma catecholamines and metanephrines were negative.  Ms. Skeet has been under a lot of stress because of her son.  He is still living in [place] and gets into a lot of trouble with the law.  She was having some chest pain he was referred for she had shortness coronary CT-A 05/2019 that revealed normal coronary arteries.  She had  shortness of breath and had an echo that revealed LVEF 60 to 65% with grade 2 diastolic dysfunction.  Right atrial pressure was 3 mmHg.  Renin and aldosterone levels were normal for hyperaldosteronism but were concerning for Cushing syndrome.  She was referred to endocrinology and they did not feel her labs were consistent with this.  She was referred for sleep study and was found to have sleep apnea.  CPAP was recommended.  She is undergone multiple medication changes and been working with our pharmacist.  Valsartan were switched to irbesartan.  She was referred to prep for exercise but unable to participate due to her work schedule.  She last our pharmacist on 09/2019.  At that time her blood pressure was 170/112.  Spironolactone was increased to 50 mg.  However she did not understand this change and has only been taking 25 mg.  Renal function and potassium remained stable with this change.  At that pharmacy appointment she noted facial droop and was referred for CT of the head that was normal.  She is trying to cut back on smoking and reduce stress.  Her arthritis medication was changed and her symptoms haven't been well-controlled.  She notes that her BP goes up when she is stressed.  Her lifting at work aggravates her joints and they get hot and swollen at the end of the day.  She has a  poor appetite and isn't eating much.  She doesn't have much salt in her diet.  She notes pain in her legs walking at work.   At her last appointment hydralazine was added to her regimen.  Since then she called our office and BP remained high, so spironolactone was increased to 50mg  on 12/13/19.  However she did not understand and has not made that change.  She has been under a lot of stress.  Her knees hurt and she has to do a lot of lifting at work.  Her husband drinks a lot and isn't helpful.  She has adult children.  One is incarcerated and she tries to help with his kids.  Her BP has been better but her diastolic is still  high.  She likes irbesartan and spironolactone.  She stopped hydralazine due headaches.  She isn't getting any exercise outside of work  She has been sleeping a lot.  She isn't eating much and wonders why her lipids are high.  She is ready to quit smoking.   Previous antihypertensives: Lisinopril HCTZ Verapamil Amlodipine - headache labetalolol- didn't work Atenolol worked some in 12/15/19.  Past Medical History:  Diagnosis Date  . Atypical chest pain 05/23/2019  . Essential hypertension 05/23/2019  . Hypertension   . Lupus (HCC)   . OSA (obstructive sleep apnea) 11/19/2019  . Shortness of breath 05/23/2019  . Snoring 05/23/2019  . Thyroid disease     Past Surgical History:  Procedure Laterality Date  . ABDOMINAL HYSTERECTOMY       Current Outpatient Medications  Medication Sig Dispense Refill  . amLODipine (NORVASC) 5 MG tablet Take 1 tablet (5 mg total) by mouth daily. 90 tablet 1  . aspirin (EC-81 ASPIRIN) 81 MG EC tablet Take 1 tablet (81 mg total) by mouth daily. Swallow whole. 30 tablet 12  . atorvastatin (LIPITOR) 40 MG tablet Take 1 tablet (40 mg total) by mouth daily. 90 tablet 3  . cetirizine (ZYRTEC) 10 MG tablet Take 1 tablet (10 mg total) by mouth daily. (Patient taking differently: Take 10 mg by mouth as needed. ) 30 tablet 11  . hydroxychloroquine (PLAQUENIL) 200 MG tablet Take 1 tablet by mouth 2 (two) times a day.    . irbesartan (AVAPRO) 300 MG tablet Take 1 tablet (300 mg total) by mouth daily. 90 tablet 3  . pregabalin (LYRICA) 150 MG capsule Take 1 capsule (150 mg total) by mouth 2 (two) times daily. 180 capsule 1  . RINVOQ 15 MG TB24 Take 1 tablet by mouth daily.    05/25/2019 buPROPion (WELLBUTRIN SR) 150 MG 12 hr tablet TAKE 1 TABLET DAILY FOR 3 DAYS AND THEN INCREASE TO TWICE A DAY 60 tablet 3  . spironolactone (ALDACTONE) 50 MG tablet Take 1 tablet (50 mg total) by mouth daily. 90 tablet 1   No current facility-administered medications for this visit.    Allergies:    Labetalol    Social History:  The patient  reports that she has been smoking. She has been smoking about 1.00 pack per day. She has never used smokeless tobacco. She reports that she does not drink alcohol and does not use drugs.   Family History:  The patient's family history includes CAD in her maternal grandmother; Heart failure in her maternal grandmother; Hypertension in her maternal grandmother and mother; Lupus in her maternal grandmother; Stroke in her maternal grandmother.    ROS:  Please see the history of present illness.   Otherwise, review of systems  are positive for none.   All other systems are reviewed and negative.    PHYSICAL EXAM: VS:  BP (!) 138/102   Pulse 82   Ht  (1.626 m)   Wt 174 lb (78.9 kg)   SpO2 99%   BMI 29.87 kg/m  , BMI Body mass index is 29.87 kg/m. GENERAL:  Well appearing HEENT: Pupils equal round and reactive, fundi not visualized, oral mucosa unremarkable NECK:  No jugular venous distention, waveform within normal limits, carotid upstroke brisk and symmetric, no bruits LUNGS:  Clear to auscultation bilaterally HEART:  RRR.  PMI not displaced or sustained,S1 and S2 within normal limits, no S3, no S4, no clicks, no rubs, no murmurs ABD:  Flat, positive bowel sounds normal in frequency in pitch, no bruits, no rebound, no guarding, no midline pulsatile mass, no hepatomegaly, no splenomegaly EXT:  1 plus pulses throughout, no edema, no cyanosis no clubbing SKIN:  No rashes no nodules NEURO:  Cranial nerves II through XII grossly intact, motor grossly intact throughout PSYCH:  Cognitively intact, oriented to person place and time  EKG:  EKG is not ordered today. The ekg ordered today demonstrates sinus rhythm.  Rate 82 bpm.  Nonspecific ST changes.  LAFB.  Cannot rule out anteroseptal infarct   Echo 05/2019: 1. Left ventricular ejection fraction, by estimation, is 60 to 65%. The  left ventricle has normal function. The left ventricle has no  regional  wall motion abnormalities. There is mild concentric left ventricular  hypertrophy. Left ventricular diastolic  parameters are consistent with Grade II diastolic dysfunction  (pseudonormalization). Elevated left ventricular end-diastolic pressure.  2. Right ventricular systolic function is normal. The right ventricular  size is normal. There is normal pulmonary artery systolic pressure.  3. Left atrial size was mildly dilated.  4. The mitral valve is normal in structure. Trivial mitral valve  regurgitation. No evidence of mitral stenosis.  5. The aortic valve is tricuspid. Aortic valve regurgitation is not  visualized. No aortic stenosis is present.  6. Aortic dilatation noted. There is mild dilatation of the ascending  aorta measuring 38 mm.  7. The inferior vena cava is normal in size with greater than 50%  respiratory variability, suggesting right atrial pressure of 3 mmHg.   Recent Labs: 07/23/2019: TSH 0.97 10/18/2019: BUN 10; Creatinine, Ser 0.78; Potassium 4.0; Sodium 141 12/05/2019: Hemoglobin 13.0; Platelets 167    Lipid Panel    Component Value Date/Time   CHOL 196 12/05/2019 1119   TRIG 65 12/05/2019 1119   HDL 68 12/05/2019 1119   CHOLHDL 2.9 12/05/2019 1119   LDLCALC 116 (H) 12/05/2019 1119      Wt Readings from Last 3 Encounters:  12/24/19 174 lb (78.9 kg)  12/05/19 173 lb (78.5 kg)  11/19/19 176 lb (79.8 kg)      ASSESSMENT AND PLAN:  # Essential hypertension:  Blood pressure has been very difficult to control.  She has been participating in our remote patient monitor program and is checking her blood pressures regularly.  They are consistently elevated.  She especially has high diastolic pressures.  She didn't tolerate hydralazine due to headaches.  We will take hydralazine off her list and increase spironolactone to 50 mg.  She was previously instructed to increase spironolactone to 50 mg but did not make that change.  Check BMP in a week.   Continue amlodipine and irbesartan.  Her stress and anxiety definitely contribute to her poorly controlled hypertension her pain from rheumatoid arthritis  also contributes.  She plans to find a new rheumatologist.   Secondary Causes of Hypertension  Medications/Herbal: OCP, steroids, stimulants, antidepressants, weight loss medication, immune suppressants, NSAIDs, sympathomimetics, alcohol, caffeine, licorice, ginseng, St. John's wort, chemo  Sleep Apnea: CPAP recommended Renal artery stenosis: Renal artery Dopplers - 12/2017 Hyperaldosteronism:Negative 07/2019 Hyper/hypothyroidism: Normal 06/2019 Pheochromocytoma: - 06/2019 Cushing's syndrome: Testing not indicated Coarctation of the aorta: Blood pressure symmetric  # Chest pain:  CT-A negative 09/2019.  Calcium score was 0.  # Shortness of breath:  Echo revealed diastolic dysfunction but normal systolic function.  Right atrial pressure was 3.  I suspect her diastolic dysfunction is attributable to her poorly controlled hypertension.  Coronary CT-A was negative for any coronary disease.  Continue with blood pressure control as above and working on smoking cessation.  Use CPAP for sleep apnea.  # Tobacco abuse: She is interested in quitting smoking.  She will start Wellbutrin 150 mg daily for 3 days followed by 150 mg twice daily for 12 weeks.  She was congratulated on her desire to quit smoking.  # Leg pain: ABIs were normal11/2021.  This is attributable to her back disease.    Current medicines are reviewed at length with the patient today.  The patient does not have concerns regarding medicines.  The following changes have been made: Add hydralazine Labs/ tests ordered today include:   Orders Placed This Encounter  Procedures  . Basic metabolic panel     Disposition:   FU with Temprance Wyre C. Duke Salvia, MD, [facility] in 1 month    Signed, Treyden Hakim C. Duke Salvia, MD, [facility]  12/24/2019 12:40 PM    [facility]

## 2019-12-25 ENCOUNTER — Ambulatory Visit (INDEPENDENT_AMBULATORY_CARE_PROVIDER_SITE_OTHER): Payer: 59 | Admitting: Psychologist

## 2019-12-25 DIAGNOSIS — F411 Generalized anxiety disorder: Secondary | ICD-10-CM | POA: Diagnosis not present

## 2020-01-02 ENCOUNTER — Ambulatory Visit (INDEPENDENT_AMBULATORY_CARE_PROVIDER_SITE_OTHER): Payer: 59

## 2020-01-02 ENCOUNTER — Other Ambulatory Visit: Payer: Self-pay

## 2020-01-02 ENCOUNTER — Telehealth: Payer: Self-pay | Admitting: Licensed Clinical Social Worker

## 2020-01-02 DIAGNOSIS — Z0000 Encounter for general adult medical examination without abnormal findings: Secondary | ICD-10-CM

## 2020-01-02 NOTE — Telephone Encounter (Signed)
CSW made aware via Amy, pt Health Coach, that pt has received eviction notice and has some confusion around if she has been approved or not for CAREs program.   CSW reached out to [facility] for [facility] and [employer] eviction mediation and rental assistance supports to see if they can contact pt for any additional assistance.   Octavio Graves, MSW, LCSW [facility] Heart/Vascular Care Navigation  [phone]

## 2020-01-02 NOTE — Progress Notes (Signed)
Appointment Outcome:  Completed, Session #: 11  AGREEMENTS SECTION   Overall Goal(s): Smoking Cessation Stress Management Lower BP                                              Improve diet   Agreement/Action Steps:  Monitor blood pressure with [employer] daily Take HTN medications as prescribed daily Take Wellbutrin as prescribed Decrease caffeine consumption Maintain healthy boundaries Increase water consumption Research healthy foods  Progress Notes:  Patient reported that her smoking has decreased since she has been taking Wellbutrin. Patient stated that she typically smokes between 4-5 cigarettes per day. Yesterday she smoked approximately 7 because she was in a lot of pain. Patient mentioned that she really doesn't have an urge to smoke on good days but have been spacing the cigarette smoking out when she does have cravings. Sometimes, the patient does not have the taste for a cigarette, which has helped reduce her smoking. Patient stated that the smell sometimes deters her from smoking as well. Patient reported that she has not used the nicotine patches in the past two weeks because she has been taking the Wellbutrin. Patient stated that she has not purchased a pack of cigarettes since the last one. Patient was taking cigarettes from husband's pack.   Patient reported that she does have trouble sleeping at night and it's contributed to the pain she is experiencing. Patient is out of a medication that manages her pain and helps her sleep at night. Patient stated because of the pain, she mostly stays in bed. She has not worked in a few days as a result. Patent mentioned that being in pain is the main reason why she has not checked her blood pressure on a regular basis. Patient also mentioned that she gets frustrated with checking her blood pressure because it's always high and she sometimes do not want to deal with it.   Patient has reduced caffeine consumption by switching to  caffeine-free beverages and reducing her coffee consumption. Patient has attempted to drink ice water but had an episode where she regurgitated and cannot tell if it was contributed to the water or acid reflux. Patient has not been eating well either. She stated that recently she cooked her some fries and ended up with reflux. Her reasoning for eating the fries was because it was quick to fix. Patient does not know what she can really eat to avoid having this happen. Patient also mentioned that she has to be in the mood to eat certain foods and she typically gets hungry around 11am -12pm.  Patient has been able to maintain her healthy boundaries with family and friends. Patient stated that her approach to dealing with how they perceive her for setting boundaries has changed. She no longer allows their negative comments to affect her being assertive and enforcing these boundaries. Patient has recognized triggers and have consciously decided that she will ignore particular situations and not allow herself to take on other people issues as her own. She stated that she does not allow others to stress her to the point where she gets frustrated or upset.   Patient stated that she is still having financial issues and that is the main stressors. Patient has not heard back from Social Services regarding rental assistance and is concerned about eviction.   Patient has started therapy as well.    Marland Kitchen  Indicators of Success and Accountability:  Patient stated that she is finally focusing on herself, which she has never done before, and it makes her feel better. Patient has worked on not Music therapist.  . Readiness: Patient is in maintenance phase of stress management. Patient is in preparation stage of improving diet and in the action phase of smoking cessation and lowering blood pressure.   . Strengths and Supports: Patient close friends continues to be an Counselling psychologist. Patient has been focused on self and  goals. Patient has been advocating for her health and has become motivated to achieve her goals.  . Challenges and Barriers: Patient mentioned that pain would be her main challenge to smoking cessation. Stress may also be a factor in achieving smoking cessation.  Coaching Outcomes: Patient stated that she would like to take it day by day with the range of how many cigarettes she is smoking daily. Patient currently smokes between 4-7 cigarettes per day depending on her pain level. Patient stated that she will not buy another pack of cigarettes. Patient will not take husband cigarettes. Patient will space out remaining cigarettes. Patient will continue to take Wellbutrin as prescribed. Patient will not use nicotine patches.   Patient will work on pain management with her Rheumatologist.   Patient will work with [facility] to research healthier food choices to aid in reducing acid reflux, reducing sodium consumption, and lowering cholesterol. Patient will continue to drink sodas without caffeine and limit how much coffee she drinks daily.   Patient will continue to maintain healthy boundaries with family and friends. Patient's overall perspective has changed towards how she looks at her stressors and how she reacts to them. Patient will continue to recognize triggers and process her stressors to help her reduce internalizing stress.   Patient will start back monitoring her blood pressure in the mornings. Patient will continue to take HTN medications as prescribed.   Patient will start attempting to drink more water if she can manage to keep it down.   Patient will attend therapy as scheduled.   Attempted: Marland Kitchen Fulfilled - Patient is maintaining healthy boundaries, reduced caffeine consumption, take HTN medications daily.  . Partial - Patient attempted to drink water. Patient monitored her blood pressure occasionally.  . Not met - Patient has not implemented a healthy diet due to lack of knowledge on  what to eat with acid reflux and not having an appetite.    Not attempted: Marland Kitchen Dropped/Revised #- Patient decided not to use nicotine patches since she is taking Wellbutrin.   Referrals: Will work with LCSW to investigate the rental assistance through Social Service the patient applied for.

## 2020-01-03 ENCOUNTER — Telehealth: Payer: Self-pay | Admitting: Licensed Clinical Social Worker

## 2020-01-03 NOTE — Telephone Encounter (Signed)
CSW received call back from [facility] for Housing and [employer] liaison Stefanie Lake Tapps. She is aware that pt had applied for CARES funding and received an eviction notice. She will f/u with CARES program lead about status of application. She was provided w/ pt contact number to also discuss any support around eviction mediation.   Octavio Graves, MSW, LCSW [facility] Heart/Vascular Care Navigation  [phone]

## 2020-01-09 ENCOUNTER — Ambulatory Visit (INDEPENDENT_AMBULATORY_CARE_PROVIDER_SITE_OTHER): Payer: 59 | Admitting: Psychologist

## 2020-01-09 DIAGNOSIS — F411 Generalized anxiety disorder: Secondary | ICD-10-CM

## 2020-01-14 ENCOUNTER — Telehealth: Payer: Self-pay

## 2020-01-14 NOTE — Telephone Encounter (Signed)
Pt needs a vain and vascular

## 2020-01-14 NOTE — Telephone Encounter (Signed)
Pt called saying that she needs a referral to the vain and vascular.

## 2020-01-15 NOTE — Telephone Encounter (Signed)
Patient has also reached out to cardiology regarding these concerns from review of other mychart messages. Will defer as they have been working this up.   Marcy Siren, D.O. Primary Care at [facility]  01/15/2020, 9:51 AM

## 2020-01-15 NOTE — Telephone Encounter (Signed)
After doing chart review it looks like patient had lower arterial studies done due to bilateral lower extremity pain & intermittent claudication.  Studies were normal but patient is having continued leg pain & swelling.

## 2020-01-16 ENCOUNTER — Ambulatory Visit (INDEPENDENT_AMBULATORY_CARE_PROVIDER_SITE_OTHER): Payer: 59

## 2020-01-16 ENCOUNTER — Other Ambulatory Visit: Payer: Self-pay

## 2020-01-16 DIAGNOSIS — Z0000 Encounter for general adult medical examination without abnormal findings: Secondary | ICD-10-CM

## 2020-01-16 NOTE — Progress Notes (Signed)
Appointment Outcome:  Completed, Session #: 12  AGREEMENTS SECTION   Overall Goal(s): Stress management Improve healthy eating behaviors Smoking Cessation Mange Hypertension  Agreement/Action Steps:  Not buying any cigarettes Take Wellbutrin as prescribed Maintain healthy boundaries Attend therapy as scheduled Research healthy recipes Decrease caffeine Increase water consumption Check BP daily Take HTN meds daily as prescribed  Progress Notes:  Patient stated that she has been taking Wellbutrin as prescribed and it is not helping with smoking cessation but has improved her mood. Patient mentioned that sometimes smoking or smelling smoke makes her sick on the stomach. Patient admitted that she purchased a pack of cigarettes yesterday. Patient stated that she doesn't smoke while working so that helps with reducing how much she smokes.   Patient feels that she may smoke a pack of cigarettes today because she is under stress due to her concerns around a diagnosis of Lymphedema. Patient reported that she needed guidance with navigating care. Patient stated that she was informed that exercising will help with the condition. Patient stated that she doesn't understand how she can exercise/walk outside of work because her knees and legs are swollen.   Patient informed Care Guide that she is attending therapy as scheduled. Patient shared circumstances around Christmas, finances, and family that has caused some stress, but she was able to deal with it by maintaining her boundaries in a healthy and respectful manner.   Patient stated that she has been extremely thirsty recently and has increased her water intake but is not sure how much she is drinking because she is drinking from a water dispenser throughout the day. Patient stated that she only has drunk part of a 16 oz. Coca Cola when she had heart burn. Patient has reduced her caffeine consumption by not buying coffee for home. However, she did  drink some at work to help with pain. Patient has gone all day without any caffeine, but experience headaches. She reported consuming apple cider vinegar to help with the headaches.  Patient has resumed checking her BP. Patient is taking HTN meds as prescribed. Patient stated that when she is not in pain that is when her BP is lower.    . Indicators of Success and Accountability:  Patient is drinking more water than she previously was. Patient has resumed checking BP. Marland Kitchen Readiness: Patient is in the maintenance phase of taking her medications as prescribed and enforcing healthy boundaries. Patient is in the action phase of increasing water consumption and decreasing caffeine, checking BP daily, improving diet, and attending therapy.  . Strengths and Supports: Patient is motivated and has self-awareness. Patient is being supported by a close friend.  . Challenges and Barriers: Stress and Finances     Coaching Outcomes: Patient recognizes that she has made some changes over the past two weeks that she has not been able to do previously, such as increase water intake and decrease caffeine. Patient started checking BP again because she understands its importance in managing her BP. Patient is activated in managing her health conditions through implementing these action steps. Patient will work on improving her diet by focusing on what is causing her indigestion.   Patient will continue to take Wellbutrin for her mood but will try using the remaining nicotine patches she has to aid in smoking cessation. Patient will aim to not purchase another pack of cigarettes over the next two weeks. Patient is recognizing the changes that has occurred during her journey to smoking cessation, such as getting sick  from the smoke, which is a motivation to quit.   Patient recognizes that maintaining her healthy boundaries with family and friends, in addition to being mindful of what circumstances she entertains has helped  her tremendously in managing stress. She is aware that she has to be careful not to allow her finances and family stressors to interfere with the positive thoughts she has about herself now that she's made such progress.   Patient has also reduced hours at work to help with pain and stress management.   Patient wants to focus on understanding Lymphedema and how it will affect her. Patient will follow up with doctors.    Attempted: Marland Kitchen Fulfilled #- Patient has been able to increase her water intake and decrease caffeine consumption. Patient has been able to maintain her healthy boundaries with family and friends. Patient is taking Wellbutrin and HTN medications as prescribed and is attending therapy as scheduled.  . Partial #- Patient has managed to check her BP some days in the past two weeks.  . Not met #- Patient did not meet the agreement to not purchase another pack of cigarettes in the previous two weeks. Patient has not researched or implemented healthier foods.   Referrals: Care Guide will reach out to Dr. Oval Linsey with patient's concern with needing a vascular doctor to address Lymphedema.

## 2020-01-22 ENCOUNTER — Ambulatory Visit: Payer: 59 | Admitting: Endocrinology

## 2020-01-23 ENCOUNTER — Ambulatory Visit (INDEPENDENT_AMBULATORY_CARE_PROVIDER_SITE_OTHER): Payer: 59 | Admitting: Pharmacist Clinician (PhC)/ Clinical Pharmacy Specialist

## 2020-01-23 ENCOUNTER — Other Ambulatory Visit: Payer: Self-pay

## 2020-01-23 DIAGNOSIS — I10 Essential (primary) hypertension: Secondary | ICD-10-CM | POA: Diagnosis not present

## 2020-01-23 MED ORDER — CHLORTHALIDONE 25 MG PO TABS: 12.5000 mg | ORAL_TABLET | Freq: Every day | ORAL | 3 refills | Status: DC

## 2020-01-23 NOTE — Assessment & Plan Note (Signed)
Patient with uncontrolled hypertension.  She states compliant, but this does not match pharmacy records.  Stressed the concerns for developing heart failure with continued uncontrolled pressure.  Asked that she bring all meds to her next visit.  Will need to add chlorthalidone, as she is quite elevated.  Will start with 12. 5mg  once daily and hopefully she will fill the prescription and start.  We will see her back in one month for follow up. She will get labs drawn today, as she did purchase the spironolactone 50 mg last month.

## 2020-01-23 NOTE — Patient Instructions (Signed)
Return for a a follow up appointment January 26 at 9:30  Go to the lab today to check kidney function  Check your blood pressure at home daily and keep record of the readings.  Take your BP meds as follows:  Start chlorthalidone 12.5 mg once daily (cut the 25 mg tablets in half)  Continue irbesartan, amlodipine and spironolactone  Bring all of your meds, your BP cuff and your record of home blood pressures to your next appointment.  Exercise as you're able, try to walk approximately 30 minutes per day.  Keep salt intake to a minimum, especially watch canned and prepared boxed foods.  Eat more fresh fruits and vegetables and fewer canned items.  Avoid eating in fast food restaurants.    HOW TO TAKE YOUR BLOOD PRESSURE: . Rest 5 minutes before taking your blood pressure. .  Don't smoke or drink caffeinated beverages for at least 30 minutes before. . Take your blood pressure before (not after) you eat. . Sit comfortably with your back supported and both feet on the floor (don't cross your legs). . Elevate your arm to heart level on a table or a desk. . Use the proper sized cuff. It should fit smoothly and snugly around your bare upper arm. There should be enough room to slip a fingertip under the cuff. The bottom edge of the cuff should be 1 inch above the crease of the elbow. . Ideally, take 3 measurements at one sitting and record the average.

## 2020-01-23 NOTE — Progress Notes (Signed)
HPI:  Denise Carter is a 49 y.o. female patient of Dr Duke Salvia, who presents today for advanced hypertension clinic follow up.  Her past medical history is significant only for non-cardiac issues including RA, Sjogren's disease and SLE.  Previously she had problems with several medications (see below), but much of it sounds to be related to just lack of effective combinations/doses. Dr. Duke Salvia set her up to see our Care Guide, enrolled her in [facility] exercise program and did blood work to check metanephrines, renin/aldosterone and hypokalemia.  She was given a blood pressure cuff and registered with [facility]. Patient also completed her sleep study was recommended for CPAP by Dr. Mayford Knife. Valsartan was changed to irbesartan, then increased to 300mg  daily on 08/22/2019 after review of [facility] report.  She has also been working with our Care Guide to decrease stressors and help with smoking cessation.    She was last seen by Dr. 08/24/2019 on November 29, with a pressure of 138/102.  Dr. 11-16-1989 increased her spironolactone to 50 mg and gave her a prescription for bupropion for smoking cessation.  She was to have labs drawn a week after the increase in spironolactone, but has not yet had that done.  Today she notes that she continues to have headaches, but has not stopped the hydralazine.    I had my CMA reach out to [facility] for verification of fill dates on her medications.  Per pharmacy, last amlodipine 5 mg was filled in August for 90 day supply.  Was filled again in November, but returned to stock when not picked up.  Irbesartan last filled August as well, just 30 day supply, not filled since then per their records.  She did pick up a 90 day supply of spironolactone after seeing Dr. September at the end of November.  When asked about the irbesartan fill, she assured me that she has medication at home and has not missed doses.  Suggested that the pharmacy may have multiple prescriptions on file.  I  asked her to bring all meds to her next visit, so we can verify that she has all.  States no problems with cost or ability to get to pharmacy.      Secondary cause testing: Pheochromocytoma 4/21:Metanephrines WNL  hypoaldosteronism 4/21:Renin low (<0.167), aldosterone WNL, aldost/renin ratio WNL  Sleep apnea 8/21: OSA , CPAP titration recommended  Renovascular disease 12/19: ultrasound WNL     Blood Pressure Goal:  130/80  Current Medications: Irbesartan 300mg  qd, amlodipine 5 mg qd - pm, spironolactone 25 mg   Family Hx: mother died at 70 cervical caner, mgp both with hypertension; father died at 5 due to drug issues; no whole sibblings; 7 chidren oldest 85 - no known issues for any at this time  Social Hx: still 1/2+  pack per day - is trying to quit - on bupropion, but it does not appear to be cutting back her cravings; no alcohol; caffeine daily (multiple cups of coffee and Coke throughout the day)  Diet: low appetite due to meds, notes lost appetite after d/c prednisone about 8-9 months ago (d/c from 20 mg to 10 mg the d/c) - mostly coffee diet right now - however hasn't lost any weight, tried decaf but back on regular  Exercise: [facility] doesn't have any evening schedules, confilcts with work (5 am-10-12 pm at 56) - unable to due to work and family struggles  Home BP readings: 3 readings in past 2 weeks average  163/119.  We have turned off the [facility] app and asked that she write readings down on a log sheet from now on.  Intolerances: Labetalol - HA HCTZ - HA Valsartan - diarrhea Amlodipine causes headaches at 10 mg Hydralazine - headaches  Labs:  8/21:  Na 141, K 4.4, Glu 69, BUN 8, SCR 0.76   Wt Readings from Last 3 Encounters:  01/23/20 175 lb (79.4 kg)  12/24/19 174 lb (78.9 kg)  12/05/19 173 lb (78.5 kg)   BP Readings from Last 3 Encounters:  01/23/20 (!) 170/110  12/24/19 (!) 138/102  12/05/19 (!) 187/132   Pulse Readings from Last 3 Encounters:   01/23/20 94  12/24/19 82  12/05/19 79    Current Outpatient Medications  Medication Sig Dispense Refill  . amLODipine (NORVASC) 5 MG tablet Take 1 tablet (5 mg total) by mouth daily. 90 tablet 1  . aspirin (EC-81 ASPIRIN) 81 MG EC tablet Take 1 tablet (81 mg total) by mouth daily. Swallow whole. 30 tablet 12  . atorvastatin (LIPITOR) 40 MG tablet Take 1 tablet (40 mg total) by mouth daily. 90 tablet 3  . buPROPion (WELLBUTRIN SR) 150 MG 12 hr tablet TAKE 1 TABLET DAILY FOR 3 DAYS AND THEN INCREASE TO TWICE A DAY 60 tablet 3  . chlorthalidone (HYGROTON) 25 MG tablet Take 0.5 tablets (12.5 mg total) by mouth daily. 90 tablet 3  . hydroxychloroquine (PLAQUENIL) 200 MG tablet Take 1 tablet by mouth 2 (two) times a day.    . irbesartan (AVAPRO) 300 MG tablet Take 1 tablet (300 mg total) by mouth daily. 90 tablet 3  . pregabalin (LYRICA) 150 MG capsule Take 1 capsule (150 mg total) by mouth 2 (two) times daily. 180 capsule 1  . RINVOQ 15 MG TB24 Take 1 tablet by mouth daily.    Marland Kitchen spironolactone (ALDACTONE) 50 MG tablet Take 1 tablet (50 mg total) by mouth daily. 90 tablet 1   No current facility-administered medications for this visit.    Allergies  Allergen Reactions  . Labetalol     Headaches    Past Medical History:  Diagnosis Date  . Atypical chest pain 05/23/2019  . Essential hypertension 05/23/2019  . Hypertension   . Lupus (HCC)   . OSA (obstructive sleep apnea) 11/19/2019  . Shortness of breath 05/23/2019  . Snoring 05/23/2019  . Thyroid disease     Blood pressure (!) 170/110, pulse 94, resp. rate 16, height 5\' 4"  (1.626 m), weight 175 lb (79.4 kg), SpO2 98 %.  Resistant hypertension Patient with uncontrolled hypertension.  She states compliant, but this does not match pharmacy records.  Stressed the concerns for developing heart failure with continued uncontrolled pressure.  Asked that she bring all meds to her next visit.  Will need to add chlorthalidone, as she is quite  elevated.  Will start with 12. 5mg  once daily and hopefully she will fill the prescription and start.  We will see her back in one month for follow up. She will get labs drawn today, as she did purchase the spironolactone 50 mg last month.   PharmD CPP [facility] [address] [place] [address] 01/23/2020 2:40 PM

## 2020-01-24 ENCOUNTER — Ambulatory Visit: Payer: 59 | Admitting: Podiatry

## 2020-01-24 LAB — BASIC METABOLIC PANEL
BUN/Creatinine Ratio: 8 — ABNORMAL LOW (ref 9–23)
BUN: 7 mg/dL (ref 6–24)
CO2: 25 mmol/L (ref 20–29)
Calcium: 8.9 mg/dL (ref 8.7–10.2)
Chloride: 103 mmol/L (ref 96–106)
Creatinine, Ser: 0.89 mg/dL (ref 0.57–1.00)
GFR calc Af Amer: 87 mL/min/{1.73_m2} (ref >59)
GFR calc non Af Amer: 76 mL/min/{1.73_m2} (ref >59)
Glucose: 83 mg/dL (ref 65–99)
Potassium: 4.1 mmol/L (ref 3.5–5.2)
Sodium: 140 mmol/L (ref 134–144)

## 2020-01-25 ENCOUNTER — Ambulatory Visit: Payer: Self-pay

## 2020-01-26 DIAGNOSIS — I8392 Asymptomatic varicose veins of left lower extremity: Secondary | ICD-10-CM

## 2020-01-26 HISTORY — DX: Asymptomatic varicose veins of left lower extremity: I83.92

## 2020-01-30 ENCOUNTER — Ambulatory Visit: Payer: 59

## 2020-01-30 ENCOUNTER — Telehealth: Payer: Self-pay

## 2020-01-30 DIAGNOSIS — Z0000 Encounter for general adult medical examination without abnormal findings: Secondary | ICD-10-CM

## 2020-01-30 NOTE — Telephone Encounter (Signed)
Called patient to reschedule appointment because of her exposure to Covid-19. Patient is awaiting test results. Patient had called to cancel appointment on 01/29/20 but was told she could keep her face-to-face appointment. For safety precautions, the patient's health coaching session has been rescheduled for 02/04/20 per Care Guide.

## 2020-02-01 ENCOUNTER — Telehealth: Payer: 59 | Admitting: Neurology

## 2020-02-01 ENCOUNTER — Encounter: Payer: Self-pay | Admitting: Neurology

## 2020-02-01 ENCOUNTER — Other Ambulatory Visit: Payer: Self-pay

## 2020-02-01 DIAGNOSIS — Z029 Encounter for administrative examinations, unspecified: Secondary | ICD-10-CM

## 2020-02-04 ENCOUNTER — Ambulatory Visit (INDEPENDENT_AMBULATORY_CARE_PROVIDER_SITE_OTHER): Payer: 59

## 2020-02-04 ENCOUNTER — Other Ambulatory Visit: Payer: Self-pay

## 2020-02-04 ENCOUNTER — Telehealth: Payer: Self-pay

## 2020-02-04 ENCOUNTER — Telehealth: Payer: Self-pay | Admitting: Internal Medicine

## 2020-02-04 DIAGNOSIS — Z0000 Encounter for general adult medical examination without abnormal findings: Secondary | ICD-10-CM

## 2020-02-04 DIAGNOSIS — E669 Obesity, unspecified: Secondary | ICD-10-CM | POA: Insufficient documentation

## 2020-02-04 DIAGNOSIS — G8929 Other chronic pain: Secondary | ICD-10-CM | POA: Insufficient documentation

## 2020-02-04 NOTE — Telephone Encounter (Signed)
Please see MyChart message from today. Patient stated she tested positive for COVID and would like to be written out of work. Advised patient that provider is out of the office until Wednesday. Please follow up.

## 2020-02-04 NOTE — Telephone Encounter (Signed)
No record of test in [facility] system, looks like may have been tested elsewhere, pls advise.

## 2020-02-04 NOTE — Telephone Encounter (Signed)
Called patient to get update on Covid-19 symptoms and new test results prior to today's appointment. Left message for patient to return call to Care Guide at [phone].
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
## 2020-02-06 ENCOUNTER — Ambulatory Visit (INDEPENDENT_AMBULATORY_CARE_PROVIDER_SITE_OTHER): Payer: 59 | Admitting: Psychologist

## 2020-02-06 DIAGNOSIS — F411 Generalized anxiety disorder: Secondary | ICD-10-CM | POA: Diagnosis not present

## 2020-02-06 DIAGNOSIS — Z1231 Encounter for screening mammogram for malignant neoplasm of breast: Secondary | ICD-10-CM

## 2020-02-06 NOTE — Telephone Encounter (Signed)
We will need to know what day her symptoms started (if she had any) and what day she had her test done in order to write the note.   Marcy Siren, D.O. Primary Care at [facility]  02/06/2020, 1:08 PM

## 2020-02-06 NOTE — Telephone Encounter (Signed)
ATC pt for info, LM to [facility]

## 2020-02-07 NOTE — Telephone Encounter (Signed)
ATC, no answer, LM to [facility]

## 2020-02-12 ENCOUNTER — Other Ambulatory Visit: Payer: Self-pay | Admitting: *Deleted

## 2020-02-12 ENCOUNTER — Telehealth: Payer: Self-pay | Admitting: Licensed Clinical Social Worker

## 2020-02-12 DIAGNOSIS — I890 Lymphedema, not elsewhere classified: Secondary | ICD-10-CM

## 2020-02-12 NOTE — Telephone Encounter (Signed)
CSW received a call back from pt [phone]). CSW re-introduced self, role, reason for initial call. Pt shares that she has not received a formal eviction notice from her complex but that the management had sent her an e-mail and when she spoke with one of the staff which indicated they may take legal action for back rent. CSW inquired about [employer] which pt had applied for in 2021. Pt has received a letter stating she was approved for assistance from Sept. 2021 to Feb 2022. Pt states she never heard from [facility] after referral in December. CSW will re-refer pt to their eviction mediation services. Left a HIPAA compliant message for Dr. Chucky May who facilitates their eviction mediation program. Pt states understanding and will f/u with this writer if she hasn't heard from their program by the end of the week.  Octavio Graves, MSW, LCSW [facility]  [phone]

## 2020-02-12 NOTE — Telephone Encounter (Signed)
CSW received message from care guide, per pt email to care guide regarding potential legal challenges w/ her apartment complex around rent. CSW and [employer] had previously assisted pt with rental and car payment assistance on 12/04/19. I also had made a referral to [facility] on 01/03/20. From my understanding during previous conversations pt was also waiting on determination for [employer] towards rental assistance/relief.   CSW called and left HIPAA compliant message for pt at [phone].  Octavio Graves, MSW, LCSW [facility]  [phone]

## 2020-02-20 ENCOUNTER — Ambulatory Visit: Payer: 59 | Admitting: Psychologist

## 2020-02-20 ENCOUNTER — Ambulatory Visit: Payer: 59

## 2020-02-20 ENCOUNTER — Other Ambulatory Visit: Payer: Self-pay

## 2020-02-20 ENCOUNTER — Ambulatory Visit (INDEPENDENT_AMBULATORY_CARE_PROVIDER_SITE_OTHER): Payer: 59 | Admitting: Pharmacist Clinician (PhC)/ Clinical Pharmacy Specialist

## 2020-02-20 DIAGNOSIS — I10 Essential (primary) hypertension: Secondary | ICD-10-CM | POA: Diagnosis not present

## 2020-02-20 NOTE — Patient Instructions (Signed)
Return for a a follow up appointment February 8 at 10 am  Check your blood pressure at home daily and keep record of the readings.  Take your BP meds as follows:  Start chlorthalidone 1 tablet every day (not 1/2 tab)  Continue with all other medications  Bring all of your meds, your BP cuff and your record of home blood pressures to your next appointment.  Exercise as you're able, try to walk approximately 30 minutes per day.  Keep salt intake to a minimum, especially watch canned and prepared boxed foods.  Eat more fresh fruits and vegetables and fewer canned items.  Avoid eating in fast food restaurants.    HOW TO TAKE YOUR BLOOD PRESSURE: . Rest 5 minutes before taking your blood pressure. .  Don't smoke or drink caffeinated beverages for at least 30 minutes before. . Take your blood pressure before (not after) you eat. . Sit comfortably with your back supported and both feet on the floor (don't cross your legs). . Elevate your arm to heart level on a table or a desk. . Use the proper sized cuff. It should fit smoothly and snugly around your bare upper arm. There should be enough room to slip a fingertip under the cuff. The bottom edge of the cuff should be 1 inch above the crease of the elbow. . Ideally, take 3 measurements at one sitting and record the average.

## 2020-02-20 NOTE — Assessment & Plan Note (Signed)
Patient with resistant hypertension, still unchanged today.  Same situation as previous, has not taken meds today, but states otherwise has been compliant since recovering from Covid 10-12 days ago.  Will have her start the chlorthalidone at 1 tablet daily and return in 2 weeks for follow up. Had a frank discussion on the risk of developing heart disease due to her continually uncontrolled readings.  It was stressed that she should take her medications as prescribed, monitor her BP and bring all meds to her next visit.  We will call her the day prior to remind her of this.

## 2020-02-20 NOTE — Progress Notes (Signed)
HPI:  Denise Carter is a 50 y.o. female patient of Dr Duke Salvia, who presents today for advanced hypertension clinic follow up.  Her past medical history is significant only for non-cardiac issues including RA, Sjogren's disease and SLE.  Previously she had problems with several medications (see below), but much of it sounds to be related to just lack of effective combinations/doses. Dr. Duke Salvia set her up to see our Care Guide, enrolled her in PREP exercise program and did blood work to check metanephrines, renin/aldosterone and hypokalemia.  She was given a blood pressure cuff and registered with [facility]. Patient also completed her sleep study was recommended for CPAP by Dr. Mayford Knife. Valsartan was changed to irbesartan, then increased to 300mg  daily on 08/22/2019 after review of [facility] report.  She has also been working with our Care Guide to decrease stressors and help with smoking cessation.    Ms Denise Carter has been seen multiple times by Dr. Sibyl Parr, Dr Duke Salvia and myself, but still has been unable to achieve anything close to BP goals.  We have checked with her pharmacy last fall and found her compliance to be questionable.  On most visit days she notes that she didn't have time to take her medications that morning.  Today is not much different.  She notes that now 3 of her 7 children are incarcerated and a 48 year old granddaughter is hospitalized with head injuries (CPS involvement).  Patient is unable to get information about this and states it causes further stress.   She did not take her meds this morning and missed her appointment at 8:45 with our Care Guide because she overslept.  She had Covid diagnosis the first week of January and states she didn't take her meds for about 10 days while she felt poorly.  At her last visit (Dec 29) we gave her a prescription for chlorthalidone (1/2 tablet daily).  She states she picked it up, but then had Covid - by the time she felt better she  didn't know what the med was for and therefore hasn't taken any.     Secondary cause testing: Pheochromocytoma 4/21:Metanephrines WNL  hypoaldosteronism 4/21:Renin low (<0.167), aldosterone WNL, aldost/renin ratio WNL  Sleep apnea 8/21: OSA , CPAP titration recommended  Renovascular disease 12/19: ultrasound WNL     Blood Pressure Goal:  130/80  Current Medications: Irbesartan 300mg  qd pm, amlodipine 5 mg qd - pm, spironolactone 25 mg - am  Family Hx: mother died at 50 cervical caner, mgp both with hypertension; father died at 63 due to drug issues; no whole sibblings; 7 chidren oldest 61 - no known issues for any at this time  Social Hx: still 1/2+  pack per day - is trying to quit - on bupropion, but it does not appear to be cutting back her cravings; no alcohol; caffeine daily (multiple cups of coffee and Coke throughout the day)  Diet: low appetite due to meds, notes lost appetite after d/c prednisone about 8-9 months ago (d/c from 20 mg to 10 mg the d/c) - mostly coffee diet right now - however hasn't lost any weight, tried decaf but back on regular  Exercise: PREP doesn't have any evening schedules, confilcts with work (5 am-10-12 pm at 56) - unable to due to work and family struggles  Home BP readings: 3 readings in past 2 weeks average 163/119.  We have turned off the [facility] app and asked that she write readings down on a  log sheet from now on.  Intolerances: Labetalol - HA HCTZ - HA Valsartan - diarrhea Amlodipine causes headaches at 10 mg Hydralazine - headaches  Labs:  8/21:  Na 141, K 4.4, Glu 69, BUN 8, SCR 0.76 GFR 103  12/21:  Na 140, K 4.1, Glu 83, BUN 7, SCr 0.89 GFR 87   Wt Readings from Last 3 Encounters:  02/20/20 174 lb 12.8 oz (79.3 kg)  01/23/20 175 lb (79.4 kg)  12/24/19 174 lb (78.9 kg)   BP Readings from Last 3 Encounters:  02/20/20 (!) 162/108  01/23/20 (!) 170/110  12/24/19 (!) 138/102   Pulse Readings from Last 3 Encounters:   02/20/20 84  01/23/20 94  12/24/19 82    Current Outpatient Medications  Medication Sig Dispense Refill  . amLODipine (NORVASC) 5 MG tablet Take 1 tablet (5 mg total) by mouth daily. 90 tablet 1  . aspirin (EC-81 ASPIRIN) 81 MG EC tablet Take 1 tablet (81 mg total) by mouth daily. Swallow whole. 30 tablet 12  . atorvastatin (LIPITOR) 40 MG tablet Take 1 tablet (40 mg total) by mouth daily. 90 tablet 3  . chlorthalidone (HYGROTON) 25 MG tablet Take 0.5 tablets (12.5 mg total) by mouth daily. 90 tablet 3  . hydroxychloroquine (PLAQUENIL) 200 MG tablet Take 1 tablet by mouth 2 (two) times a day.    . irbesartan (AVAPRO) 300 MG tablet Take 1 tablet (300 mg total) by mouth daily. 90 tablet 3  . pregabalin (LYRICA) 150 MG capsule Take 1 capsule (150 mg total) by mouth 2 (two) times daily. (Patient taking differently: Take 150 mg by mouth 2 (two) times daily. Takes 1 most nights, occasionally 2) 180 capsule 1  . RINVOQ 15 MG TB24 Take 1 tablet by mouth daily.    Marland Kitchen spironolactone (ALDACTONE) 50 MG tablet Take 1 tablet (50 mg total) by mouth daily. 90 tablet 1   No current facility-administered medications for this visit.    Allergies  Allergen Reactions  . Labetalol     Headaches    Past Medical History:  Diagnosis Date  . Atypical chest pain 05/23/2019  . Essential hypertension 05/23/2019  . Hypertension   . Lupus (HCC)   . OSA (obstructive sleep apnea) 11/19/2019  . Shortness of breath 05/23/2019  . Snoring 05/23/2019  . Thyroid disease     Blood pressure (!) 162/108, pulse 84, weight 174 lb 12.8 oz (79.3 kg).  Resistant hypertension Patient with resistant hypertension, still unchanged today.  Same situation as previous, has not taken meds today, but states otherwise has been compliant since recovering from Covid 10-12 days ago.  Will have her start the chlorthalidone at 1 tablet daily and return in 2 weeks for follow up. Had a frank discussion on the risk of developing heart disease  due to her continually uncontrolled readings.  It was stressed that she should take her medications as prescribed, monitor her BP and bring all meds to her next visit.  We will call her the day prior to remind her of this.    Phillips Hay PharmD CPP [facility] [address] [place] [address] 02/20/2020 10:11 AM

## 2020-02-26 ENCOUNTER — Ambulatory Visit: Payer: 59 | Admitting: Psychologist

## 2020-02-27 ENCOUNTER — Ambulatory Visit: Payer: 59 | Admitting: Physician Assistant

## 2020-02-27 ENCOUNTER — Ambulatory Visit (HOSPITAL_COMMUNITY)
Admission: RE | Admit: 2020-02-27 | Discharge: 2020-02-27 | Disposition: A | Discharge status: Home / Self Care | Payer: 59 | Source: Ambulatory Visit | Attending: Physician Assistant | Admitting: Physician Assistant

## 2020-02-27 ENCOUNTER — Other Ambulatory Visit: Payer: Self-pay

## 2020-02-27 VITALS — BP 152/114 | HR 97 | Temp 97.9°F | Resp 16 | Ht 64.0 in | Wt 170.0 lb

## 2020-02-27 DIAGNOSIS — I890 Lymphedema, not elsewhere classified: Secondary | ICD-10-CM | POA: Diagnosis present

## 2020-02-27 DIAGNOSIS — I872 Venous insufficiency (chronic) (peripheral): Secondary | ICD-10-CM | POA: Diagnosis not present

## 2020-02-27 NOTE — Progress Notes (Signed)
Requested by:  Sheral Apley, MD [address] [place],  [place] [address]  Reason for consultation: swelling of LLE   History of Present Illness   Denise Carter is a 50 y.o. ([birth year]) female who presents for evaluation of left lower extremity swelling. She does have swelling bilaterally but explains that the left is usually greater that the right. The swelling has occurred on and off for 20+ years. She says at times she can barely walk because of the discomfort in her left leg. Usually her legs feel worse at the end of the day once she is off of work. She works at [employer] so she is required to stand and lift objects during her entire shift. She has been working there for 15 years. She describes her leg discomfort as aching, throbbing, heaviness and tiredness. She has elevated in the past but felt it did not really help. She also has worn OTC compression stockings in the past but states that she could not tolerate them due to the tightness. She otherwise has tried lyrica, gabapentin, and meloxicam for pain relief. She currently still takes only Lyrica which only helps occasionally. She does have known arthritis which she knows is a contributing factor to her discomfort. She denies any history of DVT. She has no family history of DVT. She does have family history of venous disease in her Maternal Aunt and grandmother.  Venous symptoms include: aching, heavy,tired, throbbing, swelling Onset/duration:  20+ Occupation:  [employer] Aggravating factors: sitting, standing, prolonged ambulation Alleviating factors: elevation Compression:  Has tried in the past Helps: unable to tolerate Pain medications:  Lyrica, gabapentin, Meloxicam Previous vein procedures: no History of DVT: no  Past Medical History:  Diagnosis Date  . Atypical chest pain 05/23/2019  . Essential hypertension 05/23/2019  . Hypertension   . Lupus (HCC)   . OSA (obstructive sleep apnea) 11/19/2019  .  Shortness of breath 05/23/2019  . Snoring 05/23/2019  . Thyroid disease     Past Surgical History:  Procedure Laterality Date  . ABDOMINAL HYSTERECTOMY      Social History   Socioeconomic History  . Marital status: Married    Spouse name: Not on file  . Number of children: Not on file  . Years of education: Not on file  . Highest education level: Not on file  Occupational History  . Not on file  Tobacco Use  . Smoking status: Current Every Day Smoker    Packs/day: 0.25    Types: Cigarettes  . Smokeless tobacco: Never Used  . Tobacco comment: Patient is participating in health coaching for smoking cessation.   Vaping Use  . Vaping Use: Never used  Substance and Sexual Activity  . Alcohol use: No  . Drug use: No  . Sexual activity: Not on file  Other Topics Concern  . Not on file  Social History Narrative   Right handed      Highest level of edu- 12th grade      7 children      Apartment- 2nd floor   Social Determinants of Health   Financial Resource Strain: High Risk  . Difficulty of Paying Living Expenses: Very hard  Food Insecurity: No Food Insecurity  . Worried About Programme researcher, broadcasting/film/video in the Last Year: Never true  . Ran Out of Food in the Last Year: Never true  Transportation Needs: No Transportation Needs  . Lack of Transportation (Medical): No  . Lack of Transportation (  Non-Medical): No  Physical Activity: Inactive  . Days of Exercise per Week: 0 days  . Minutes of Exercise per Session: 0 min  Stress: No Stress Concern Present  . Feeling of Stress : Only a little  Social Connections: Not on file  Intimate Partner Violence: Not on file    Family History  Problem Relation Age of Onset  . Hypertension Mother   . Hypertension Maternal Grandmother   . Lupus Maternal Grandmother   . Heart failure Maternal Grandmother   . Stroke Maternal Grandmother   . CAD Maternal Grandmother   . Adrenal disorder Neg Hx     Current Outpatient Medications   Medication Sig Dispense Refill  . amLODipine (NORVASC) 5 MG tablet Take 1 tablet (5 mg total) by mouth daily. 90 tablet 1  . aspirin (EC-81 ASPIRIN) 81 MG EC tablet Take 1 tablet (81 mg total) by mouth daily. Swallow whole. 30 tablet 12  . atorvastatin (LIPITOR) 40 MG tablet Take 1 tablet (40 mg total) by mouth daily. 90 tablet 3  . chlorthalidone (HYGROTON) 25 MG tablet Take 0.5 tablets (12.5 mg total) by mouth daily. 90 tablet 3  . hydroxychloroquine (PLAQUENIL) 200 MG tablet Take 1 tablet by mouth 2 (two) times a day.    . irbesartan (AVAPRO) 300 MG tablet Take 1 tablet (300 mg total) by mouth daily. 90 tablet 3  . pregabalin (LYRICA) 150 MG capsule Take 1 capsule (150 mg total) by mouth 2 (two) times daily. (Patient taking differently: Take 150 mg by mouth 2 (two) times daily. Takes 1 most nights, occasionally 2) 180 capsule 1  . RINVOQ 15 MG TB24 Take 1 tablet by mouth daily.    Marland Kitchen spironolactone (ALDACTONE) 50 MG tablet Take 1 tablet (50 mg total) by mouth daily. 90 tablet 1   No current facility-administered medications for this visit.    Allergies  Allergen Reactions  . Labetalol Anxiety    Headaches Headaches    REVIEW OF SYSTEMS (negative unless checked):   Cardiac:  []  Chest pain or chest pressure? []  Shortness of breath upon activity? []  Shortness of breath when lying flat? []  Irregular heart rhythm?  Vascular:  []  Pain in calf, thigh, or hip brought on by walking? []  Pain in feet at night that wakes you up from your sleep? []  Blood clot in your veins? []  Leg swelling?  Pulmonary:  []  Oxygen at home? []  Productive cough? []  Wheezing?  Neurologic:  []  Sudden weakness in arms or legs? []  Sudden numbness in arms or legs? []  Sudden onset of difficult speaking or slurred speech? []  Temporary loss of vision in one eye? []  Problems with dizziness?  Gastrointestinal:  []  Blood in stool? []  Vomited blood?  Genitourinary:  []  Burning when urinating? []  Blood in  urine?  Psychiatric:  []  Major depression  Hematologic:  []  Bleeding problems? []  Problems with blood clotting?  Dermatologic:  []  Rashes or ulcers?  Constitutional:  []  Fever or chills?  Ear/Nose/Throat:  []  Change in hearing? []  Nose bleeds? []  Sore throat?  Musculoskeletal:  []  Back pain? []  Joint pain? []  Muscle pain?   Physical Examination     Vitals:   02/27/20 1330  BP: (!) 152/114  Pulse: 97  Resp: 16  Temp: 97.9 F (36.6 C)  TempSrc: Temporal  SpO2: 98%  Weight: 170 lb (77.1 kg)  Height: 5\' 4"  (1.626 m)   Body mass index is 29.18 kg/m.  General:  WDWN in NAD; vital signs documented above Gait:  Normal HENT: WNL, normocephalic Pulmonary: normal non-labored breathing Cardiac: regular HR Abdomen: soft, NT, no masses Vascular Exam/Pulses:  Right Left  Radial 2+ (normal) 2+ (normal)  Femoral 2+ (normal) 2+ (normal)  Popliteal 2+ (normal) 2+ (normal)  DP 2+ (normal) 2+ (normal)  PT 2+ (normal) 2+ (normal)   Extremities: without varicose veins, without reticular veins, without edema, without stasis pigmentation, without lipodermatosclerosis, without ulcers. No appreciable swelling of BLE Musculoskeletal: no muscle wasting or atrophy  Neurologic: A&O X 3;  No focal weakness or paresthesias are detected Psychiatric:  The pt has Normal affect.  Non-invasive Vascular Imaging   BLE Venous Insufficiency Duplex (02/27/20):    LLE:  No DVT and SVT  GSV reflux at [facility], distal thigh and at knee  GSV diameter 0.319-0.417  No SSV reflux   No deep venous reflux   Medical Decision Making   Denise Carter is a 50 y.o. female who presents with: BLE chronic venous insufficiency, varicose veins with complications. Her duplex today shows superficial reflux in her left GSV. She has no deep reflux and no DVT of SVT. Her veins are adequate size to be considered for venous ablation in the future.  Based on the patient's history and examination, I  recommend compression, elevation, and exercise therapy  Discussed trying to take short breaks at work to elevate her legs and to try and avoid prolonged sitting or standing  Showed her proper form for elevating her leg and recommend she do this at least 30 minutes a day  I discussed with the patient the use of her 20-30 mm thigh high compression stockings and need for 3 month trial of such. She was measured today for these and is a size Medium. She says she will have to return to purchase these at a later date  The patient will follow up in 3 months with Dr. Darrick Penna or Dr. Edilia Bo  Thank you for allowing [place] to participate in this patient's care.   Graceann Congress, PA-C [facility] Office: [phone]  02/27/2020, 1:36 PM  Clinic MD: On call MD Dr. Myra Gianotti

## 2020-03-03 ENCOUNTER — Telehealth: Payer: Self-pay

## 2020-03-03 NOTE — Telephone Encounter (Signed)
lmom pt to take meds tonight and in am prior to appt and bring bp meter to their appt

## 2020-03-03 NOTE — Telephone Encounter (Signed)
-----   Message from Rosalee Kaufman, RPH-CPP sent at 02/20/2020 10:13 AM EST ----- Call her to remind her to take meds tonight and tomorrow morning, bring her BP readings and all her meds to that appointment

## 2020-03-04 ENCOUNTER — Other Ambulatory Visit: Payer: Self-pay

## 2020-03-04 ENCOUNTER — Ambulatory Visit (INDEPENDENT_AMBULATORY_CARE_PROVIDER_SITE_OTHER): Payer: 59 | Admitting: Pharmacist

## 2020-03-04 ENCOUNTER — Ambulatory Visit (INDEPENDENT_AMBULATORY_CARE_PROVIDER_SITE_OTHER): Payer: 59

## 2020-03-04 VITALS — BP 176/108 | HR 88 | Resp 15 | Ht 64.0 in | Wt 181.2 lb

## 2020-03-04 DIAGNOSIS — Z0000 Encounter for general adult medical examination without abnormal findings: Secondary | ICD-10-CM

## 2020-03-04 DIAGNOSIS — I10 Essential (primary) hypertension: Secondary | ICD-10-CM | POA: Diagnosis not present

## 2020-03-04 MED ORDER — SPIRONOLACTONE 50 MG PO TABS
50.0000 mg | ORAL_TABLET | Freq: Every day | ORAL | 3 refills | Status: DC
Start: 2020-03-04 — End: 2020-04-16

## 2020-03-04 MED ORDER — AMLODIPINE BESYLATE 5 MG PO TABS: 5.0000 mg | ORAL_TABLET | Freq: Every day | ORAL | 3 refills | Status: DC

## 2020-03-04 MED ORDER — CHLORTHALIDONE 25 MG PO TABS
25.0000 mg | ORAL_TABLET | Freq: Every day | ORAL | 3 refills | Status: DC
Start: 2020-03-04 — End: 2021-02-06

## 2020-03-04 MED ORDER — IRBESARTAN 300 MG PO TABS
300.0000 mg | ORAL_TABLET | Freq: Every day | ORAL | 3 refills | Status: DC
Start: 2020-03-04 — End: 2021-02-06

## 2020-03-04 NOTE — Patient Instructions (Signed)
Return for a  follow up appointment in 4 weeks  Go to the lab in NEXT OFFICE VISIT  Check your blood pressure at home daily (if able) and keep record of the readings.  Take your BP meds as follows: *IRBESARTAN 300mg  (STOP taking 150mg )  *AMLODIPINE 5mg  DAILY *SPIRONOLATONE 50mg  DAILY *CHLORTHALIDONE 25mg  EVERY MORNING*  Bring all of your meds, your BP cuff and your record of home blood pressures to your next appointment.  Exercise as you're able, try to walk approximately 30 minutes per day.  Keep salt intake to a minimum, especially watch canned and prepared boxed foods.  Eat more fresh fruits and vegetables and fewer canned items.  Avoid eating in fast food restaurants.    HOW TO TAKE YOUR BLOOD PRESSURE: . Rest 5 minutes before taking your blood pressure. .  Don't smoke or drink caffeinated beverages for at least 30 minutes before. . Take your blood pressure before (not after) you eat. . Sit comfortably with your back supported and both feet on the floor (don't cross your legs). . Elevate your arm to heart level on a table or a desk. . Use the proper sized cuff. It should fit smoothly and snugly around your bare upper arm. There should be enough room to slip a fingertip under the cuff. The bottom edge of the cuff should be 1 inch above the crease of the elbow. . Ideally, take 3 measurements at one sitting and record the average.

## 2020-03-04 NOTE — Progress Notes (Signed)
Appointment Outcome:  Completed, Session #: 32  AGREEMENTS SECTION   Overall Goal(s): Smoking cessation Stress management Manage hypertension Decrease caffeine consumption                                                 Agreement/Action Steps:  Smoking cessation Take Wellbutrin as prescribed  Stress management Maintain healthy boundaries Attend therapy as scheduled  Manage hypertension Check blood pressure and record daily Take HTN medication as prescribed  Decrease caffeine consumption Limit daily iced/regular coffee and soda consumption   Progress Notes:  Patient stated that she has not been taking Wellbutrin because she thought she was not supposed to smoke and take the medication. The patient mentioned that she has patches but does not use them either. Patient has been smoking about a pack of cigarettes a day now because of the amount of stress that she is under. Patient stated that's how she is coping and keeping calm. Patient stated that she smokes the most when she is at home because this is the most stressful place.   Patient reported that she has three sons in jail and a grandchild that is hospitalized that is causing her the most stress right now. Because of the current situations she is facing, she feels that her boundaries and time are not being respected because her family is not use to them. Patient mentioned that in her home, enforcing her boundaries has been working well because she also practices holding her peace to avoid conflict. Patient stated this has been a great way of managing stress there. Patient stated that she continues to remove herself from unhealthy encounters when necessary to minimize stress. Patient stated that she has not been able to take time for herself until this past Friday.   Patient stated that she really has not been drinking ice coffee since it's been cold. Patient stated that she now limits herself to two cups of coffee a day, one in the  morning and one at night. Patient stated that she does not drink as much soda as she used to. Previously, she would drink about 1/2 soda during indigestion does not drink that much anymore and feel like she is wasting soda. Patient stated that she has been drinking vinegar instead.   Patient recently cancelled her therapy appointment. She feels as though she is not benefiting from these services because the provider is not offering any type of advice or therapeutic treatment. Patient stated that the provider only empathizes with her and that doesn't help her.   Patient has been able monitor blood pressure and keep a written record of the readings. Patient reports taking her medications at various times during the day.   . Indicators of Success and Accountability:  Patient stated that cutting back on her caffeine consumption is an indicator of her success.  . Readiness: Patient is in the action phase of stress management, managing hypertension, reducing caffeine consumption, and is in the preparation stage of smoking cessation again.  . Strengths and Supports: Patient has a close friend that she relies on for support. Patient is a critical thinker and feels that her ability to keep herself together are her strengths.  . Challenges and Barriers: Patient stated that her not putting in the effort would be the only challenge.  Coaching Outcomes: Patient recognized that she has made a conscious  decision to focus on circumstances where she can be the most helpful even though it is stress. This strategy has helped keep her from becoming too overwhelmed even though she feels as though she is being pulled in different directions right now.   Patient was asked what she could do to ensure that she takes at least 30 minutes/day for self-care, what would that look like. Patient stated that she has been thinking about that and is not sure at this time. Patient was encouraged to think of an activity that she could  engage in to help her relax during that time.   Patient will aim for 15 cigarettes/day over the next two weeks. Patient will resume taking Wellbutrin as prescribed.   Patient was given a sheet to record her blood pressure readings from [place] on to bring to her next appointment with the pharmacy. Her prescriptions have been updated and submitted to her local pharmacy for pick up. Patient will begin taking these medications for blood pressure management as prescribed.   Patient will continue to enforce and practice health boundaries with friends and family although she is getting push back.   Patient will continue to reduce her caffeine intake by limiting herself to two cups a coffee a day and not drinking soda during indigestion.   Patient is interested in finding a different therapist. Patient plans to contact her provider to discuss her concerns regarding indigestion.  Attempted: Marland Kitchen Fulfilled #- Patient decreased caffeine, enforcing healthy boundaries, taking blood pressure medication and monitoring blood pressure.  . Partial #-  . Not met #- Patient is not taking Wellbutrin as prescribed. Patient did not attend therapy as scheduled.    Referrals: Care Guide will email a list of [place] health providers to the patient.

## 2020-03-04 NOTE — Progress Notes (Signed)
HPI:  Denise Carter is a 50 y.o. female patient of Dr Duke Salvia, who presents today for advanced hypertension clinic follow up.  Her past medical history is significant only for non-cardiac issues including RA, Sjogren's disease and SLE.  Previously she had problems with several medications (see below), but much of it sounds to be related to just lack of effective combinations/doses. Dr. Duke Salvia set her up to see our Care Guide, enrolled her in [facility] exercise program and did blood work to check metanephrines, renin/aldosterone and hypokalemia.  She was given a blood pressure cuff and registered with [facility]. Patient also completed her sleep study was recommended for CPAP by Dr. Mayford Knife.   Denise Carter has been seen multiple times by Dr. Duke Salvia, Dr Penni Homans and myself, but still has been unable to achieve anything close to BP goals.  We verify refill history with her pharmacy. Patient continues to have compliance issues, and never picked up her last prescription for chlorthalidone. She is taking irbesartan 150mg  instead of 300mg  for unknown reason as well. Patient increased the amount of cigarettes per day , but is trying to decrease tobacco use again. Patient has appoiment with care guide today right after this visit.  Noted new diagnosed varicosis disease on left leg.   Secondary cause testing: Pheochromocytoma 4/21:Metanephrines WNL  hypoaldosteronism 4/21:Renin low (<0.167), aldosterone WNL, aldost/renin ratio WNL  Sleep apnea 8/21: OSA , CPAP titration recommended  Renovascular disease 12/19: ultrasound WNL     Blood Pressure Goal:  130/80  Current Medications: Irbesartan 300mg  daily - taking 150mg  instead (morning 11:30am) amlodipine 5 mg daily - evening after supper (8pm) spironolactone 50 mg daily - evening chlorthalidone 12.5mg  daily - NOT TAKING (never picked up from pharmacy)  Family Hx: mother died at 73 cervical caner, mgp both with hypertension; father died at 33 due  to drug issues; no whole sibblings; 7 chidren oldest 30 - no known issues for any at this time  Social Hx: still smoking pack per day - is trying to quit - on bupropion, but it does not appear to be cutting back her cravings; no alcohol; caffeine daily (multiple cups of coffee and Coke throughout the day)  Diet: low appetite due to meds, notes lost appetite after d/c prednisone about 8-9 months ago (d/c from 20 mg to 10 mg the d/c) - mostly coffee diet right now - however hasn't lost any weight, tried decaf but back on regular  Exercise: [facility] doesn't have any evening schedules, confilcts with work (5 am-10-12 pm at ) - unable to due to work and family struggles  Home BP readings:  9 readings, average 152/112, HR range 85-97 bpm  Intolerances: Labetalol - HA HCTZ - HA Valsartan - diarrhea Amlodipine causes headaches at 10 mg Hydralazine - headaches  Labs: 12/21:  Na 140, K 4.1, Glu 83, BUN 7, SCr 0.89 GFR 87   Wt Readings from Last 3 Encounters:  03/04/20 181 lb 3.2 oz (82.2 kg)  02/27/20 170 lb (77.1 kg)  02/20/20 174 lb 12.8 oz (79.3 kg)   BP Readings from Last 3 Encounters:  03/04/20 (!) 176/108  02/27/20 (!) 152/114  02/20/20 (!) 162/108   Pulse Readings from Last 3 Encounters:  03/04/20 88  02/27/20 97  02/20/20 84    Current Outpatient Medications  Medication Sig Dispense Refill  . aspirin (EC-81 ASPIRIN) 81 MG EC tablet Take 1 tablet (81 mg total) by mouth daily. Swallow whole. 30 tablet 12  .  atorvastatin (LIPITOR) 40 MG tablet Take 1 tablet (40 mg total) by mouth daily. 90 tablet 3  . hydroxychloroquine (PLAQUENIL) 200 MG tablet Take 1 tablet by mouth 2 (two) times a day.    . pregabalin (LYRICA) 150 MG capsule Take 1 capsule (150 mg total) by mouth 2 (two) times daily. (Patient taking differently: Take 150 mg by mouth 2 (two) times daily. Takes 2 pills nightly) 180 capsule 1  . RINVOQ 15 MG TB24 Take 1 tablet by mouth daily.    Marland Kitchen amLODipine  (NORVASC) 5 MG tablet Take 1 tablet (5 mg total) by mouth daily. 90 tablet 3  . chlorthalidone (HYGROTON) 25 MG tablet Take 1 tablet (25 mg total) by mouth daily. 90 tablet 3  . irbesartan (AVAPRO) 300 MG tablet Take 1 tablet (300 mg total) by mouth daily. 90 tablet 3  . spironolactone (ALDACTONE) 50 MG tablet Take 1 tablet (50 mg total) by mouth daily. 90 tablet 3   No current facility-administered medications for this visit.    Allergies  Allergen Reactions  . Labetalol Anxiety    Headaches Headaches    Past Medical History:  Diagnosis Date  . Atypical chest pain 05/23/2019  . Essential hypertension 05/23/2019  . Hypertension   . Lupus (HCC)   . OSA (obstructive sleep apnea) 11/19/2019  . Shortness of breath 05/23/2019  . Snoring 05/23/2019  . Thyroid disease     Blood pressure (!) 176/108, pulse 88, resp. rate 15, height 5\' 4"  (1.626 m), weight 181 lb 3.2 oz (82.2 kg), SpO2 99 %.  Resistant hypertension Patient not taking chlorthalidone, taking irbesartan 150mg  instead of 300mg , and not regula refill of amlodipine. She reports ability to afford medication and agrees to pick up refill for ALL medication today.  Refill all BP medication send today. Instructions to stop spironolactone 25mg , stop irbesartan 150mg  and start chlorthalidone 25mg  daily. We will repeat BMET during next office visit and continue to re-assess medication compliance.    Gloriana Piltz Rodriguez-Guzman PharmD, BCPS, CPP [facility] [address] [place] 03/06/2020 5:33 PM

## 2020-03-06 NOTE — Assessment & Plan Note (Signed)
Patient not taking chlorthalidone, taking irbesartan 150mg  instead of 300mg , and not regula refill of amlodipine. She reports ability to afford medication and agrees to pick up refill for ALL medication today.  Refill all BP medication send today. Instructions to stop spironolactone 25mg , stop irbesartan 150mg  and start chlorthalidone 25mg  daily. We will repeat BMET during next office visit and continue to re-assess medication compliance.

## 2020-03-17 ENCOUNTER — Other Ambulatory Visit: Payer: Self-pay

## 2020-03-17 ENCOUNTER — Ambulatory Visit (INDEPENDENT_AMBULATORY_CARE_PROVIDER_SITE_OTHER): Payer: 59 | Admitting: Neurology

## 2020-03-17 ENCOUNTER — Encounter: Payer: Self-pay | Admitting: Neurology

## 2020-03-17 VITALS — BP 157/119 | HR 99 | Ht 64.0 in | Wt 177.0 lb

## 2020-03-17 DIAGNOSIS — M0550 Rheumatoid polyneuropathy with rheumatoid arthritis of unspecified site: Secondary | ICD-10-CM | POA: Diagnosis not present

## 2020-03-17 DIAGNOSIS — G63 Polyneuropathy in diseases classified elsewhere: Secondary | ICD-10-CM | POA: Diagnosis not present

## 2020-03-17 DIAGNOSIS — M329 Systemic lupus erythematosus, unspecified: Secondary | ICD-10-CM

## 2020-03-17 NOTE — Progress Notes (Signed)
Follow-up Visit   Date: 03/17/20   Denise Carter MRN: [id number] DOB: Dec 09, 1970   Interim History: Denise Carter is a 50 y.o. right-handed African American female with RA, SLE, hyperlipidemia, and tobacco use returning to the clinic for follow-up of neuropathy.  The patient was accompanied to the clinic by self  History of present illness: She moved from [place] in 2018 and was seeing a neurologist for neuropathy last in 2016. She reports having numbness and tingling of the toes and feet and had NCS/EMG of the legs in 2015 which confirmed the presence of neuropathy.  She was being prescribed Lyrica 150mg  which she takes (2) tablets at bedtime and provides relief. She has previously tried gabapentin.  She walks unassisted and endorses mild imbalance.  She also complains of right hand tingling and is scheduled to undergo right CTS release.    She works as as a Biochemist, clinical.  She lives with husband, daughter, son, and two grandchildren. No history of diabetes or alcohol use.  She is seeing Art gallery manager, PA at [facility].  Medical records received from [facility], Dr. [place] and summarized below: She was establish at their clinic from 2015-2016.  She was diagnosed with lupus in October 2014 manifesting with diffuse pain and paresthesias, which improved with steroids.  Subsequently, she developed more numbness and tingling in the legs.  Her work-up involved MRI brain, cervical spine, and thoracic spine, which was normal.  Electrodiagnostic testing showed mild sensory polyneuropathy.  UPDATE 03/17/2020:  She is here for follow-up visit.   Starting around 7pm, her legs start to burning and sting and it usually takes about 30-40 min beofre she gets relief.  She takes Lyrica 300mg  at 7pm which controls night time symptoms, but her evening pain is bothersome. She continues to work at 03/19/2020, but transferred to the clothing area,  because of difficulty opening boxes from her arthritis. She is contemplating on early retirement because she is on her feet all day on the concrete, despite reducing hours to 25hr/week, she still has significant arthritic pain.  No new falls or hospitalizations.   Medications:  Current Outpatient Medications on File Prior to Visit  Medication Sig Dispense Refill  . amLODipine (NORVASC) 5 MG tablet Take 1 tablet (5 mg total) by mouth daily. 90 tablet 3  . aspirin (EC-81 ASPIRIN) 81 MG EC tablet Take 1 tablet (81 mg total) by mouth daily. Swallow whole. 30 tablet 12  . chlorthalidone (HYGROTON) 25 MG tablet Take 1 tablet (25 mg total) by mouth daily. 90 tablet 3  . hydroxychloroquine (PLAQUENIL) 200 MG tablet Take 1 tablet by mouth 2 (two) times a day.    . irbesartan (AVAPRO) 300 MG tablet Take 1 tablet (300 mg total) by mouth daily. 90 tablet 3  . pregabalin (LYRICA) 150 MG capsule Take 1 capsule (150 mg total) by mouth 2 (two) times daily. 180 capsule 1  . RINVOQ 15 MG TB24 Take 1 tablet by mouth daily.    spironolactone (ALDACTONE) 50 MG tablet Take 1 tablet (50 mg total) by mouth daily. 90 tablet 3  . atorvastatin (LIPITOR) 40 MG tablet Take 1 tablet (40 mg total) by mouth daily. (Patient not taking: Reported on 03/17/2020) 90 tablet 3   No current facility-administered medications on file prior to visit.    Allergies:  Allergies  Allergen Reactions  . Labetalol Anxiety    Headaches Headaches    Vital Signs:  BP (!) 157/119   Pulse 99   Ht 5\' 4"  (1.626 m)   Wt 177 lb (80.3 kg)   SpO2 100%   BMI 30.38 kg/m    Neurological Exam: MENTAL STATUS including orientation to time, place, person, recent and remote memory, attention span and concentration, language, and fund of knowledge is normal.  Speech is not dysarthric.  CRANIAL NERVES:  Normal conjugate, extra-ocular eye movements in all directions of gaze.  No ptosis.    MOTOR:  Motor strength is 5/5 in all extremities.  No  atrophy, fasciculations or abnormal movements.  No pronator drift.  Tone is normal.    MSRs:  Reflexes are 2+/4 throughout, including ankles  SENSORY:  Intact to vibration throughout.  COORDINATION/GAIT:  Normal finger-to- nose-finger.  Intact rapid alternating movements bilaterally.  Gait narrow based and stable.   Data: MRI cervical spine with and without contrast 10/31/2013: No evidence of abnormal cord signal.  Less than 1 cm nodule within the left thyroid gland  MRI thoracic spine wwo contrast 11/05/2013: No evidence of abnormal thoracic cord signal or compression.  NCS/EMG 03/18/2014: Mild predominantly sensory polyneuropathy.  Review of nerve conduction shows absent right sural and superficial peroneal sensory responses and reduced radial sensory response.  Right median, ulnar, peroneal, and tibial motor responses are normal.  IMPRESSION/PLAN: Sensory neuropathy due to underling autoimmune disease (RA and SLE), sensory loss distally on exam.  Reflexes and motor strength is intact.  - Adjust Lyrica to 300mg  at 6pm  - Patient educated on daily foot inspection, fall prevention, and safety precautions around the home.  Return to clinic in 1 year.    Thank you for allowing me to participate in patient's care.  If I can answer any additional questions, I would be pleased to do so.    Sincerely,    Arnetha Silverthorne K. 03/20/2014, DO

## 2020-03-17 NOTE — Patient Instructions (Signed)
Adjust Lyrica to 300mg  at 6pm  Return to clinic in 1 year

## 2020-03-19 ENCOUNTER — Ambulatory Visit (INDEPENDENT_AMBULATORY_CARE_PROVIDER_SITE_OTHER): Payer: 59

## 2020-03-19 ENCOUNTER — Other Ambulatory Visit: Payer: Self-pay

## 2020-03-19 DIAGNOSIS — Z0000 Encounter for general adult medical examination without abnormal findings: Secondary | ICD-10-CM

## 2020-03-19 NOTE — Progress Notes (Signed)
Appointment Outcome:  Completed, Session #: 15  AGREEMENTS SECTION  Overall Goal(s): Smoking cessation Stress management Manage hypertension Decrease caffeine consumption                                                  Agreement/Action Steps:  Smoking cessation Take Wellbutrin as prescribed   Stress management Maintain healthy boundaries Attend therapy as scheduled   Manage hypertension Check blood pressure and record daily Take HTN medication as prescribed   Decrease caffeine consumption Limit daily iced/regular coffee and soda consumption  Progress Notes:  Patient has been taking Wellbutrin as prescribed. Patient stated that she notices a difference in her mood, but not with smoking. Patient stated that it since she has started taking Wellbutrin again, she is able to voice how she feels without hollering, and not be disturbed after expressing herself to be able to go on about her daily activities.   Patient stated that she smokes shorts, and this does not give her the gratification that a 100 cigarette would. Patient stated that she can smoke a short and 20 minutes later, she wants another cigarette. Patient reported that she is smoking 15-17 cigarettes daily. Patient has not purchased a full pack of cigarettes but get loosies (single cigarettes that is purchased) from the store or bum them from her family at home. Patient stated that when her husband was working, she would get five cigarettes to last her while he is away from home and get more from him when he returns. Patient is not using nicotine patches that she has.   Patient's smoking cessation is also challenged by pain management. Patient stated that she does use cigarettes to cope with pain. Patient recently had an appointment to address some of her physical pain concerns where a pain medication dosage has been increased. Patient is hopeful this will help deter some of her smoking.   Patient is enforcing healthy  boundaries, although she is not getting the response from her family that would be encouraging for her to continue this behavior. Patient is trying not to feel guilty at times when she enforces her boundaries, by not allowing what others think and say about her influence how she thinks about herself. Patient stated that being assertive and standing her ground makes her feel good about herself.  Patient has managed to check her blood pressure twice in the past week. Patient informed Care Guide that she is to record her readings 4-5 days a week. Patient stated that her challenge has been the fluctuation in her work schedule. Patient stated that she normally would check her blood pressure in the afternoon after work when she has had a chance to eat and take her HTN meds. Patient new schedule inhibits her from engaging in this behavior until later in the day. Patient stated that she needs a better routine for monitoring her blood pressure. Patient mentioned that she is taking four HTN medications now as prescribed after getting things changed with PharmD.  Patient has decreased her caffeine consumption by cutting out sodas. Patient reported that she has increased her water consumption. Patient stated that she drinks hot coffee 4 days out of the week, but this depends on her acid reflux and if she is tired. Patient stated that she may have an iced coffee once a day but not every day.  Patient is not attending therapy as scheduled. Patient is interested in another therapist for behavioral health. Patient is not satisfied with the service that she has received so far.  . Indicators of Success and Accountability:  Patient stated that enforcing her boundaries to manage stress is her biggest success in the past two weeks.  . Readiness: Patient is in the action phase of smoking cessation, stress management, managing hypertension, and decreasing caffeine consumption. . Strengths and Supports: Patient stated that being  assertive and more self-awareness are her strengths. Patient has a close friend that is supporting her.  . Challenges and Barriers: Pain management is the only challenge that the patient foresee to being able to reduce her smoking.   Coaching Outcomes: Patient will aim for 15 cigarettes daily over the next two weeks.    Patient will start checking her blood pressure on Wed and Sat (days off work) and two days of the week before bed to ensure that she is getting at least 4 weekly blood pressure readings.  Patient will continue to drink water throughout the day in attempt to curb cravings for smoking.  It was suggested that the patient wait a few minutes after eating before smoking or spacing out smoking more than 20 minutes. Patient stated that she wasn't ready for these steps.  Patient will continue to implement all other action steps as outlined above.    Attempted: Marland Kitchen Fulfilled #- Patient has been taking Wellbutrin and HTN meds as prescribed. Patient is maintaining healthy boundaries with family and friends. Patient has reduced her caffeine consumption in the past two weeks by cutting out soda.  . Partial #- Patient has checked her blood pressure 2x/wk last week.  . Not met #- Patient has not attended therapy as scheduled. Patient cancelled appointment.   Not attempted: Marland Kitchen Deferred #- Patient is interested in attending therapy but want switch providers. Patient will hold off on therapy until she is able to find another behavioral health service.

## 2020-04-02 ENCOUNTER — Telehealth: Payer: Self-pay

## 2020-04-02 ENCOUNTER — Ambulatory Visit: Payer: 59

## 2020-04-02 ENCOUNTER — Other Ambulatory Visit: Payer: Self-pay

## 2020-04-02 DIAGNOSIS — Z0000 Encounter for general adult medical examination without abnormal findings: Secondary | ICD-10-CM

## 2020-04-02 NOTE — Telephone Encounter (Signed)
Called patient to determine if she wanted to hold missed health coaching session over the phone or to reschedule. Left message for patient to call Care Guide back at [phone].

## 2020-04-09 ENCOUNTER — Telehealth: Payer: Self-pay

## 2020-04-09 DIAGNOSIS — Z0000 Encounter for general adult medical examination without abnormal findings: Secondary | ICD-10-CM

## 2020-04-09 NOTE — Telephone Encounter (Signed)
Called patient to reschedule health coaching session. Patient wanted to reschedule for next Wednesday. Patient has been scheduled for an in-person on 04/16/20.

## 2020-04-12 ENCOUNTER — Other Ambulatory Visit: Payer: Self-pay | Admitting: Internal Medicine

## 2020-04-12 DIAGNOSIS — J309 Allergic rhinitis, unspecified: Secondary | ICD-10-CM

## 2020-04-16 ENCOUNTER — Ambulatory Visit (INDEPENDENT_AMBULATORY_CARE_PROVIDER_SITE_OTHER): Payer: 59

## 2020-04-16 ENCOUNTER — Other Ambulatory Visit: Payer: Self-pay

## 2020-04-16 ENCOUNTER — Ambulatory Visit (INDEPENDENT_AMBULATORY_CARE_PROVIDER_SITE_OTHER): Payer: 59 | Admitting: Endocrinology

## 2020-04-16 VITALS — BP 220/120 | HR 81 | Ht 64.0 in | Wt 175.6 lb

## 2020-04-16 DIAGNOSIS — I10 Essential (primary) hypertension: Secondary | ICD-10-CM | POA: Diagnosis not present

## 2020-04-16 DIAGNOSIS — Z0000 Encounter for general adult medical examination without abnormal findings: Secondary | ICD-10-CM

## 2020-04-16 MED ORDER — SPIRONOLACTONE 100 MG PO TABS: 100.0000 mg | ORAL_TABLET | Freq: Every day | ORAL | 3 refills | Status: DC

## 2020-04-16 NOTE — Patient Instructions (Addendum)
I have sent a prescription to your pharmacy, to increase the spironolactone. A 24HR urine test is requested for you today.  We'll let you know about the results.  Please come back for a follow-up appointment in 1 month.

## 2020-04-16 NOTE — Progress Notes (Signed)
Subjective:    Patient ID: Denise Carter, female    DOB: 1970-03-02, 50 y.o.   MRN: [id number]  HPI Pt returns for f/u of elev aldo/PRA ratio (dx'ed 2021; rx'ed with aldactone) She also has thyroid nodule (dx'ed2018; f/u US in 2021 advised f/u in 2023; she is euthyroid off rx). Pt reports doe.  She takes meds as rx'ed.  She did not take BP rx today.  Past Medical History:  Diagnosis Date  . Atypical chest pain 05/23/2019  . Essential hypertension 05/23/2019  . Hypertension   . Lupus (HCC)   . OSA (obstructive sleep apnea) 11/19/2019  . Shortness of breath 05/23/2019  . Snoring 05/23/2019  . Thyroid disease   . Varicose veins of left lower extremity 01/26/2020    Past Surgical History:  Procedure Laterality Date  . ABDOMINAL HYSTERECTOMY      Social History   Socioeconomic History  . Marital status: Married    Spouse name: Not on file  . Number of children: Not on file  . Years of education: Not on file  . Highest education level: Not on file  Occupational History  . Not on file  Tobacco Use  . Smoking status: Current Every Day Smoker    Packs/day: 0.25    Types: Cigarettes  . Smokeless tobacco: Never Used  . Tobacco comment: Patient is participating in health coaching for smoking cessation.   Vaping Use  . Vaping Use: Never used  Substance and Sexual Activity  . Alcohol use: No  . Drug use: No  . Sexual activity: Not on file  Other Topics Concern  . Not on file  Social History Narrative   Right handed      Highest level of edu- 12th grade      7 children      Apartment- 2nd floor   Social Determinants of Health   Financial Resource Strain: High Risk  . Difficulty of Paying Living Expenses: Very hard  Food Insecurity: No Food Insecurity  . Worried About Programme researcher, broadcasting/film/video in the Last Year: Never true  . Ran Out of Food in the Last Year: Never true  Transportation Needs: No Transportation Needs  . Lack of Transportation (Medical): No  . Lack of  Transportation (Non-Medical): No  Physical Activity: Inactive  . Days of Exercise per Week: 0 days  . Minutes of Exercise per Session: 0 min  Stress: Stress Concern Present  . Feeling of Stress : Rather much  Social Connections: Not on file  Intimate Partner Violence: Not on file    Current Outpatient Medications on File Prior to Visit  Medication Sig Dispense Refill  . amLODipine (NORVASC) 5 MG tablet Take 1 tablet (5 mg total) by mouth daily. 90 tablet 3  . aspirin (EC-81 ASPIRIN) 81 MG EC tablet Take 1 tablet (81 mg total) by mouth daily. Swallow whole. 30 tablet 12  . atorvastatin (LIPITOR) 40 MG tablet Take 1 tablet (40 mg total) by mouth daily. 90 tablet 3  . chlorthalidone (HYGROTON) 25 MG tablet Take 1 tablet (25 mg total) by mouth daily. 90 tablet 3  . hydroxychloroquine (PLAQUENIL) 200 MG tablet Take 1 tablet by mouth 2 (two) times a day.    . irbesartan (AVAPRO) 300 MG tablet Take 1 tablet (300 mg total) by mouth daily. 90 tablet 3  . pregabalin (LYRICA) 150 MG capsule Take 1 capsule (150 mg total) by mouth 2 (two) times daily. 180 capsule 1  . RINVOQ 15 MG  TB24 Take 1 tablet by mouth daily.     No current facility-administered medications on file prior to visit.    Allergies  Allergen Reactions  . Labetalol Anxiety    Headaches Headaches    Family History  Problem Relation Age of Onset  . Hypertension Mother   . Hypertension Maternal Grandmother   . Lupus Maternal Grandmother   . Heart failure Maternal Grandmother   . Stroke Maternal Grandmother   . CAD Maternal Grandmother   . Adrenal disorder Neg Hx     BP (!) 220/120 (BP Location: Right Arm, Patient Position: Sitting, Cuff Size: Normal)   Pulse 81   Ht 5\' 4"  (1.626 m)   Wt 175 lb 9.6 oz (79.7 kg)   SpO2 98%   BMI 30.14 kg/m   Review of Systems Pt reports weight gain.      Objective:   Physical Exam VITAL SIGNS:  See vs page GENERAL: no distress EXT: trace bilat leg edema.   Lab Results   Component Value Date   CREATININE 0.89 01/23/2020   BUN 7 01/23/2020   NA 140 01/23/2020   K 4.1 01/23/2020   CL 103 01/23/2020   CO2 25 01/23/2020       Assessment & Plan:  HTN: uncontrolled   Patient Instructions  I have sent a prescription to your pharmacy, to increase the spironolactone. A 24HR urine test is requested for you today.  We'll let you know about the results.  Please come back for a follow-up appointment in 1 month.

## 2020-04-16 NOTE — Progress Notes (Signed)
Appointment Outcome:  Completed, Session #: 16  AGREEMENTS SECTION   Overall Goal(s): Smoking cessation Stress management Manage hypertension Decrease caffeine consumption                                                  Agreement/Action Steps:  Smoking cessation Take Wellbutrin as prescribed Drink water to manage cravings   Stress management Maintain healthy boundaries with family and friends   Manage hypertension Check blood pressure and record reading daily (Wed & Sat and 2 days of choice during the week before bedtime) Take HTN medication as prescribed daily   Decrease caffeine consumption Limit daily iced/regular coffee and soda consumption    Progress Notes:  Patient stated that she is frustrated from being sick. Patient stated that she is motivated to improve her health because of her granddaughter and possibly having the opportunity to care for her long-term. Patient has been overwhelmed in the past two weeks, because her focus is on her granddaughter' condition and husband since finding out he is ill.  Patient shared that she checked her blood pressure this past Saturday and the reading was 150/120. Patient is concerned that even with taking the new medications for her blood pressure as prescribed, it has not improved. Patient reported that she is checking and recording her blood pressure as instructed on the schedule agreed upon after being home for some time after work. She also stated that she takes her blood medications daily as prescribed but at night before bed.   Patient stated that she is taking Wellbutrin as prescribed. She sees a difference in her mood because she is calmer. She mentioned that she isn't sure if it is helping with the cravings. Patient admitted that she drinks water, but not every day. She stated that it does not help with managing cravings. The patient shared that she puts lemon juice in the water and drink it for heartburn in addition to vinegar  and water.   Patient stated that she is smoking approximately 10-12 cigarettes per day. She takes 5 cigarettes to work (smoke 2 going to work, 1 on break, and two coming home). Patient stated that she smokes more at home because her husband smokes and this is where she is most stressed. Patient reported that after work she comes home and sleeps until 6:00pm and have at least one cigarette per hour until she goes to bed around 10:00pm. Patient stated that she use to smoke in the middle of the night, but no longer wake up and smoke.   Patient stated that some days are better than others when it comes to reducing caffeine. On some days, she doesn't feel like drinking coffee. She has found that approximately 3 days a week she goes without caffeine. Patient mentioned that she is not drinking much iced coffee because of the change in the taste.   Patient reported that she continues to enforce healthy boundaries with family and friends. However, her boundaries are not being respected. Patient has found herself having to break things down to others and having to be Patient stated that she even set ground rules for receiving phone calls from her sons, but they do not adhere to those rules. Patient shared that she has started turning her phone off or putting it on mute to minimize others stressing her out. Patient mentioned that she  has been talking to friends and family, especially her good friend for support and it has helped her tremendously through these last two weeks to process her emotions regarding family issues. Patient stated that this has helped her manage her stress better.   . Indicators of Success and Accountability:  Patient stated that her successes and accountability was the reduction in caffeine and checking her blood pressure and taking her HTN medications every day.  . Readiness: How ready are you to make the changes you have identified?  Rate your readiness on a 1-5 scale with 5 being the  readiest.  . Strengths and Supports: Patient has a close friend that supports her. Patient stated that she is motivated/determined by her desire not to be sick.  . Challenges and Barriers: Patient may be challenged by stress associated with several family issues and her own health issues.  Coaching Outcomes: At the end of the session, the patient was asked to think about what she can do for herself to decompress daily. Patient is unsure at the time. It was suggested by Care Guide that patient spend 30 minutes to 1 hour engaging in a relaxing activity of her choice. Patient stated that she tried that and found that she still couldn't relax. Patient was encouraged to continue to utilize her support in the meantime and that this topic will be revisited in the next session.  Patient had to end session to attend another appointment. In follow up session, Care Guide will discuss with patient smoking cessation strategies that include either returning to using the nicotine patch, considering nicotine gum, or reducing the number of cigarettes she smokes daily (10-12) to a lower range such as 8-10 cigarettes per day.  Patient will aim to drink water daily and continue to reduce caffeine consumption more than 3 days consecutively.   Patient will use positive self-talk when others disrespect her boundaries to encourage herself to help minimize stress that arise from these encounters, in addition to talking out her feelings with those that violate her boundaries in a positive manner. Patient will continue to implement all other action steps as outlined above.   Attempted: Marland Kitchen Fulfilled - Patient is taking Wellbutrin as prescribed, maintaining healthy boundaries with family and friends, checking blood pressure as scheduled, taking HTN medications as prescribed daily, and has decreased caffeine consumption. . Partial - Patient drinks water but not every day.

## 2020-04-25 MED ORDER — PREGABALIN 200 MG PO CAPS: 200.0000 mg | ORAL_CAPSULE | Freq: Two times a day (BID) | ORAL | 5 refills | Status: DC

## 2020-04-30 ENCOUNTER — Ambulatory Visit: Payer: 59

## 2020-04-30 ENCOUNTER — Telehealth: Payer: Self-pay

## 2020-04-30 DIAGNOSIS — Z0000 Encounter for general adult medical examination without abnormal findings: Secondary | ICD-10-CM

## 2020-04-30 NOTE — Telephone Encounter (Signed)
Patient called to reschedule health coaching appointment. Patient requested appointment for 05/06/20. Appointment is scheduled for 9:45am on 05/06/20.

## 2020-04-30 NOTE — Telephone Encounter (Signed)
Called patient to touch basis regarding health coaching session scheduled for today at 10:45am. Patient emailed Care Guide at 5:05am stating that she needed to cancel her appointment because her car was repossessed yesterday. Was not able to leave a message. Will attempt to contact her via phone again tomorrow. Also replied to the email as well informing the patient to call Care Guide at [phone] to reschedule.  Mentioned it to CSW, which recommended transportation services.

## 2020-04-30 NOTE — Progress Notes (Deleted)
HPI:  Denise Carter is a 50 y.o. female patient of Dr Duke Salvia, who presents today for advanced hypertension clinic follow up.  Her past medical history is significant only for non-cardiac issues including RA, Sjogren's disease and SLE.  Previously she had problems with several medications (see below), but much of it sounds to be related to just lack of effective combinations/doses. Dr. Duke Salvia set her up to see our Care Guide, enrolled her in [facility] exercise program and did blood work to check metanephrines, renin/aldosterone and hypokalemia.  She was given a blood pressure cuff and registered with [facility]. Patient also completed her sleep study was recommended for CPAP by Dr. Mayford Knife.   Ms Nam has been seen multiple times by Dr. Duke Salvia, Dr Penni Homans and myself, but still has been unable to achieve anything close to BP goals.  We verify refill history with her pharmacy. Patient continues to have compliance issues, and never picked up her last prescription for chlorthalidone. She is taking irbesartan 150mg  instead of 300mg  for unknown reason as well. Patient increased the amount of cigarettes per day , but is trying to decrease tobacco use again. Patient has appoiment with care guide today right after this visit.  Noted new diagnosed varicosis disease on left leg.   Secondary cause testing: Pheochromocytoma 4/21:Metanephrines WNL  hypoaldosteronism 4/21:Renin low (<0.167), aldosterone WNL, aldost/renin ratio WNL  Sleep apnea 8/21: OSA , CPAP titration recommended  Renovascular disease 12/19: ultrasound WNL     Blood Pressure Goal:  130/80  Current Medications: Irbesartan 300mg  daily - taking 150mg  instead (morning 11:30am) amlodipine 5 mg daily - evening after supper (8pm) spironolactone 50 mg daily - evening chlorthalidone 12.5mg  daily - NOT TAKING (never picked up from pharmacy)  Family Hx: mother died at 64 cervical caner, mgp both with hypertension; father died at 21 due  to drug issues; no whole sibblings; 7 chidren oldest 61 - no known issues for any at this time  Social Hx: still smoking pack per day - is trying to quit - on bupropion, but it does not appear to be cutting back her cravings; no alcohol; caffeine daily (multiple cups of coffee and Coke throughout the day)  Diet: low appetite due to meds, notes lost appetite after d/c prednisone about 8-9 months ago (d/c from 20 mg to 10 mg the d/c) - mostly coffee diet right now - however hasn't lost any weight, tried decaf but back on regular  Exercise: [facility] doesn't have any evening schedules, confilcts with work (5 am-10-12 pm at ) - unable to due to work and family struggles  Home BP readings:  9 readings, average 152/112, HR range 85-97 bpm  Intolerances: Labetalol - HA HCTZ - HA Valsartan - diarrhea Amlodipine causes headaches at 10 mg Hydralazine - headaches  Labs: 12/21:  Na 140, K 4.1, Glu 83, BUN 7, SCr 0.89 GFR 87   Wt Readings from Last 3 Encounters:  04/16/20 175 lb 9.6 oz (79.7 kg)  03/17/20 177 lb (80.3 kg)  03/04/20 181 lb 3.2 oz (82.2 kg)   BP Readings from Last 3 Encounters:  04/16/20 (!) 220/120  03/17/20 (!) 157/119  03/04/20 (!) 176/108   Pulse Readings from Last 3 Encounters:  04/16/20 81  03/17/20 99  03/04/20 88    Current Outpatient Medications  Medication Sig Dispense Refill  . amLODipine (NORVASC) 5 MG tablet Take 1 tablet (5 mg total) by mouth daily. 90 tablet 3  . aspirin (EC-81 ASPIRIN)  81 MG EC tablet Take 1 tablet (81 mg total) by mouth daily. Swallow whole. 30 tablet 12  . atorvastatin (LIPITOR) 40 MG tablet Take 1 tablet (40 mg total) by mouth daily. 90 tablet 3  . chlorthalidone (HYGROTON) 25 MG tablet Take 1 tablet (25 mg total) by mouth daily. 90 tablet 3  . hydroxychloroquine (PLAQUENIL) 200 MG tablet Take 1 tablet by mouth 2 (two) times a day.    . irbesartan (AVAPRO) 300 MG tablet Take 1 tablet (300 mg total) by mouth daily. 90  tablet 3  . pregabalin (LYRICA) 200 MG capsule Take 1 capsule (200 mg total) by mouth 2 (two) times daily. 60 capsule 5  . RINVOQ 15 MG TB24 Take 1 tablet by mouth daily.    Marland Kitchen spironolactone (ALDACTONE) 100 MG tablet Take 1 tablet (100 mg total) by mouth daily. 90 tablet 3   No current facility-administered medications for this visit.    Allergies  Allergen Reactions  . Labetalol Anxiety    Headaches Headaches    Past Medical History:  Diagnosis Date  . Atypical chest pain 05/23/2019  . Essential hypertension 05/23/2019  . Hypertension   . Lupus (HCC)   . OSA (obstructive sleep apnea) 11/19/2019  . Shortness of breath 05/23/2019  . Snoring 05/23/2019  . Thyroid disease   . Varicose veins of left lower extremity 01/26/2020    There were no vitals taken for this visit.  No problem-specific Assessment & Plan notes found for this encounter.  Moustapha Tooker Rodriguez-Guzman PharmD, BCPS, CPP [facility] [address] [place] [address] 04/30/2020 11:28 AM

## 2020-05-06 ENCOUNTER — Ambulatory Visit: Payer: 59

## 2020-05-12 ENCOUNTER — Ambulatory Visit: Payer: 59

## 2020-05-26 ENCOUNTER — Other Ambulatory Visit: Payer: Self-pay | Admitting: Internal Medicine

## 2020-05-26 DIAGNOSIS — Z1231 Encounter for screening mammogram for malignant neoplasm of breast: Secondary | ICD-10-CM

## 2020-05-28 ENCOUNTER — Other Ambulatory Visit: Payer: Self-pay

## 2020-05-28 ENCOUNTER — Ambulatory Visit: Payer: 59 | Admitting: Endocrinology

## 2020-05-28 ENCOUNTER — Ambulatory Visit (INDEPENDENT_AMBULATORY_CARE_PROVIDER_SITE_OTHER): Payer: 59 | Admitting: Internal Medicine

## 2020-05-28 ENCOUNTER — Encounter: Payer: Self-pay | Admitting: Internal Medicine

## 2020-05-28 VITALS — BP 167/131 | HR 91 | Resp 16 | Wt 173.0 lb

## 2020-05-28 DIAGNOSIS — M329 Systemic lupus erythematosus, unspecified: Secondary | ICD-10-CM | POA: Diagnosis not present

## 2020-05-28 DIAGNOSIS — I10 Essential (primary) hypertension: Secondary | ICD-10-CM

## 2020-05-28 DIAGNOSIS — J3089 Other allergic rhinitis: Secondary | ICD-10-CM | POA: Diagnosis not present

## 2020-05-28 MED ORDER — FLUTICASONE PROPIONATE 50 MCG/ACT NA SUSP: 2.0000 | Freq: Every day | NASAL | 6 refills | Status: DC

## 2020-05-28 NOTE — Progress Notes (Signed)
Pt would like to talk with provider about getting tested for MS and Allergies   Pt states she takes her bp medicine in the morning but it doesn't start to work till in the afternoon

## 2020-05-28 NOTE — Progress Notes (Signed)
Subjective:    Denise Carter - 50 y.o. female MRN [id number]  Date of birth: 1970/06/30  HPI  Denise Carter is here for follow up. She has a history of resistant HTN. Takes Chlorthalidone 25 mg, Amlodipine 5 mg, Spironolactone 100 mg, Irbesartan 300 mg. Reports Spironolactone was increased from 50 to 100 mg recently. Saw temporary improvement in BP but then again became elevated. Followed very closely by cardiology. No chest pain, vision changes, severe headaches.   Believes she has a history of SLE. Asks to have ANA checked as this was not done recently by her current rheumatologist.   Believes she has severe environmental and food allergies and would like to be tested for this.         Health Maintenance:  Health Maintenance Due  Topic Date Due  . TETANUS/TDAP  Never done  . MAMMOGRAM  Never done    -  reports that she has been smoking cigarettes. She has been smoking about 0.25 packs per day. She has never used smokeless tobacco. - Review of Systems: Per HPI. - Past Medical History: Patient Active Problem List   Diagnosis Date Noted  . OSA (obstructive sleep apnea) 11/19/2019  . Thyroid nodule 07/23/2019  . Atypical chest pain 05/23/2019  . Resistant hypertension 05/23/2019  . Snoring 05/23/2019  . Rheumatoid arthritis without rheumatoid factor, multiple sites (HCC) 06/22/2018  . Polyarthralgia 06/22/2018  . Sjogren's disease (HCC) 06/22/2018  . Polyclonal gammopathy determined by serum protein electrophoresis 01/30/2017   - Medications: reviewed and updated   Objective:   Physical Exam BP (!) 167/131 (BP Location: Right Arm, Patient Position: Sitting, Cuff Size: Normal)   Pulse 91   Resp 16   Wt 173 lb (78.5 kg)   SpO2 98%   BMI 29.70 kg/m  Physical Exam Constitutional:      General: She is not in acute distress.    Appearance: She is not diaphoretic.  HENT:     Head: Normocephalic and atraumatic.  Eyes:     Conjunctiva/sclera: Conjunctivae  normal.  Cardiovascular:     Rate and Rhythm: Normal rate and regular rhythm.     Heart sounds: Normal heart sounds. No murmur heard.   Pulmonary:     Effort: Pulmonary effort is normal. No respiratory distress.     Breath sounds: Normal breath sounds.  Musculoskeletal:        General: Normal range of motion.  Skin:    General: Skin is warm and dry.  Neurological:     Mental Status: She is alert and oriented to person, place, and time.  Psychiatric:        Mood and Affect: Affect normal.        Judgment: Judgment normal.            Assessment & Plan:   1. Environmental and seasonal allergies Discussed use of Flonase consistently to help with environmental allergies as is taking inconsistently. She reports she believes she has an intolerance/allergy to multiple foods and would like to be tested as she is afraid to eat a lot of things.  - Food Allergy Profile - Allergens w/Comp Rflx Area 2 - fluticasone (FLONASE) 50 MCG/ACT nasal spray; Place 2 sprays into both nostrils daily.  Dispense: 16 g; Refill: 6  2. History of systemic lupus erythematosus (SLE) (HCC) - ANA  3. Resistant hypertension BP extremely hard to control. She is monitored closely by cardiology team. Will defer to specialist for medication management. She is asymptomatic.  Marcy Siren, D.O. 05/30/2020, 11:54 AM Primary Care at [facility]

## 2020-05-29 ENCOUNTER — Ambulatory Visit: Payer: 59 | Admitting: Vascular Surgery

## 2020-05-29 ENCOUNTER — Other Ambulatory Visit: Payer: 59

## 2020-06-02 LAB — SPECIMEN STATUS REPORT

## 2020-06-04 ENCOUNTER — Ambulatory Visit: Payer: 59 | Admitting: Internal Medicine

## 2020-06-05 ENCOUNTER — Ambulatory Visit (INDEPENDENT_AMBULATORY_CARE_PROVIDER_SITE_OTHER): Payer: 59 | Admitting: Endocrinology

## 2020-06-05 ENCOUNTER — Other Ambulatory Visit: Payer: 59

## 2020-06-05 ENCOUNTER — Other Ambulatory Visit: Payer: Self-pay

## 2020-06-05 VITALS — BP 160/110 | HR 85 | Ht 64.0 in | Wt 176.8 lb

## 2020-06-05 DIAGNOSIS — I10 Essential (primary) hypertension: Secondary | ICD-10-CM

## 2020-06-05 DIAGNOSIS — E041 Nontoxic single thyroid nodule: Secondary | ICD-10-CM

## 2020-06-05 LAB — BASIC METABOLIC PANEL
BUN: 11 mg/dL (ref 6–23)
CO2: 28 mEq/L (ref 19–32)
Calcium: 9.1 mg/dL (ref 8.4–10.5)
Chloride: 104 mEq/L (ref 96–112)
Creatinine, Ser: 0.91 mg/dL (ref 0.40–1.20)
GFR: 73.18 mL/min (ref >60.00)
Glucose, Bld: 85 mg/dL (ref 70–99)
Potassium: 3.9 mEq/L (ref 3.5–5.1)
Sodium: 138 mEq/L (ref 135–145)

## 2020-06-05 LAB — FOOD ALLERGY PROFILE
Allergen Corn, IgE: <0.1 kU/L
Clam IgE: <0.1 kU/L
Codfish IgE: <0.1 kU/L
Egg White IgE: <0.1 kU/L
Milk IgE: <0.1 kU/L
Peanut IgE: <0.1 kU/L
Scallop IgE: <0.1 kU/L
Sesame Seed IgE: <0.1 kU/L
Shrimp IgE: 1.02 kU/L — AB
Soybean IgE: <0.1 kU/L
Walnut IgE: <0.1 kU/L
Wheat IgE: <0.1 kU/L

## 2020-06-05 LAB — ALLERGENS W/COMP RFLX AREA 2
Alternaria Alternata IgE: <0.1 kU/L
Aspergillus Fumigatus IgE: <0.1 kU/L
Bermuda Grass IgE: <0.1 kU/L
Cedar, Mountain IgE: <0.1 kU/L
Cladosporium Herbarum IgE: <0.1 kU/L
Cockroach, German IgE: 0.48 kU/L — AB
Common Silver Birch IgE: <0.1 kU/L
Cottonwood IgE: <0.1 kU/L
D Farinae IgE: 0.34 kU/L — AB
D Pteronyssinus IgE: 0.33 kU/L — AB
E001-IgE Cat Dander: 0.86 kU/L — AB
E005-IgE Dog Dander: 0.34 kU/L — AB
Elm, American IgE: <0.1 kU/L
IgE (Immunoglobulin E), Serum: 88 IU/mL (ref 6–495)
Johnson Grass IgE: <0.1 kU/L
Maple/Box Elder IgE: <0.1 kU/L
Mouse Urine IgE: <0.1 kU/L
Oak, White IgE: <0.1 kU/L
Pecan, Hickory IgE: <0.1 kU/L
Penicillium Chrysogen IgE: <0.1 kU/L
Pigweed, Rough IgE: <0.1 kU/L
Ragweed, Short IgE: <0.1 kU/L
Sheep Sorrel IgE Qn: <0.1 kU/L
Timothy Grass IgE: <0.1 kU/L
White Mulberry IgE: <0.1 kU/L

## 2020-06-05 LAB — PANEL 606578
E094-IgE Fel d 1: 0.86 kU/L — AB
E220-IgE Fel d 2: <0.1 kU/L
E228-IgE Fel d 4: <0.1 kU/L

## 2020-06-05 LAB — ALLERGEN COMPONENT COMMENTS

## 2020-06-05 LAB — ANA: Anti Nuclear Antibody (ANA): POSITIVE — AB

## 2020-06-05 LAB — TSH: TSH: 1.82 u[IU]/mL (ref 0.35–4.50)

## 2020-06-05 MED ORDER — SPIRONOLACTONE 100 MG PO TABS: 200.0000 mg | ORAL_TABLET | Freq: Every day | ORAL | 3 refills | Status: DC

## 2020-06-05 NOTE — Progress Notes (Signed)
Subjective:    Patient ID: Denise Carter, female    DOB: 1970-09-04, 50 y.o.   MRN: [id number]  HPI Pt returns for f/u of elev aldo/PRA ratio (dx'ed 2021; rx'ed with aldactone) She also has thyroid nodule (dx'ed 2018; f/u US in 2021 advised f/u in 2023; she is euthyroid off rx).   Pt says she sometimes misses BP rx, despite low copays.  She returned urine specimen today.  Past Medical History:  Diagnosis Date  . Atypical chest pain 05/23/2019  . Essential hypertension 05/23/2019  . Hypertension   . Lupus (HCC)   . OSA (obstructive sleep apnea) 11/19/2019  . Shortness of breath 05/23/2019  . Snoring 05/23/2019  . Thyroid disease   . Varicose veins of left lower extremity 01/26/2020    Past Surgical History:  Procedure Laterality Date  . ABDOMINAL HYSTERECTOMY      Social History   Socioeconomic History  . Marital status: Married    Spouse name: Not on file  . Number of children: Not on file  . Years of education: Not on file  . Highest education level: Not on file  Occupational History  . Not on file  Tobacco Use  . Smoking status: Current Every Day Smoker    Packs/day: 0.25    Types: Cigarettes  . Smokeless tobacco: Never Used  . Tobacco comment: Patient is participating in health coaching for smoking cessation.   Vaping Use  . Vaping Use: Never used  Substance and Sexual Activity  . Alcohol use: No  . Drug use: No  . Sexual activity: Not on file  Other Topics Concern  . Not on file  Social History Narrative   Right handed      Highest level of edu- 12th grade      7 children      Apartment- 2nd floor   Social Determinants of Health   Financial Resource Strain: High Risk  . Difficulty of Paying Living Expenses: Very hard  Food Insecurity: No Food Insecurity  . Worried About Programme researcher, broadcasting/film/video in the Last Year: Never true  . Ran Out of Food in the Last Year: Never true  Transportation Needs: No Transportation Needs  . Lack of Transportation  (Medical): No  . Lack of Transportation (Non-Medical): No  Physical Activity: Not on file  Stress: Stress Concern Present  . Feeling of Stress : Rather much  Social Connections: Not on file  Intimate Partner Violence: Not on file    Current Outpatient Medications on File Prior to Visit  Medication Sig Dispense Refill  . amLODipine (NORVASC) 5 MG tablet Take 1 tablet (5 mg total) by mouth daily. 90 tablet 3  . aspirin (EC-81 ASPIRIN) 81 MG EC tablet Take 1 tablet (81 mg total) by mouth daily. Swallow whole. 30 tablet 12  . atorvastatin (LIPITOR) 40 MG tablet Take 1 tablet (40 mg total) by mouth daily. 90 tablet 3  . chlorthalidone (HYGROTON) 25 MG tablet Take 1 tablet (25 mg total) by mouth daily. 90 tablet 3  . fluticasone (FLONASE) 50 MCG/ACT nasal spray Place 2 sprays into both nostrils daily. 16 g 6  . hydroxychloroquine (PLAQUENIL) 200 MG tablet Take 1 tablet by mouth 2 (two) times a day.    . irbesartan (AVAPRO) 300 MG tablet Take 1 tablet (300 mg total) by mouth daily. 90 tablet 3  . pregabalin (LYRICA) 200 MG capsule Take 1 capsule (200 mg total) by mouth 2 (two) times daily. 60 capsule 5  .  RINVOQ 15 MG TB24 Take 1 tablet by mouth daily.     No current facility-administered medications on file prior to visit.    Allergies  Allergen Reactions  . Labetalol Anxiety    Headaches Headaches    Family History  Problem Relation Age of Onset  . Hypertension Mother   . Hypertension Maternal Grandmother   . Lupus Maternal Grandmother   . Heart failure Maternal Grandmother   . Stroke Maternal Grandmother   . CAD Maternal Grandmother   . Adrenal disorder Neg Hx     BP (!) 160/110 (BP Location: Right Arm, Patient Position: Sitting, Cuff Size: Normal)   Pulse 85   Ht 5\' 4"  (1.626 m)   Wt 176 lb 12.8 oz (80.2 kg)   SpO2 99%   BMI 30.35 kg/m   Review of Systems Denies sob.      Objective:   Physical Exam VITAL SIGNS:  See vs page GENERAL: no distress Ext: no leg edema.        Assessment & Plan:  HTN: uncontrolled Noncompliance with meds.  She is not a candidate for BID aldactone dosing.    Patient Instructions  I have sent a prescription to your pharmacy, to increase the spironolactone again. Blood tests are requested for you today.  We'll let you know about the results of this and the urine tests.   Please come back for a follow-up appointment in 2 months.

## 2020-06-05 NOTE — Telephone Encounter (Signed)
Patient has had follow up since message 

## 2020-06-05 NOTE — Patient Instructions (Addendum)
I have sent a prescription to your pharmacy, to increase the spironolactone again. Blood tests are requested for you today.  We'll let you know about the results of this and the urine tests.   Please come back for a follow-up appointment in 2 months.

## 2020-06-06 ENCOUNTER — Telehealth: Payer: Self-pay | Admitting: Orthopaedic Surgery

## 2020-06-06 NOTE — Telephone Encounter (Signed)
Can you please help with this ?

## 2020-06-06 NOTE — Telephone Encounter (Signed)
Pt called and [facility]: [facility] and they told her it would be better if you requested her records. She states she already signed the release form.

## 2020-06-11 ENCOUNTER — Telehealth: Payer: Self-pay

## 2020-06-11 ENCOUNTER — Telehealth: Payer: Self-pay | Admitting: Licensed Clinical Social Worker

## 2020-06-11 DIAGNOSIS — Z0000 Encounter for general adult medical examination without abnormal findings: Secondary | ICD-10-CM

## 2020-06-11 LAB — METANEPHRINES, URINE, 24 HOUR
METANEPHRINE: 157 mcg/24 h (ref 90–315)
METANEPHRINES, TOTAL: 310 mcg/24 h (ref 224–832)
NORMETANEPHRINE: 153 mcg/24 h (ref 122–676)
Total Volume: 1250 mL

## 2020-06-11 LAB — CATECHOLAMINES, FRACTIONATED, URINE, 24 HOUR
Calc Total (E+NE): 34 mcg/24 h (ref 26–121)
Creatinine, Urine mg/day-CATEUR: 1.01 g/(24.h) (ref 0.50–2.15)
Dopamine 24 Hr Urine: 288 mcg/24 h (ref 52–480)
Norepinephrine, 24H, Ur: 34 mcg/24 h (ref 15–100)
Total Volume: 1250 mL

## 2020-06-11 NOTE — Telephone Encounter (Signed)
Health Coach referred pt e-mail to her to this Clinical research associate, pt shares she applied for assistance through [employer], inquiring if I can check on her application. Unfortunately, I do not have a contact directly at [facility] and recommended that Health Coach direct her to reach out directly via listed phone/email on their website.  Remain available as needed for any additional questions/concerns.  Octavio Graves, MSW, LCSW [facility] Navigation  [phone]

## 2020-06-11 NOTE — Telephone Encounter (Signed)
Called patient to get her scheduled for a health coaching session. Patient has been scheduled for an in-person session on 06/18/20 at 11:00am.  Patient also discussed going to [employer] today to complete an application for housing assistance. Patient is scheduled for court on Monday 06/16/20 with rental property. Patient stated that it takes two weeks to receive a decision. Patient stated she is not sure what the outcome may be but is preparing to move in the event that she is forced to do so. Care Guide will update [name], regarding the patient's housing concerns.

## 2020-06-13 NOTE — Telephone Encounter (Signed)
Faxed a request.

## 2020-06-18 ENCOUNTER — Ambulatory Visit (INDEPENDENT_AMBULATORY_CARE_PROVIDER_SITE_OTHER): Payer: 59 | Admitting: Orthopaedic Surgery

## 2020-06-18 ENCOUNTER — Other Ambulatory Visit: Payer: Self-pay

## 2020-06-18 ENCOUNTER — Encounter: Payer: Self-pay | Admitting: Orthopaedic Surgery

## 2020-06-18 ENCOUNTER — Ambulatory Visit (INDEPENDENT_AMBULATORY_CARE_PROVIDER_SITE_OTHER): Payer: 59

## 2020-06-18 DIAGNOSIS — Z0000 Encounter for general adult medical examination without abnormal findings: Secondary | ICD-10-CM

## 2020-06-18 DIAGNOSIS — M47816 Spondylosis without myelopathy or radiculopathy, lumbar region: Secondary | ICD-10-CM

## 2020-06-18 NOTE — Progress Notes (Signed)
Appointment Outcome:  Completed, Session #: 17 Start time: 12:39pm   End time: 1:20pm   Total Mins: 41 minutes   AGREEMENTS SECTION   Overall Goal(s): Smoking cessation Stress management Manage hypertension Decrease caffeine consumption                                                  Agreement/Action Steps:  Smoking cessation Take Wellbutrin as prescribed Drink water to manage cravings   Stress management Maintain healthy boundaries with family and friends   Manage hypertension Check blood pressure and record reading daily (Wed & Sat and 2 days of choice during the week before bedtime) Take HTN medication as prescribed daily   Decrease caffeine consumption Limit daily iced/regular coffee and soda consumption    Progress Notes:  Patient stated that she has resumed smoking a pack of cigarettes per day again. Patient has not been taking Wellbutrin as prescribed or her hypertension medications. Patient feels as though none of her medications are working and she doesn't see the benefit of taking them. Patient was asked if she had tried using the nicotine patches, but patient has not done so at this time. Patient has not been consistently tracking her blood pressure via a log as instructed.  Patient has not been drinking as much water but has limited her consumption of caffeine. Water does not really help curb cravings for nicotine. Patient has cut down coffee in the mornings because it bothers her stomach at times. Patient only finds herself drinking a soda when she experiences acid reflux and wants to burp. Patient has reduced the size of the soda she does drink to a mini can.   Patient has been having some issues with her appetite as well. Due to acid reflux, patient stated that she cannot eat beef, she recently learned that she is allergic to shrimp, cannot eat various sauces, but crave things with salt such as chips.   Patient has had difficulty maintaining healthy boundaries to  reduce stress. Patient shared how family concerns makes it challenging to not be engaged in various situations although she tries to set boundaries, they are not always respected. Patient  Patient is mainly focused on trying to figure out resources to aid in stopping being evicted from her apartment. Patient stated that she has 10 days as of 06/16/20 to submit full payment or to move. Patient applied to the [employer] where her application was rejected. Patient was instructed to contact the [employer]. Patient was informed by the [employer] they are not accepting assistance applications at this time. Patient currently owes $2807 in rent and fees.    Indicators of Success and Accountability: Patient was working to minimize  her caffeine consumption . Readiness: Patient is no longer in the action phase and has moved back into  the contemplation phase.  . Supports: Patient is being supported by a friend.  . Challenges and Barriers: Patient is being challenged by other competing  priorities and personal needs. Patient is dealing with a lot of stress.   Coaching Outcomes: Patient stated that she is not ready to quit smoking at this time. Patient wants to focus on finding a solution to her housing issue.  Patient provided a copy of her eviction notice to be shared with the LCSW. Patient was provided with the contact information to  [employer].   Patient was encouraged to contact Care Guide when she is ready to work on health goals again. Patient agreed with offer. Health coaching contract has ended as of 06/18/20 at 1:20pm.   Not attempted: Marland Kitchen Deferred - Patient expressed the agreement is important but unable to work towards this week and want to continue the path with this agreement. i.e., deferring to another time, or patient determined not an appropriate time to try due to personal needs and priorities.

## 2020-06-18 NOTE — Progress Notes (Addendum)
Office Visit Note   Patient: Denise Carter           Date of Birth: Jun 09, 1970           MRN: [id number] Visit Date: 06/18/2020              Requested by: Arvilla Market, DO [address] [place],  [place] [address] PCP: Arvilla Market, DO   Assessment & Plan: Visit Diagnoses:  1. Facet arthritis of lumbar region     Plan: Long discussion with patient concerning her diagnosis by rheumatologist and at this point its not exactly been determined.  We discussed with her that sometimes patients have borderline lab results which later are easier to determine exactly what the condition is.  There is some type of rheumatologic condition which is present and she does have some facet fluid noted from L3-S1 of varying degrees but no severe central or foraminal or lateral recess stenosis.  Patient has had facet injections that may have given her temporary relief for a few days.  She has been on prednisone, Naprosyn, hydroxychloroquine, Rasuvo injections.  I discussed with her at this point I do not think additional injections or facet rizotomy is indicated since she has been through these before without significant relief.  She does not have nerve compression and I do not think surgery is indicated at this point.  I discussed them happy to recheck her in 3 to 6 months if she has ongoing symptoms.  She has some type of autoimmune condition with some increased facet fluid in the lumbar spine L3-S1 not severe but likely enough to give her ongoing symptoms.  She has been physically active is continuing to work and is avoiding narcotic medication.  We discussed frequently patients with more time the exact condition may be more easily determined and therapy can be adjusted and sometimes becomes more effective based on the exact diagnosis.  She understands the rheumatologist is working hard to try to give her best maximum treatment for her condition.  Recheck 3 to 6 months if she has  ongoing symptoms.  Follow-Up Instructions: No follow-ups on file.   Orders:  No orders of the defined types were placed in this encounter.  No orders of the defined types were placed in this encounter.     Procedures: No procedures performed   Clinical Data: No additional findings.   Subjective: Chief Complaint  Patient presents with  . Lower Back - Pain    HPI 50 year old female first-time visit with history of chronic back pain.  She was initially seen in [place] was on pain management for extended length of time.  She had injections in her back including facet rhizotomy on the right side which did not really help she did not have the left side done.  She has some type of autoimmune disease originally diagnosed possible rheumatoid variant also called autoimmune condition.  She had a positive ANA.  Recent new imaging studies lumbar spine.  She states she has back pain that is in the lumbar spine midline radiates over the sacroiliac region.  She is taking Lyrica which sometimes makes her sleepy at night.  She has had few prescriptions for hydrocodone 30 tablets last was in June 2021 1 year ago.  She has been using Lyrica which seems to help to some degree.  She is followed by rheumatologist.  She is somewhat concerned that the exact rheumatologic diagnosis has not been found and the 2 rheumatologist  she saw one in [place] and one here had slightly different diagnosis.  Rheumatoid factor was negative.  Previous recent MRI shows some facet arthropathy at L3-4 some at L4-5 some at L5-S1.  No moderate or severe compression.  Patient has increased pain at night.  She is try to be physically active and her job involves walking lifting turning twisting.  Review of Systems positive for history of joint pain question Sjogren's question rheumatoid arthritis seronegative versus autoimmune disease variant.  History of hypertension all systems noncontributory to HPI.   Objective: Vital Signs:  BP (!) 161/109   Pulse 82   Ht 5\' 4"  (1.626 m)   Wt 173 lb (78.5 kg)   BMI 29.70 kg/m   Physical Exam Constitutional:      Appearance: She is well-developed.  HENT:     Head: Normocephalic.     Right Ear: External ear normal.     Left Ear: External ear normal.  Eyes:     Pupils: Pupils are equal, round, and reactive to light.  Neck:     Thyroid: No thyromegaly.     Trachea: No tracheal deviation.  Cardiovascular:     Rate and Rhythm: Normal rate.  Pulmonary:     Effort: Pulmonary effort is normal.  Abdominal:     Palpations: Abdomen is soft.  Skin:    General: Skin is warm and dry.  Neurological:     Mental Status: She is alert and oriented to person, place, and time.  Psychiatric:        Behavior: Behavior normal.     Ortho Exam patient has negative straight leg raising knee and ankle jerk is intact negative popliteal compression test.  No sciatic notch tenderness.  Mild tenderness over the SI joints right and left.  Mild tenderness over the paralumbar muscles.  Patient has good core strength with resisted testing.  Abductor strength quads are strong no atrophy.  Distal pulses are normal.  Specialty Comments:  No specialty comments available.  Imaging: No results found.   PMFS History: Patient Active Problem List   Diagnosis Date Noted  . Facet arthritis of lumbar region 06/18/2020  . OSA (obstructive sleep apnea) 11/19/2019  . Thyroid nodule 07/23/2019  . Atypical chest pain 05/23/2019  . Resistant hypertension 05/23/2019  . Snoring 05/23/2019  . Rheumatoid arthritis without rheumatoid factor, multiple sites (HCC) 06/22/2018  . Polyarthralgia 06/22/2018  . Sjogren's disease (HCC) 06/22/2018  . Polyclonal gammopathy determined by serum protein electrophoresis 01/30/2017   Past Medical History:  Diagnosis Date  . Atypical chest pain 05/23/2019  . Essential hypertension 05/23/2019  . Hypertension   . Lupus (HCC)   . OSA (obstructive sleep apnea) 11/19/2019   . Shortness of breath 05/23/2019  . Snoring 05/23/2019  . Thyroid disease   . Varicose veins of left lower extremity 01/26/2020    Family History  Problem Relation Age of Onset  . Hypertension Mother   . Hypertension Maternal Grandmother   . Lupus Maternal Grandmother   . Heart failure Maternal Grandmother   . Stroke Maternal Grandmother   . CAD Maternal Grandmother   . Adrenal disorder Neg Hx     Past Surgical History:  Procedure Laterality Date  . ABDOMINAL HYSTERECTOMY     Social History   Occupational History  . Not on file  Tobacco Use  . Smoking status: Current Every Day Smoker    Packs/day: 0.25    Types: Cigarettes  . Smokeless tobacco: Never Used  . Tobacco  comment: Patient is participating in health coaching for smoking cessation.   Vaping Use  . Vaping Use: Never used  Substance and Sexual Activity  . Alcohol use: No  . Drug use: No  . Sexual activity: Not on file

## 2020-06-20 ENCOUNTER — Telehealth: Payer: Self-pay | Admitting: Licensed Clinical Social Worker

## 2020-06-20 NOTE — Telephone Encounter (Addendum)
LATE ENTRY (from 5/26)  Health Coach shared with this writer that pt went to court regarding eviction, she offered to pay partial back payment to leasing agency. They are declining to take offer of payment currently from pt, due to multiple months of past due rent. Pt had received assistance from [employer] and [facility] funding per previous conversations to assist with rental payments from Sept 2021 to Feb 2022.   At the end of 2021/beginning of 2022 pt had expressed leasing agency already had started to express they were leaning toward legal action. Pt had been referred to [facility] and provided Dr. Chucky May' number in December and January through [facility] for Housing and [employer]- it is unclear whether they ever were connected.   LCSW offered the number for Eviction Mediation services and recommended pt still to call them again although at this time they may be limited in ability to assist. I also sent the formal flyer for that program to Health Coach. Conferred with fellow LCSW team and, unfortunately, pt back rent and additional expenses are too high for [employer] to be able to assist with at this time/leasing agency declining to accept partial payment. Recommended that pt reach out to friends and family to see if they would be able to allow pt/pt spouse to stay there until another apartment/home located. Due to shortage in affordable housing in [place] options are limited. Health Coach states she will share the above with pt, LCSW remains available to assist as needed.   Octavio Graves, MSW, LCSW [facility]  [phone]

## 2020-06-26 ENCOUNTER — Encounter: Payer: Self-pay | Admitting: Vascular Surgery

## 2020-06-26 ENCOUNTER — Other Ambulatory Visit: Payer: Self-pay

## 2020-06-26 ENCOUNTER — Ambulatory Visit (INDEPENDENT_AMBULATORY_CARE_PROVIDER_SITE_OTHER): Payer: 59 | Admitting: Vascular Surgery

## 2020-06-26 VITALS — BP 139/94 | HR 98 | Temp 98.2°F | Resp 20 | Ht 64.0 in | Wt 178.0 lb

## 2020-06-26 DIAGNOSIS — I83812 Varicose veins of left lower extremities with pain: Secondary | ICD-10-CM

## 2020-06-26 NOTE — Progress Notes (Signed)
Patient name: Denise Carter MRN: [id number] DOB: 06/30/1970 Sex: female   HPI: Denise Carter is a 50 y.o. female, with several month history of fullness heaviness aching in her left leg that is worse at the end of the day after standing all day at work.  She works at [employer] and is on her feet frequently.  She was seen by our PA clinic about 3 months ago and given a prescription for thigh-high lower extremity compression stockings.  She has not really achieved complete relief with these.  She has some swelling which is usually around the knee and thigh.  But occasionally she will also get swelling in the foot and ankle as well.  She has no prior history of DVT.  She does have a history of lupus and peripheral neuropathy.  She does not really complain of any similar symptoms in the right leg.  We discussed that some of the symptoms may be related to her peripheral neuropathy in addition to venous reflux.  She does have a sensory component primarily to her neuropathy and is followed by neurology for this.  Other medical problems include hypertension which is currently controlled.  Past Medical History:  Diagnosis Date  . Atypical chest pain 05/23/2019  . Essential hypertension 05/23/2019  . Hypertension   . Lupus (HCC)   . OSA (obstructive sleep apnea) 11/19/2019  . Shortness of breath 05/23/2019  . Snoring 05/23/2019  . Thyroid disease   . Varicose veins of left lower extremity 01/26/2020   Past Surgical History:  Procedure Laterality Date  . ABDOMINAL HYSTERECTOMY      Family History  Problem Relation Age of Onset  . Hypertension Mother   . Hypertension Maternal Grandmother   . Lupus Maternal Grandmother   . Heart failure Maternal Grandmother   . Stroke Maternal Grandmother   . CAD Maternal Grandmother   . Adrenal disorder Neg Hx     SOCIAL HISTORY: Social History   Socioeconomic History  . Marital status: Married    Spouse name: Not on file  . Number of children: Not  on file  . Years of education: Not on file  . Highest education level: Not on file  Occupational History  . Not on file  Tobacco Use  . Smoking status: Current Every Day Smoker    Packs/day: 0.25    Types: Cigarettes  . Smokeless tobacco: Never Used  . Tobacco comment: Patient is participating in health coaching for smoking cessation.   Vaping Use  . Vaping Use: Never used  Substance and Sexual Activity  . Alcohol use: No  . Drug use: No  . Sexual activity: Not on file  Other Topics Concern  . Not on file  Social History Narrative   Right handed      Highest level of edu- 12th grade      7 children      Apartment- 2nd floor   Social Determinants of Health   Financial Resource Strain: High Risk  . Difficulty of Paying Living Expenses: Very hard  Food Insecurity: No Food Insecurity  . Worried About Programme researcher, broadcasting/film/video in the Last Year: Never true  . Ran Out of Food in the Last Year: Never true  Transportation Needs: No Transportation Needs  . Lack of Transportation (Medical): No  . Lack of Transportation (Non-Medical): No  Physical Activity: Not on file  Stress: Stress Concern Present  . Feeling of Stress : Rather much  Social Connections: Not on  file  Intimate Partner Violence: Not on file    Allergies  Allergen Reactions  . Labetalol Anxiety    Headaches Headaches    Current Outpatient Medications  Medication Sig Dispense Refill  . amLODipine (NORVASC) 5 MG tablet Take 1 tablet (5 mg total) by mouth daily. 90 tablet 3  . aspirin (EC-81 ASPIRIN) 81 MG EC tablet Take 1 tablet (81 mg total) by mouth daily. Swallow whole. 30 tablet 12  . atorvastatin (LIPITOR) 40 MG tablet Take 1 tablet (40 mg total) by mouth daily. 90 tablet 3  . chlorthalidone (HYGROTON) 25 MG tablet Take 1 tablet (25 mg total) by mouth daily. 90 tablet 3  . fluticasone (FLONASE) 50 MCG/ACT nasal spray Place 2 sprays into both nostrils daily. 16 g 6  . hydroxychloroquine (PLAQUENIL) 200 MG  tablet Take 1 tablet by mouth 2 (two) times a day.    . irbesartan (AVAPRO) 300 MG tablet Take 1 tablet (300 mg total) by mouth daily. 90 tablet 3  . pregabalin (LYRICA) 200 MG capsule Take 1 capsule (200 mg total) by mouth 2 (two) times daily. 60 capsule 5  . RINVOQ 15 MG TB24 Take 1 tablet by mouth daily.    Marland Kitchen spironolactone (ALDACTONE) 100 MG tablet Take 2 tablets (200 mg total) by mouth daily. 180 tablet 3   No current facility-administered medications for this visit.    ROS:   General:  No weight loss, Fever, chills  HEENT: No recent headaches, no nasal bleeding, no visual changes, no sore throat  Neurologic: No dizziness, blackouts, seizures. No recent symptoms of stroke or mini- stroke. No recent episodes of slurred speech, or temporary blindness.  Cardiac: No recent episodes of chest pain/pressure, no shortness of breath at rest.  No shortness of breath with exertion.  Denies history of atrial fibrillation or irregular heartbeat  Vascular: No history of rest pain in feet.  No history of claudication.  No history of non-healing ulcer, No history of DVT   Pulmonary: No home oxygen, no productive cough, no hemoptysis,  No asthma or wheezing  Musculoskeletal:  [ ]  Arthritis, [X]  Low back pain,  [X]  Joint pain  Hematologic:No history of hypercoagulable state.  No history of easy bleeding.  No history of anemia  Gastrointestinal: No hematochezia or melena,  No gastroesophageal reflux, no trouble swallowing  Urinary: [ ]  chronic Kidney disease, [ ]  on HD - [ ]  MWF or [ ]  TTHS, [ ]  Burning with urination, [ ]  Frequent urination, [ ]  Difficulty urinating;   Skin: No rashes  Psychological: No history of anxiety,  No history of depression   Physical Examination   Vitals:   06/26/20 1545  BP: (!) 139/94  Pulse: 98  Resp: 20  Temp: 98.2 F (36.8 C)  SpO2: 98%  Weight: 178 lb (80.7 kg)  Height: 5\' 4"  (1.626 m)    General:  Alert and oriented, no acute distress HEENT:  Normal Neck: No JVD Cardiac: Regular Rate and Rhythm Skin: No rash, no surface varicosities Extremity Pulses:  2+ radial, brachial, femoral, dorsalis pedis pulses bilaterally Musculoskeletal: No deformity or edema  Neurologic: Upper and lower extremity motor 5/5 and symmetric  DATA: I reviewed the patient's venous reflux exam dated very second 2022.  This showed fairly diffuse reflux in the left greater saphenous vein with a vein diameter of 4 to 8 mm.  Lesser saphenous vein and deep venous system did not show significant reflux.  There was no evidence of DVT.  The  patient previously had had ABIs performed in November 2021 which were greater than 1 triphasic and normal bilaterally.  I also performed a SonoSite at the bedside today which shows a fairly uniform diameter 4 mm greater saphenous vein.  There is a bifid system and there is a more anterior superficial branch as well which was of similar diameter.  This branch form is right at the saphenofemoral junction.  ASSESSMENT: Lengthy discussion with the patient today regarding pathophysiology of veins and possible contribution to her symptoms of fullness heaviness aching and occasional swelling.  We also discussed that some of her symptoms are probably related to other causes such as her neuropathy.  We discussed improvement of some of her symptoms were probably not completely resolution of her symptoms because she has 2 underlying etiologies.  I discussed the risk benefits possible complications and procedure details of laser ablation of the left greater saphenous vein.  These include but are not limited to bleeding infection nerve injury.  Small risk of DVT.  She would like to proceed and agrees that some of her symptoms may not be completely alleviated but hopefully she will get some improvement of her symptoms.   PLAN: Laser ablation left greater saphenous vein pending insurance approval.   Fabienne Bruns, MD [facility] [place] Office: [phone]

## 2020-07-02 ENCOUNTER — Telehealth: Payer: Self-pay | Admitting: *Deleted

## 2020-07-02 NOTE — Telephone Encounter (Signed)
[employer] nurse reviewer [facility] left telephone voice message stating that [facility] prior authorization # [id number] (endovenous laser ablation left greater saphenous  vein) has been denied because per [employer]'s policy guideline the diameter of the greater saphenous vein must be > 4.5 mm and [name]'s venous reflux study did not meet this criteria.  Called Ms. Denise Carter with [employer]'s denial decision.  Encouraged her to return in 6 months for repeat venous reflux study if her symptoms continue.

## 2020-07-16 ENCOUNTER — Ambulatory Visit: Payer: 59

## 2020-08-13 ENCOUNTER — Ambulatory Visit: Payer: 59 | Admitting: Endocrinology

## 2020-08-20 NOTE — Telephone Encounter (Signed)
Message was sent to patient with labs test via MyChart

## 2020-09-24 ENCOUNTER — Other Ambulatory Visit: Payer: Self-pay

## 2020-09-24 ENCOUNTER — Ambulatory Visit (HOSPITAL_COMMUNITY)
Admission: EM | Admit: 2020-09-24 | Discharge: 2020-09-24 | Disposition: A | Discharge status: Home / Self Care | Payer: Self-pay | Attending: Physician Assistant | Admitting: Physician Assistant

## 2020-09-24 ENCOUNTER — Encounter (HOSPITAL_COMMUNITY): Payer: Self-pay

## 2020-09-24 DIAGNOSIS — R3915 Urgency of urination: Secondary | ICD-10-CM

## 2020-09-24 DIAGNOSIS — R8290 Unspecified abnormal findings in urine: Secondary | ICD-10-CM

## 2020-09-24 DIAGNOSIS — R109 Unspecified abdominal pain: Secondary | ICD-10-CM | POA: Insufficient documentation

## 2020-09-24 DIAGNOSIS — R350 Frequency of micturition: Secondary | ICD-10-CM

## 2020-09-24 DIAGNOSIS — R10A1 Flank pain, right side: Secondary | ICD-10-CM

## 2020-09-24 DIAGNOSIS — R310 Gross hematuria: Secondary | ICD-10-CM

## 2020-09-24 DIAGNOSIS — I10 Essential (primary) hypertension: Secondary | ICD-10-CM

## 2020-09-24 DIAGNOSIS — N200 Calculus of kidney: Secondary | ICD-10-CM

## 2020-09-24 LAB — CBC WITH DIFFERENTIAL/PLATELET
Abs Immature Granulocytes: 0.06 10*3/uL (ref 0.00–0.07)
Basophils Absolute: 0 10*3/uL (ref 0.0–0.1)
Basophils Relative: 0 %
Eosinophils Absolute: 0.2 10*3/uL (ref 0.0–0.5)
Eosinophils Relative: 2 %
HCT: 39.1 % (ref 36.0–46.0)
Hemoglobin: 12.2 g/dL (ref 12.0–15.0)
Immature Granulocytes: 1 %
Lymphocytes Relative: 24 %
Lymphs Abs: 2.1 10*3/uL (ref 0.7–4.0)
MCH: 27.6 pg (ref 26.0–34.0)
MCHC: 31.2 g/dL (ref 30.0–36.0)
MCV: 88.5 fL (ref 80.0–100.0)
Monocytes Absolute: 0.6 10*3/uL (ref 0.1–1.0)
Monocytes Relative: 7 %
Neutro Abs: 5.9 10*3/uL (ref 1.7–7.7)
Neutrophils Relative %: 66 %
Platelets: 207 10*3/uL (ref 150–400)
RBC: 4.42 MIL/uL (ref 3.87–5.11)
RDW: 14 % (ref 11.5–15.5)
WBC: 8.9 10*3/uL (ref 4.0–10.5)
nRBC: 0 % (ref 0.0–0.2)

## 2020-09-24 LAB — COMPREHENSIVE METABOLIC PANEL
ALT: 6 U/L (ref 0–44)
AST: 16 U/L (ref 15–41)
Albumin: 3.3 g/dL — ABNORMAL LOW (ref 3.5–5.0)
Alkaline Phosphatase: 59 U/L (ref 38–126)
Anion gap: 9 (ref 5–15)
BUN: 9 mg/dL (ref 6–20)
CO2: 29 mmol/L (ref 22–32)
Calcium: 8.8 mg/dL — ABNORMAL LOW (ref 8.9–10.3)
Chloride: 102 mmol/L (ref 98–111)
Creatinine, Ser: 0.88 mg/dL (ref 0.44–1.00)
GFR, Estimated: >60 mL/min (ref >60)
Glucose, Bld: 84 mg/dL (ref 70–99)
Potassium: 4.1 mmol/L (ref 3.5–5.1)
Sodium: 140 mmol/L (ref 135–145)
Total Bilirubin: 0.5 mg/dL (ref 0.3–1.2)
Total Protein: 7.5 g/dL (ref 6.5–8.1)

## 2020-09-24 LAB — POCT URINALYSIS DIPSTICK, ED / UC
Glucose, UA: 250 mg/dL — AB
Ketones, ur: 40 mg/dL — AB
Nitrite: POSITIVE — AB
Protein, ur: >=300 mg/dL — AB
Specific Gravity, Urine: 1.02 (ref 1.005–1.030)
Urobilinogen, UA: 4 mg/dL — ABNORMAL HIGH (ref 0.0–1.0)
pH: 6.5 (ref 5.0–8.0)

## 2020-09-24 MED ORDER — HYDROCODONE-ACETAMINOPHEN 5-325 MG PO TABS: 1.0000 | ORAL_TABLET | Freq: Three times a day (TID) | ORAL | 0 refills | Status: AC | PRN

## 2020-09-24 MED ORDER — CEFTRIAXONE SODIUM 1 G IJ SOLR
1.0000 g | Freq: Once | INTRAMUSCULAR | Status: AC
  Administered 2020-09-24: 1 g via INTRAMUSCULAR

## 2020-09-24 MED ORDER — SULFAMETHOXAZOLE-TRIMETHOPRIM 800-160 MG PO TABS: 1.0000 | ORAL_TABLET | Freq: Two times a day (BID) | ORAL | 0 refills | Status: AC

## 2020-09-24 MED ORDER — TAMSULOSIN HCL 0.4 MG PO CAPS: 0.4000 mg | ORAL_CAPSULE | Freq: Every day | ORAL | 0 refills | Status: DC

## 2020-09-24 MED ORDER — LIDOCAINE HCL (PF) 1 % IJ SOLN
INTRAMUSCULAR | Status: AC
  Filled 2020-09-24: qty 4

## 2020-09-24 MED ORDER — CEFTRIAXONE SODIUM 1 G IJ SOLR
INTRAMUSCULAR | Status: AC
  Filled 2020-09-24: qty 10

## 2020-09-24 NOTE — Discharge Instructions (Addendum)
I believe you are passing kidney stones.  Please follow-up with urology as soon as possible.  We are going to treat you for an infection as well so please take medication as prescribed.  We will contact you if your lab work is abnormal.  Use Flomax daily to help pass the kidney stone.  You can use hydrocodone up to 3 times a day for pain relief but should not drive or drink alcohol while taking it.  Make sure you are drinking plenty of fluid.  If you have persistent pain or if things or not improving with medication you need to go to the emergency room for CT scan as we discussed.  Your blood pressure is very elevated.  It is very important that you go home and take your medication and monitor your blood pressure at home.  If this remains elevated you need to be seen immediately.  If you develop chest pain, shortness of breath, leg swelling, headache, dizziness in the setting of high blood pressure you need to go to the hospital.  Please follow-up with either our clinic or your primary care within a week with your blood pressure log for recheck.

## 2020-09-24 NOTE — ED Provider Notes (Signed)
[facility]    CSN: [id number] Arrival date & time: 09/24/20  1344      History   Chief Complaint Chief Complaint  Patient presents with   UTI    HPI Denise Carter is a 50 y.o. female.   Patient presents today with 3-day history of urinary symptoms.  She reports dysuria, urinary frequency, urinary urgency.  As she was giving [place] a urine specimen today she passed to small kidney stones but denies history of nephrolithiasis.  She does report associated hematuria as well as lower abdominal pain.  She denies any fever, nausea, vomiting, vaginal symptoms, changes in bowel habits.  She has tried Azo over-the-counter without improvement of symptoms.  Denies any recent antibiotic use.  She has no concern for STI.  Pain is rated 7 on a 0-10 pain scale, localized to lower abdomen with radiation towards right flank, described as sharp, no aggravating relieving factors identified.  She denies any recent urogenital procedure, history of self-catheterization, seeing urology in the past.  She has no concern for pregnancy as she is status post partial hysterectomy.  Patient's blood pressure was noted to be very elevated today.  Reports she has not taken her medication in approximately 2 days as she was out of town.  She denies any decongestant use, increased caffeine intake, increase sodium.  She denies any chest pain, shortness of breath, leg swelling, heart racing, dizziness, headache, vision changes.  She does have a way to monitor her blood pressure at home and agrees to go home and take her medication and monitor BP closely.   Past Medical History:  Diagnosis Date   Atypical chest pain 05/23/2019   Essential hypertension 05/23/2019   Hypertension    Lupus (HCC)    OSA (obstructive sleep apnea) 11/19/2019   Shortness of breath 05/23/2019   Snoring 05/23/2019   Thyroid disease    Varicose veins of left lower extremity 01/26/2020    Patient Active Problem List   Diagnosis Date  Noted   Facet arthritis of lumbar region 06/18/2020   OSA (obstructive sleep apnea) 11/19/2019   Thyroid nodule 07/23/2019   Atypical chest pain 05/23/2019   Resistant hypertension 05/23/2019   Snoring 05/23/2019   Rheumatoid arthritis without rheumatoid factor, multiple sites (HCC) 06/22/2018   Polyarthralgia 06/22/2018   Sjogren's disease (HCC) 06/22/2018   Polyclonal gammopathy determined by serum protein electrophoresis 01/30/2017    Past Surgical History:  Procedure Laterality Date   ABDOMINAL HYSTERECTOMY      OB History   No obstetric history on file.      Home Medications    Prior to Admission medications   Medication Sig Start Date End Date Taking? Authorizing Provider  HYDROcodone-acetaminophen (NORCO/VICODIN) 5-325 MG tablet Take 1 tablet by mouth every 8 (eight) hours as needed for up to 2 days. 09/24/20 09/26/20 Yes Ariyan Sinnett K, PA-C  sulfamethoxazole-trimethoprim (BACTRIM DS) 800-160 MG tablet Take 1 tablet by mouth 2 (two) times daily for 7 days. 09/24/20 10/01/20 Yes Desarie Feild, Noberto Retort, PA-C  tamsulosin (FLOMAX) 0.4 MG CAPS capsule Take 1 capsule (0.4 mg total) by mouth daily. 09/24/20  Yes Raylene Carmickle K, PA-C  amLODipine (NORVASC) 5 MG tablet Take 1 tablet (5 mg total) by mouth daily. 03/04/20   Chilton Si, MD  aspirin (EC-81 ASPIRIN) 81 MG EC tablet Take 1 tablet (81 mg total) by mouth daily. Swallow whole. 12/06/19   Arvilla Market, MD  atorvastatin (LIPITOR) 40 MG tablet Take 1 tablet (40 mg  total) by mouth daily. 12/06/19   Arvilla Market, MD  chlorthalidone (HYGROTON) 25 MG tablet Take 1 tablet (25 mg total) by mouth daily. 03/04/20   Chilton Si, MD  fluticasone [facility]) 50 MCG/ACT nasal spray Place 2 sprays into both nostrils daily. 05/28/20   Arvilla Market, MD  hydroxychloroquine (PLAQUENIL) 200 MG tablet Take 1 tablet by mouth 2 (two) times a day. 06/07/18   [provider]  irbesartan (AVAPRO) 300 MG tablet  Take 1 tablet (300 mg total) by mouth daily. 03/04/20   Chilton Si, MD  pregabalin (LYRICA) 200 MG capsule Take 1 capsule (200 mg total) by mouth 2 (two) times daily. 04/25/20   Patel, Donika K, DO  RINVOQ 15 MG TB24 Take 1 tablet by mouth daily. 11/07/19   [provider]  spironolactone (ALDACTONE) 100 MG tablet Take 2 tablets (200 mg total) by mouth daily. 06/05/20   Romero Belling, MD    Family History Family History  Problem Relation Age of Onset   Hypertension Mother    Hypertension Maternal Grandmother    Lupus Maternal Grandmother    Heart failure Maternal Grandmother    Stroke Maternal Grandmother    CAD Maternal Grandmother    Adrenal disorder Neg Hx     Social History Social History   Tobacco Use   Smoking status: Every Day    Packs/day: 0.25    Types: Cigarettes   Smokeless tobacco: Never   Tobacco comments:    Patient is participating in health coaching for smoking cessation.   Vaping Use   Vaping Use: Never used  Substance Use Topics   Alcohol use: No   Drug use: No     Allergies   Labetalol   Review of Systems Review of Systems  Constitutional:  Positive for activity change. Negative for appetite change, fatigue and fever.  Eyes:  Negative for visual disturbance.  Respiratory:  Negative for cough and shortness of breath.   Cardiovascular:  Negative for chest pain.  Gastrointestinal:  Negative for abdominal pain, diarrhea, nausea and vomiting.  Genitourinary:  Positive for dysuria, flank pain, frequency, hematuria and urgency. Negative for pelvic pain, vaginal bleeding, vaginal discharge and vaginal pain.  Musculoskeletal:  Positive for back pain. Negative for arthralgias and myalgias.  Neurological:  Negative for dizziness, light-headedness and headaches.    Physical Exam Triage Vital Signs ED Triage Vitals  Enc Vitals Group     BP 09/24/20 1512 (S) (!) 189/128     Pulse Rate 09/24/20 1512 100     Resp 09/24/20 1512 17     Temp  09/24/20 1512 98.9 F (37.2 C)     Temp Source 09/24/20 1512 Oral     SpO2 09/24/20 1512 93 %     Weight --      Height --      Head Circumference --      Peak Flow --      Pain Score 09/24/20 1515 7     Pain Loc --      Pain Edu? --      Excl. in GC? --    No data found.  Updated Vital Signs BP (!) 167/124 (BP Location: Left Arm)   Pulse 100   Temp 98.9 F (37.2 C) (Oral)   Resp 17   SpO2 93%   Visual Acuity Right Eye Distance:   Left Eye Distance:   Bilateral Distance:    Right Eye Near:   Left Eye Near:  Bilateral Near:     Physical Exam Vitals reviewed.  Constitutional:      General: She is awake. She is not in acute distress.    Appearance: Normal appearance. She is normal weight. She is not ill-appearing.     Comments: Very pleasant female appears stated age in no acute distress  HENT:     Head: Normocephalic and atraumatic.  Cardiovascular:     Rate and Rhythm: Normal rate and regular rhythm.     Heart sounds: Normal heart sounds, S1 normal and S2 normal. No murmur heard. Pulmonary:     Effort: Pulmonary effort is normal.     Breath sounds: Normal breath sounds. No wheezing, rhonchi or rales.     Comments: Clear to auscultation bilaterally Abdominal:     General: Bowel sounds are normal.     Palpations: Abdomen is soft.     Tenderness: There is abdominal tenderness in the right lower quadrant and suprapubic area. There is right CVA tenderness. There is no left CVA tenderness, guarding or rebound.     Comments: Mild tenderness palpation throughout lower abdomen.  No evidence of acute abdomen on physical exam.  CVA tenderness on right.  Musculoskeletal:     Right lower leg: No edema.     Left lower leg: No edema.  Psychiatric:        Behavior: Behavior is cooperative.     UC Treatments / Results  Labs (all labs ordered are listed, but only abnormal results are displayed) Labs Reviewed  POCT URINALYSIS DIPSTICK, ED / UC - Abnormal; Notable for the  following components:      Result Value   Glucose, UA 250 (*)    Bilirubin Urine LARGE (*)    Ketones, ur 40 (*)    Hgb urine dipstick LARGE (*)    Protein, ur >=300 (*)    Urobilinogen, UA 4.0 (*)    Nitrite POSITIVE (*)    Leukocytes,Ua LARGE (*)    All other components within normal limits  URINE CULTURE  CBC WITH DIFFERENTIAL/PLATELET  COMPREHENSIVE METABOLIC PANEL    EKG   Radiology No results found.  Procedures Procedures (including critical care time)  Medications Ordered in UC Medications  cefTRIAXone (ROCEPHIN) injection 1 g (1 g Intramuscular Given 09/24/20 1603)    Initial Impression / Assessment and Plan / UC Course  I have reviewed the triage vital signs and the nursing notes.  Pertinent labs & imaging results that were available during my care of the patient were reviewed by me and considered in my medical decision making (see chart for details).      Patient past kidney stone and urine specimen today and discussed this is the likely cause of her symptoms.  Concern for infected stone given clinical presentation so patient was given 1 g of Rocephin and started on Bactrim.  She was instructed to stop Bactrim and seek immediate medical attention with any oral lesions or rashes.  She was started on Flomax to help pass kidney stones.  She was given several doses of hydrocodone to have on hand for pain relief with instruction not to drive or drink alcohol while taking this medication.  Discussed that we do not have CT capabilities and if she has persistent or worsening symptoms she needs to go to the emergency room.  She was encouraged to drink plenty of fluid.  She was given contact information for urology and instructed to follow-up as soon as possible.  CBC and CMP obtained today  and discussed that if her kidney function is abnormal or she has significant leukocytosis she would need to go to the hospital.  Discussed alarm symptoms that warrant emergent evaluation.   Strict return precautions given to which patient expressed understanding.  Blood pressure was extremely elevated today.  Patient denies any signs or symptoms of endorgan damage.  She does have access to her blood pressure medication and will return home immediately after visit and take dose.  She was instructed to monitor blood pressure closely at home and if this remains elevated above 140/90 she needs to be reevaluated.  Discussed that if she develops any chest pain, shortness of breath, leg swelling, vision changes, headache in the setting of high blood pressure she needs to go to the hospital.  She is to follow-up with either our clinic or her primary care provider within a week for blood pressure recheck.  Final Clinical Impressions(s) / UC Diagnoses   Final diagnoses:  Right flank pain  Gross hematuria  Abnormal urinalysis  Nephrolithiasis  Urinary urgency  Urinary frequency  Elevated blood pressure reading with diagnosis of hypertension     Discharge Instructions      I believe you are passing kidney stones.  Please follow-up with urology as soon as possible.  We are going to treat you for an infection as well so please take medication as prescribed.  We will contact you if your lab work is abnormal.  Use Flomax daily to help pass the kidney stone.  You can use hydrocodone up to 3 times a day for pain relief but should not drive or drink alcohol while taking it.  Make sure you are drinking plenty of fluid.  If you have persistent pain or if things or not improving with medication you need to go to the emergency room for CT scan as we discussed.  Your blood pressure is very elevated.  It is very important that you go home and take your medication and monitor your blood pressure at home.  If this remains elevated you need to be seen immediately.  If you develop chest pain, shortness of breath, leg swelling, headache, dizziness in the setting of high blood pressure you need to go to the  hospital.  Please follow-up with either our clinic or your primary care within a week with your blood pressure log for recheck.     ED Prescriptions     Medication Sig Dispense Auth. Provider   sulfamethoxazole-trimethoprim (BACTRIM DS) 800-160 MG tablet Take 1 tablet by mouth 2 (two) times daily for 7 days. 14 tablet Aalyah Mansouri K, PA-C   HYDROcodone-acetaminophen (NORCO/VICODIN) 5-325 MG tablet Take 1 tablet by mouth every 8 (eight) hours as needed for up to 2 days. 6 tablet Belinda Bringhurst K, PA-C   tamsulosin (FLOMAX) 0.4 MG CAPS capsule Take 1 capsule (0.4 mg total) by mouth daily. 15 capsule Kahron Kauth K, PA-C      I have reviewed the PDMP during this encounter.   Jeani Hawking, PA-C 09/24/20 1607

## 2020-09-24 NOTE — ED Triage Notes (Signed)
Pt presents with urinary urgency and burning X 3 days.

## 2020-09-27 LAB — URINE CULTURE: Culture: >=100000 — AB

## 2020-11-10 ENCOUNTER — Telehealth: Payer: Self-pay

## 2020-11-10 NOTE — Telephone Encounter (Signed)
Patient called saying she has a mammogram referral and was told that a new referral needs to be placed because her breast is leaking and the referral needs to say its for a sonogram and digital.   Please f/u

## 2020-11-11 NOTE — Telephone Encounter (Signed)
Needs an appt. Please schedule appointment when patient calls back. LVM informing an appt is needed.

## 2020-11-12 ENCOUNTER — Ambulatory Visit: Payer: Self-pay | Admitting: Nurse Practitioner

## 2020-11-14 NOTE — Telephone Encounter (Signed)
Scheduled appt to discuss breast concerns with Dr. Andrey Campanile.  Patient is w/o insurance at the time.

## 2020-11-17 ENCOUNTER — Ambulatory Visit: Payer: Self-pay | Admitting: Family Medicine

## 2020-11-30 NOTE — Progress Notes (Deleted)
Patient ID: Denise Carter, female    DOB: April 30, 1970  MRN: [id number]  CC: No chief complaint on file.   Subjective: Denise Carter is a 50 y.o. female who presents for Her concerns today include:    FOLLOWED BY CARDS FOR HTN   Patient Active Problem List   Diagnosis Date Noted   Facet arthritis of lumbar region 06/18/2020   OSA (obstructive sleep apnea) 11/19/2019   Thyroid nodule 07/23/2019   Atypical chest pain 05/23/2019   Resistant hypertension 05/23/2019   Snoring 05/23/2019   Rheumatoid arthritis without rheumatoid factor, multiple sites (HCC) 06/22/2018   Polyarthralgia 06/22/2018   Sjogren's disease (HCC) 06/22/2018   Polyclonal gammopathy determined by serum protein electrophoresis 01/30/2017     Current Outpatient Medications on File Prior to Visit  Medication Sig Dispense Refill   amLODipine (NORVASC) 5 MG tablet Take 1 tablet (5 mg total) by mouth daily. 90 tablet 3   aspirin (EC-81 ASPIRIN) 81 MG EC tablet Take 1 tablet (81 mg total) by mouth daily. Swallow whole. 30 tablet 12   atorvastatin (LIPITOR) 40 MG tablet Take 1 tablet (40 mg total) by mouth daily. 90 tablet 3   chlorthalidone (HYGROTON) 25 MG tablet Take 1 tablet (25 mg total) by mouth daily. 90 tablet 3   fluticasone (FLONASE) 50 MCG/ACT nasal spray Place 2 sprays into both nostrils daily. 16 g 6   hydroxychloroquine (PLAQUENIL) 200 MG tablet Take 1 tablet by mouth 2 (two) times a day.     irbesartan (AVAPRO) 300 MG tablet Take 1 tablet (300 mg total) by mouth daily. 90 tablet 3   pregabalin (LYRICA) 200 MG capsule Take 1 capsule (200 mg total) by mouth 2 (two) times daily. 60 capsule 5   RINVOQ 15 MG TB24 Take 1 tablet by mouth daily.     spironolactone (ALDACTONE) 100 MG tablet Take 2 tablets (200 mg total) by mouth daily. 180 tablet 3   tamsulosin (FLOMAX) 0.4 MG CAPS capsule Take 1 capsule (0.4 mg total) by mouth daily. 15 capsule 0   No current facility-administered medications on file  prior to visit.    Allergies  Allergen Reactions   Labetalol Anxiety    Headaches Headaches    Social History   Socioeconomic History   Marital status: Married    Spouse name: Not on file   Number of children: Not on file   Years of education: Not on file   Highest education level: Not on file  Occupational History   Not on file  Tobacco Use   Smoking status: Every Day    Packs/day: 0.25    Types: Cigarettes   Smokeless tobacco: Never   Tobacco comments:    Patient is participating in health coaching for smoking cessation.   Vaping Use   Vaping Use: Never used  Substance and Sexual Activity   Alcohol use: No   Drug use: No   Sexual activity: Not on file  Other Topics Concern   Not on file  Social History Narrative   Right handed      Highest level of edu- 12th grade      7 children      Apartment- 2nd floor   Social Determinants of Health   Financial Resource Strain: High Risk   Difficulty of Paying Living Expenses: Very hard  Food Insecurity: No Food Insecurity   Worried About Running Out of Food in the Last Year: Never true   Ran Out of Food in the  Last Year: Never true  Transportation Needs: No Transportation Needs   Lack of Transportation (Medical): No   Lack of Transportation (Non-Medical): No  Physical Activity: Not on file  Stress: Stress Concern Present   Feeling of Stress : Rather much  Social Connections: Not on file  Intimate Partner Violence: Not on file    Family History  Problem Relation Age of Onset   Hypertension Mother    Hypertension Maternal Grandmother    Lupus Maternal Grandmother    Heart failure Maternal Grandmother    Stroke Maternal Grandmother    CAD Maternal Grandmother    Adrenal disorder Neg Hx     Past Surgical History:  Procedure Laterality Date   ABDOMINAL HYSTERECTOMY      ROS: Review of Systems Negative except as stated above  PHYSICAL EXAM: There were no vitals taken for this visit.  Physical  Exam  {female adult master:310786} {female adult master:310785}  CMP Latest Ref Rng & Units 09/24/2020 06/05/2020 01/23/2020  Glucose 70 - 99 mg/dL 84 85 83  BUN 6 - 20 mg/dL 9 11 7   Creatinine 0.44 - 1.00 mg/dL 7.07 8.67  Sodium 135 - 145 mmol/L 140 138 140  Potassium 3.5 - 5.1 mmol/L 4.1 3.9 4.1  Chloride 98 - 111 mmol/L 102 104 103  CO2 22 - 32 mmol/L 29 28 25   Calcium 8.9 - 10.3 mg/dL 5.44) 9.1 8.9  Total Protein 6.5 - 8.1 g/dL 7.5 - -  Total Bilirubin 0.3 - 1.2 mg/dL 0.5 - -  Alkaline Phos 38 - 126 U/L 59 - -  AST 15 - 41 U/L 16 - -  ALT 0 - 44 U/L 6 - -   Lipid Panel     Component Value Date/Time   CHOL 196 12/05/2019 1119   TRIG 65 12/05/2019 1119   HDL 68 12/05/2019 1119   CHOLHDL 2.9 12/05/2019 1119   LDLCALC 116 (H) 12/05/2019 1119    CBC    Component Value Date/Time   WBC 8.9 09/24/2020 1538   RBC 4.42 09/24/2020 1538   HGB 12.2 09/24/2020 1538   HGB 13.0 12/05/2019 1119   HCT 39.1 09/24/2020 1538   HCT 40.8 12/05/2019 1119   PLT 207 09/24/2020 1538   PLT 167 12/05/2019 1119   MCV 88.5 09/24/2020 1538   MCV 86 12/05/2019 1119   MCH 27.6 09/24/2020 1538   MCHC 31.2 09/24/2020 1538   RDW 14.0 09/24/2020 1538   RDW 13.4 12/05/2019 1119   LYMPHSABS 2.1 09/24/2020 1538   LYMPHSABS 2.1 12/05/2019 1119   MONOABS 0.6 09/24/2020 1538   EOSABS 0.2 09/24/2020 1538   EOSABS 0.0 12/05/2019 1119   BASOSABS 0.0 09/24/2020 1538   BASOSABS 0.0 12/05/2019 1119    ASSESSMENT AND PLAN:  There are no diagnoses linked to this encounter.   Patient was given the opportunity to ask questions.  Patient verbalized understanding of the plan and was able to repeat key elements of the plan. Patient was given clear instructions to go to Emergency Department or return to medical center if symptoms don't improve, worsen, or new problems develop.The patient verbalized understanding.   No orders of the defined types were placed in this encounter.    Requested  Prescriptions    No prescriptions requested or ordered in this encounter    No follow-ups on file.  09/26/2020, NP

## 2020-12-03 ENCOUNTER — Ambulatory Visit: Payer: 59 | Admitting: Family

## 2021-01-28 NOTE — Progress Notes (Deleted)
Patient ID: Denise Carter, female    DOB: 1970/10/02  MRN: [id number]  CC: No chief complaint on file.  BP managed by Cards   Subjective: Denise Carter is a 51 y.o. female who presents for Her concerns today include: ***  Patient Active Problem List   Diagnosis Date Noted   Facet arthritis of lumbar region 06/18/2020   OSA (obstructive sleep apnea) 11/19/2019   Thyroid nodule 07/23/2019   Atypical chest pain 05/23/2019   Resistant hypertension 05/23/2019   Snoring 05/23/2019   Rheumatoid arthritis without rheumatoid factor, multiple sites (HCC) 06/22/2018   Polyarthralgia 06/22/2018   Sjogren's disease (HCC) 06/22/2018   Polyclonal gammopathy determined by serum protein electrophoresis 01/30/2017     Current Outpatient Medications on File Prior to Visit  Medication Sig Dispense Refill   amLODipine (NORVASC) 5 MG tablet Take 1 tablet (5 mg total) by mouth daily. 90 tablet 3   aspirin (EC-81 ASPIRIN) 81 MG EC tablet Take 1 tablet (81 mg total) by mouth daily. Swallow whole. 30 tablet 12   atorvastatin (LIPITOR) 40 MG tablet Take 1 tablet (40 mg total) by mouth daily. 90 tablet 3   chlorthalidone (HYGROTON) 25 MG tablet Take 1 tablet (25 mg total) by mouth daily. 90 tablet 3   fluticasone (FLONASE) 50 MCG/ACT nasal spray Place 2 sprays into both nostrils daily. 16 g 6   hydroxychloroquine (PLAQUENIL) 200 MG tablet Take 1 tablet by mouth 2 (two) times a day.     irbesartan (AVAPRO) 300 MG tablet Take 1 tablet (300 mg total) by mouth daily. 90 tablet 3   pregabalin (LYRICA) 200 MG capsule Take 1 capsule (200 mg total) by mouth 2 (two) times daily. 60 capsule 5   RINVOQ 15 MG TB24 Take 1 tablet by mouth daily.     spironolactone (ALDACTONE) 100 MG tablet Take 2 tablets (200 mg total) by mouth daily. 180 tablet 3   tamsulosin (FLOMAX) 0.4 MG CAPS capsule Take 1 capsule (0.4 mg total) by mouth daily. 15 capsule 0   No current facility-administered medications on file prior  to visit.    Allergies  Allergen Reactions   Labetalol Anxiety    Headaches Headaches    Social History   Socioeconomic History   Marital status: Married    Spouse name: Not on file   Number of children: Not on file   Years of education: Not on file   Highest education level: Not on file  Occupational History   Not on file  Tobacco Use   Smoking status: Every Day    Packs/day: 0.25    Types: Cigarettes   Smokeless tobacco: Never   Tobacco comments:    Patient is participating in health coaching for smoking cessation.   Vaping Use   Vaping Use: Never used  Substance and Sexual Activity   Alcohol use: No   Drug use: No   Sexual activity: Not on file  Other Topics Concern   Not on file  Social History Narrative   Right handed      Highest level of edu- 12th grade      7 children      Apartment- 2nd floor   Social Determinants of Health   Financial Resource Strain: Not on file  Food Insecurity: Not on file  Transportation Needs: Not on file  Physical Activity: Not on file  Stress: Stress Concern Present   Feeling of Stress : Rather much  Social Connections: Not on file  Intimate  Partner Violence: Not on file    Family History  Problem Relation Age of Onset   Hypertension Mother    Hypertension Maternal Grandmother    Lupus Maternal Grandmother    Heart failure Maternal Grandmother    Stroke Maternal Grandmother    CAD Maternal Grandmother    Adrenal disorder Neg Hx     Past Surgical History:  Procedure Laterality Date   ABDOMINAL HYSTERECTOMY      ROS: Review of Systems Negative except as stated above  PHYSICAL EXAM: There were no vitals taken for this visit.  Physical Exam  {female adult master:310786} {female adult master:310785}  CMP Latest Ref Rng & Units 09/24/2020 06/05/2020 01/23/2020  Glucose 70 - 99 mg/dL 84 85 83  BUN 6 - 20 mg/dL 9 11 7   Creatinine 0.44 - 1.00 mg/dL 4.43 1.54  Sodium 135 - 145 mmol/L 140 138 140   Potassium 3.5 - 5.1 mmol/L 4.1 3.9 4.1  Chloride 98 - 111 mmol/L 102 104 103  CO2 22 - 32 mmol/L 29 28 25   Calcium 8.9 - 10.3 mg/dL 0.08) 9.1 8.9  Total Protein 6.5 - 8.1 g/dL 7.5 - -  Total Bilirubin 0.3 - 1.2 mg/dL 0.5 - -  Alkaline Phos 38 - 126 U/L 59 - -  AST 15 - 41 U/L 16 - -  ALT 0 - 44 U/L 6 - -   Lipid Panel     Component Value Date/Time   CHOL 196 12/05/2019 1119   TRIG 65 12/05/2019 1119   HDL 68 12/05/2019 1119   CHOLHDL 2.9 12/05/2019 1119   LDLCALC 116 (H) 12/05/2019 1119    CBC    Component Value Date/Time   WBC 8.9 09/24/2020 1538   RBC 4.42 09/24/2020 1538   HGB 12.2 09/24/2020 1538   HGB 13.0 12/05/2019 1119   HCT 39.1 09/24/2020 1538   HCT 40.8 12/05/2019 1119   PLT 207 09/24/2020 1538   PLT 167 12/05/2019 1119   MCV 88.5 09/24/2020 1538   MCV 86 12/05/2019 1119   MCH 27.6 09/24/2020 1538   MCHC 31.2 09/24/2020 1538   RDW 14.0 09/24/2020 1538   RDW 13.4 12/05/2019 1119   LYMPHSABS 2.1 09/24/2020 1538   LYMPHSABS 2.1 12/05/2019 1119   MONOABS 0.6 09/24/2020 1538   EOSABS 0.2 09/24/2020 1538   EOSABS 0.0 12/05/2019 1119   BASOSABS 0.0 09/24/2020 1538   BASOSABS 0.0 12/05/2019 1119    ASSESSMENT AND PLAN:  There are no diagnoses linked to this encounter.   Patient was given the opportunity to ask questions.  Patient verbalized understanding of the plan and was able to repeat key elements of the plan. Patient was given clear instructions to go to Emergency Department or return to medical center if symptoms don't improve, worsen, or new problems develop.The patient verbalized understanding.   No orders of the defined types were placed in this encounter.    Requested Prescriptions    No prescriptions requested or ordered in this encounter    No follow-ups on file.  09/26/2020, NP

## 2021-02-06 ENCOUNTER — Ambulatory Visit: Payer: Self-pay | Admitting: Family

## 2021-02-06 ENCOUNTER — Other Ambulatory Visit: Payer: Self-pay

## 2021-02-06 ENCOUNTER — Ambulatory Visit (INDEPENDENT_AMBULATORY_CARE_PROVIDER_SITE_OTHER): Payer: 59 | Admitting: Family Medicine

## 2021-02-06 ENCOUNTER — Encounter: Payer: Self-pay | Admitting: Family Medicine

## 2021-02-06 VITALS — BP 168/133 | HR 88 | Temp 98.1°F | Resp 16 | Ht 64.0 in | Wt 173.0 lb

## 2021-02-06 DIAGNOSIS — G2581 Restless legs syndrome: Secondary | ICD-10-CM | POA: Insufficient documentation

## 2021-02-06 DIAGNOSIS — M069 Rheumatoid arthritis, unspecified: Secondary | ICD-10-CM | POA: Insufficient documentation

## 2021-02-06 DIAGNOSIS — G9332 Myalgic encephalomyelitis/chronic fatigue syndrome: Secondary | ICD-10-CM | POA: Insufficient documentation

## 2021-02-06 DIAGNOSIS — E663 Overweight: Secondary | ICD-10-CM | POA: Insufficient documentation

## 2021-02-06 DIAGNOSIS — Z1231 Encounter for screening mammogram for malignant neoplasm of breast: Secondary | ICD-10-CM

## 2021-02-06 DIAGNOSIS — I10 Essential (primary) hypertension: Secondary | ICD-10-CM

## 2021-02-06 DIAGNOSIS — M3500 Sicca syndrome, unspecified: Secondary | ICD-10-CM | POA: Insufficient documentation

## 2021-02-06 DIAGNOSIS — M329 Systemic lupus erythematosus, unspecified: Secondary | ICD-10-CM | POA: Insufficient documentation

## 2021-02-06 DIAGNOSIS — E559 Vitamin D deficiency, unspecified: Secondary | ICD-10-CM | POA: Insufficient documentation

## 2021-02-06 DIAGNOSIS — Z23 Encounter for immunization: Secondary | ICD-10-CM

## 2021-02-06 DIAGNOSIS — L4059 Other psoriatic arthropathy: Secondary | ICD-10-CM | POA: Insufficient documentation

## 2021-02-06 MED ORDER — PREGABALIN 200 MG PO CAPS: 200.0000 mg | ORAL_CAPSULE | Freq: Two times a day (BID) | ORAL | 5 refills | Status: DC

## 2021-02-06 MED ORDER — CHLORTHALIDONE 25 MG PO TABS: 25.0000 mg | ORAL_TABLET | Freq: Every day | ORAL | 3 refills | Status: DC

## 2021-02-06 MED ORDER — IRBESARTAN 300 MG PO TABS: 300.0000 mg | ORAL_TABLET | Freq: Every day | ORAL | 3 refills | Status: DC

## 2021-02-06 MED ORDER — ATORVASTATIN CALCIUM 40 MG PO TABS: 40.0000 mg | ORAL_TABLET | Freq: Every day | ORAL | 3 refills | Status: DC

## 2021-02-06 MED ORDER — AMLODIPINE BESYLATE 5 MG PO TABS: 5.0000 mg | ORAL_TABLET | Freq: Every day | ORAL | 3 refills | Status: DC

## 2021-02-06 MED ORDER — SPIRONOLACTONE 100 MG PO TABS: 200.0000 mg | ORAL_TABLET | Freq: Every day | ORAL | 3 refills | Status: DC

## 2021-02-07 ENCOUNTER — Encounter: Payer: Self-pay | Admitting: Family Medicine

## 2021-02-07 NOTE — Progress Notes (Signed)
New Patient Office Visit  Subjective:  Patient ID: Denise Carter, female    DOB: 1970/08/22  Age: 51 y.o. MRN: [id number]  CC:  Chief Complaint  Patient presents with   Follow-up    6 month    HPI Denise Carter presents for follow up of hypertension. Patient reports that she has not had her meds 2/2 insurance issues which are now resolved. She denies acute complaints.   Past Medical History:  Diagnosis Date   Atypical chest pain 05/23/2019   Essential hypertension 05/23/2019   Hypertension    Lupus (HCC)    OSA (obstructive sleep apnea) 11/19/2019   Shortness of breath 05/23/2019   Snoring 05/23/2019   Thyroid disease    Varicose veins of left lower extremity 01/26/2020    Past Surgical History:  Procedure Laterality Date   ABDOMINAL HYSTERECTOMY      Family History  Problem Relation Age of Onset   Hypertension Mother    Hypertension Maternal Grandmother    Lupus Maternal Grandmother    Heart failure Maternal Grandmother    Stroke Maternal Grandmother    CAD Maternal Grandmother    Adrenal disorder Neg Hx     Social History   Socioeconomic History   Marital status: Married    Spouse name: Not on file   Number of children: Not on file   Years of education: Not on file   Highest education level: Not on file  Occupational History   Not on file  Tobacco Use   Smoking status: Every Day    Packs/day: 0.25    Types: Cigarettes   Smokeless tobacco: Never   Tobacco comments:    Patient is participating in health coaching for smoking cessation.   Vaping Use   Vaping Use: Never used  Substance and Sexual Activity   Alcohol use: No   Drug use: No   Sexual activity: Not on file  Other Topics Concern   Not on file  Social History Narrative   Right handed      Highest level of edu- 12th grade      7 children      Apartment- 2nd floor   Social Determinants of Health   Financial Resource Strain: Not on file  Food Insecurity: Not on file   Transportation Needs: Not on file  Physical Activity: Not on file  Stress: Stress Concern Present   Feeling of Stress : Rather much  Social Connections: Not on file  Intimate Partner Violence: Not on file    ROS Review of Systems  All other systems reviewed and are negative.  Objective:   Today's Vitals: BP (!) 168/133    Pulse 88    Temp 98.1 F (36.7 C) (Oral)    Resp 16    Ht _0  (1.626 m)    Wt 173 lb (78.5 kg)    SpO2 96%    BMI 29.70 kg/m   Physical Exam Vitals and nursing note reviewed.  Constitutional:      General: She is not in acute distress. Cardiovascular:     Rate and Rhythm: Normal rate and regular rhythm.  Pulmonary:     Effort: Pulmonary effort is normal.     Breath sounds: Normal breath sounds.  Abdominal:     Palpations: Abdomen is soft.     Tenderness: There is no abdominal tenderness.  Musculoskeletal:     Right lower leg: No edema.     Left lower leg: No edema.  Neurological:  General: No focal deficit present.     Mental Status: She is alert and oriented to person, place, and time.    Assessment & Plan:    1. Uncontrolled hypertension Elevated reading. Meds refilled. Monitoring labs ordered. Compliance discussed. Monitor.  - amLODipine (NORVASC) 5 MG tablet; Take 1 tablet (5 mg total) by mouth daily.  Dispense: 90 tablet; Refill: 3 - CMP14+EGFR - Lipid Panel  2. Encounter for screening mammogram for malignant neoplasm of breast Referral for mammogram - MM Digital Screening; Future    Outpatient Encounter Medications as of 02/06/2021  Medication Sig   [DISCONTINUED] pregabalin (LYRICA) 200 MG capsule Take 1 capsule (200 mg total) by mouth 2 (two) times daily.   amLODipine (NORVASC) 5 MG tablet Take 1 tablet (5 mg total) by mouth daily.   aspirin (EC-81 ASPIRIN) 81 MG EC tablet Take 1 tablet (81 mg total) by mouth daily. Swallow whole. (Patient not taking: Reported on 02/06/2021)   atorvastatin (LIPITOR) 40 MG tablet Take 1 tablet  (40 mg total) by mouth daily.   chlorthalidone (HYGROTON) 25 MG tablet Take 1 tablet (25 mg total) by mouth daily.   fluticasone (FLONASE) 50 MCG/ACT nasal spray Place 2 sprays into both nostrils daily. (Patient not taking: Reported on 02/06/2021)   hydroxychloroquine (PLAQUENIL) 200 MG tablet Take 1 tablet by mouth 2 (two) times a day. (Patient not taking: Reported on 02/06/2021)   irbesartan (AVAPRO) 300 MG tablet Take 1 tablet (300 mg total) by mouth daily.   pregabalin (LYRICA) 100 MG capsule Take by mouth.   pregabalin (LYRICA) 150 MG capsule 1 capsule   pregabalin (LYRICA) 200 MG capsule Take 1 capsule (200 mg total) by mouth 2 (two) times daily.   RINVOQ 15 MG TB24 Take 1 tablet by mouth daily. (Patient not taking: Reported on 02/06/2021)   spironolactone (ALDACTONE) 100 MG tablet Take 2 tablets (200 mg total) by mouth daily.   tamsulosin (FLOMAX) 0.4 MG CAPS capsule Take 1 capsule (0.4 mg total) by mouth daily. (Patient not taking: Reported on 02/06/2021)   [DISCONTINUED] amLODipine (NORVASC) 5 MG tablet Take 1 tablet (5 mg total) by mouth daily. (Patient not taking: Reported on 02/06/2021)   [DISCONTINUED] atorvastatin (LIPITOR) 40 MG tablet Take 1 tablet (40 mg total) by mouth daily. (Patient not taking: Reported on 02/06/2021)   [DISCONTINUED] chlorthalidone (HYGROTON) 25 MG tablet Take 1 tablet (25 mg total) by mouth daily. (Patient not taking: Reported on 02/06/2021)   [DISCONTINUED] irbesartan (AVAPRO) 300 MG tablet Take 1 tablet (300 mg total) by mouth daily. (Patient not taking: Reported on 02/06/2021)   [DISCONTINUED] spironolactone (ALDACTONE) 100 MG tablet Take 2 tablets (200 mg total) by mouth daily. (Patient not taking: Reported on 02/06/2021)   No facility-administered encounter medications on file as of 02/06/2021.    Follow-up: No follow-ups on file.   Becky Sax, MD

## 2021-02-10 LAB — CMP14+EGFR
ALT: 7 IU/L (ref 0–32)
AST: 14 IU/L (ref 0–40)
Albumin/Globulin Ratio: 1.1 — ABNORMAL LOW (ref 1.2–2.2)
Albumin: 4.1 g/dL (ref 3.8–4.9)
Alkaline Phosphatase: 75 IU/L (ref 44–121)
BUN/Creatinine Ratio: 7 — ABNORMAL LOW (ref 9–23)
BUN: 5 mg/dL — ABNORMAL LOW (ref 6–24)
Bilirubin Total: 0.3 mg/dL (ref 0.0–1.2)
CO2: 28 mmol/L (ref 20–29)
Calcium: 9.1 mg/dL (ref 8.7–10.2)
Chloride: 105 mmol/L (ref 96–106)
Creatinine, Ser: 0.75 mg/dL (ref 0.57–1.00)
Globulin, Total: 3.7 g/dL (ref 1.5–4.5)
Glucose: 80 mg/dL (ref 70–99)
Potassium: 4.4 mmol/L (ref 3.5–5.2)
Sodium: 145 mmol/L — ABNORMAL HIGH (ref 134–144)
Total Protein: 7.8 g/dL (ref 6.0–8.5)
eGFR: 96 mL/min/{1.73_m2} (ref >59)

## 2021-02-10 LAB — LIPID PANEL
Chol/HDL Ratio: 3.1 ratio (ref 0.0–4.4)
Cholesterol, Total: 164 mg/dL (ref 100–199)
HDL: 53 mg/dL
LDL Chol Calc (NIH): 95 mg/dL (ref 0–99)
Triglycerides: 88 mg/dL (ref 0–149)
VLDL Cholesterol Cal: 16 mg/dL (ref 5–40)

## 2021-02-10 LAB — SPECIMEN STATUS REPORT

## 2021-02-13 ENCOUNTER — Other Ambulatory Visit: Payer: Self-pay | Admitting: *Deleted

## 2021-02-13 DIAGNOSIS — J3089 Other allergic rhinitis: Secondary | ICD-10-CM

## 2021-02-13 MED ORDER — FLUTICASONE PROPIONATE 50 MCG/ACT NA SUSP: 2.0000 | Freq: Every day | NASAL | 1 refills | Status: AC

## 2021-02-18 ENCOUNTER — Encounter: Payer: Self-pay | Admitting: Family Medicine

## 2021-02-23 ENCOUNTER — Other Ambulatory Visit: Payer: Self-pay

## 2021-02-23 ENCOUNTER — Encounter: Payer: 59 | Admitting: Family Medicine

## 2021-02-23 NOTE — Progress Notes (Signed)
Patient did not answer phone when call was placed and did not return call during the business day.

## 2021-03-03 ENCOUNTER — Ambulatory Visit
Admission: RE | Admit: 2021-03-03 | Discharge: 2021-03-03 | Disposition: A | Discharge status: Home / Self Care | Payer: 59 | Source: Ambulatory Visit | Attending: Family Medicine | Admitting: Family Medicine

## 2021-03-03 ENCOUNTER — Other Ambulatory Visit: Payer: Self-pay

## 2021-03-03 DIAGNOSIS — Z1231 Encounter for screening mammogram for malignant neoplasm of breast: Secondary | ICD-10-CM

## 2021-03-04 ENCOUNTER — Encounter (HOSPITAL_BASED_OUTPATIENT_CLINIC_OR_DEPARTMENT_OTHER): Payer: Self-pay | Admitting: Family

## 2021-03-04 ENCOUNTER — Ambulatory Visit (INDEPENDENT_AMBULATORY_CARE_PROVIDER_SITE_OTHER): Payer: 59 | Admitting: Family

## 2021-03-04 VITALS — BP 162/110 | HR 98 | Ht 64.0 in | Wt 173.5 lb

## 2021-03-04 DIAGNOSIS — I10 Essential (primary) hypertension: Secondary | ICD-10-CM

## 2021-03-04 DIAGNOSIS — R0602 Shortness of breath: Secondary | ICD-10-CM

## 2021-03-04 DIAGNOSIS — Z720 Tobacco use: Secondary | ICD-10-CM

## 2021-03-04 DIAGNOSIS — G4733 Obstructive sleep apnea (adult) (pediatric): Secondary | ICD-10-CM

## 2021-03-04 NOTE — Patient Instructions (Signed)
Medication Instructions:  Your pharmacy does not open until 10 am. We will call them to inquire about Spironolactone and update you. Based on your pharmacy information and lab work we will adjust your medications and contact you tomorrow.   *If you need a refill on your cardiac medications before your next appointment, please call your pharmacy*   Lab Work: Your physician recommends that you return for lab work today: BMP   If you have labs (blood work) drawn today and your tests are completely normal, you will receive your results only by: MyChart Message (if you have MyChart) OR A paper copy in the mail If you have any lab test that is abnormal or we need to change your treatment, we will call you to review the results.   Testing/Procedures: Your physician has requested that you have an echocardiogram. Echocardiography is a painless test that uses sound waves to create images of your heart. It provides your doctor with information about the size and shape of your heart and how well your hearts chambers and valves are working. This procedure takes approximately one hour. There are no restrictions for this procedure.   Follow-Up: At [facility], you and your health needs are our priority.  As part of our continuing mission to provide you with exceptional heart care, we have created designated Provider Care Teams.  These Care Teams include your primary Cardiologist (physician) and Advanced Practice Providers (APPs -  Physician Assistants and Nurse Practitioners) who all work together to provide you with the care you need, when you need it.  We recommend signing up for the patient portal called "MyChart".  Sign up information is provided on this After Visit Summary.  MyChart is used to connect with patients for Virtual Visits (Telemedicine).  Patients are able to view lab/test results, encounter notes, upcoming appointments, etc.  Non-urgent messages can be sent to your provider as well.   To  learn more about what you can do with MyChart, go to [email or web address].    Your next appointment:   6 week(s)  The format for your next appointment:   In Person  Provider:   Chilton Si, MD or Advanced Practice Provider    Other Instructions  Tips to Measure your Blood Pressure Correctly  Here's what you can do to ensure a correct reading:  Don't drink a caffeinated beverage or smoke during the 30 minutes before the test.  Sit quietly for five minutes before the test begins.  During the measurement, sit in a chair with your feet on the floor and your arm supported so your elbow is at about heart level.  The inflatable part of the cuff should completely cover at least 80% of your upper arm, and the cuff should be placed on bare skin, not over a shirt.  Don't talk during the measurement.  Blood pressure categories  Blood pressure category SYSTOLIC (upper number)  DIASTOLIC (lower number)  Normal Less than 120 mm Hg and Less than 80 mm Hg  Elevated 120-129 mm Hg and Less than 80 mm Hg  High blood pressure: Stage 1 hypertension 130-139 mm Hg or 80-89 mm Hg  High blood pressure: Stage 2 hypertension 140 mm Hg or higher or 90 mm Hg or higher  Hypertensive crisis (consult your doctor immediately) Higher than 180 mm Hg and/or Higher than 120 mm Hg  Source: American Heart Association and American Stroke Association. For more on getting your blood pressure under control, buy Controlling Your Blood Pressure, a Special  Health Report from [facility].    Heart Healthy Diet Recommendations: A low-salt diet is recommended. Meats should be grilled, baked, or boiled. Avoid fried foods. Focus on lean protein sources like fish or chicken with vegetables and fruits. The American Heart Association is a Chief Technology Officer!  American Heart Association Diet and Lifeystyle Recommendations    Exercise recommendations: The American Heart Association recommends 150 minutes of moderate  intensity exercise weekly. Try 30 minutes of moderate intensity exercise 4-5 times per week. This could include walking, jogging, or swimming.  Managing the Challenge of Quitting Smoking Quitting smoking is a physical and mental challenge. You will face cravings, withdrawal symptoms, and temptation. Before quitting, work with your health care provider to make a plan that can help you manage quitting. Preparation can help you quit and keep you from giving in. How to manage lifestyle changes Managing stress Stress can make you want to smoke, and wanting to smoke may cause stress. It is important to find ways to manage your stress. You might try some of the following: Practice relaxation techniques. Breathe slowly and deeply, in through your nose and out through your mouth. Listen to music. Soak in a bath or take a shower. Imagine a peaceful place or vacation. Get some support. Talk with family or friends about your stress. Join a support group. Talk with a counselor or therapist. Get some physical activity. Go for a walk, run, or bike ride. Play a favorite sport. Practice yoga.  Medicines Talk with your health care provider about medicines that might help you deal with cravings and make quitting easier for you. Relationships Social situations can be difficult when you are quitting smoking. To manage this, you can: Avoid parties and other social situations where people might be smoking. Avoid alcohol. Leave right away if you have the urge to smoke. Explain to your family and friends that you are quitting smoking. Ask for support and let them know you might be a bit grumpy. Plan activities where smoking is not an option. General instructions Be aware that many people gain weight after they quit smoking. However, not everyone does. To keep from gaining weight, have a plan in place before you quit and stick to the plan after you quit. Your plan should include: Having healthy snacks. When  you have a craving, it may help to: Eat popcorn, carrots, celery, or other cut vegetables. Chew sugar-free gum. Changing how you eat. Eat small portion sizes at meals. Eat 4-6 small meals throughout the day instead of 1-2 large meals a day. Be mindful when you eat. Do not watch television or do other things that might distract you as you eat. Exercising regularly. Make time to exercise each day. If you do not have time for a long workout, do short bouts of exercise for 5-10 minutes several times a day. Do some form of strengthening exercise, such as weight lifting. Do some exercise that gets your heart beating and causes you to breathe deeply, such as walking fast, running, swimming, or biking. This is very important. Drinking plenty of water or other low-calorie or no-calorie drinks. Drink 6-8 glasses of water daily.  How to recognize withdrawal symptoms Your body and mind may experience discomfort as you try to get used to not having nicotine in your system. These effects are called withdrawal symptoms. They may include: Feeling hungrier than normal. Having trouble concentrating. Feeling irritable or restless. Having trouble sleeping. Feeling depressed. Craving a cigarette. To manage withdrawal symptoms: Avoid  places, people, and activities that trigger your cravings. Remember why you want to quit. Get plenty of sleep. Avoid coffee and other caffeinated drinks. These may worsen some of your symptoms. These symptoms may surprise you. But be assured that they are normal to have when quitting smoking. How to manage cravings Come up with a plan for how to deal with your cravings. The plan should include the following: A definition of the specific situation you want to deal with. An alternative action you will take. A clear idea for how this action will help. The name of someone who might help you with this. Cravings usually last for 5-10 minutes. Consider taking the following actions  to help you with your plan to deal with cravings: Keep your mouth busy. Chew sugar-free gum. Suck on hard candies or a straw. Brush your teeth. Keep your hands and body busy. Change to a different activity right away. Squeeze or play with a ball. Do an activity or a hobby, such as making bead jewelry, practicing needlepoint, or working with wood. Mix up your normal routine. Take a short exercise break. Go for a quick walk or run up and down stairs. Focus on doing something kind or helpful for someone else. Call a friend or family member to talk during a craving. Join a support group. Contact a quitline. Where to find support To get help or find a support group: Call the National Cancer Institute's Smoking Quitline: 1-800-QUIT NOW 917 457 0094(727-220-4601) Visit the website of the Substance Abuse and Mental Health Services Administration: SkateOasis.com.ptwww.samhsa.gov Text QUIT to SmokefreeTXT: 147829: 478848 Where to find more information Visit these websites to find more information on quitting smoking: National Cancer Institute: www.smokefree.gov American Lung Association: www.lung.org American Cancer Society: www.cancer.org Centers for Disease Control and Prevention: FootballExhibition.com.brwww.cdc.gov American Heart Association: www.heart.org Contact a health care provider if: You want to change your plan for quitting. The medicines you are taking are not helping. Your eating feels out of control or you cannot sleep. Get help right away if: You feel depressed or become very anxious. Summary Quitting smoking is a physical and mental challenge. You will face cravings, withdrawal symptoms, and temptation to smoke again. Preparation can help you as you go through these challenges. Try different techniques to manage stress, handle social situations, and prevent weight gain. You can deal with cravings by keeping your mouth busy (such as by chewing gum), keeping your hands and body busy, calling family or friends, or contacting a quitline for  people who want to quit smoking. You can deal with withdrawal symptoms by avoiding places where people smoke, getting plenty of rest, and avoiding drinks with caffeine. This information is not intended to replace advice given to you by your health care provider. Make sure you discuss any questions you have with your health care provider. Document Revised: 09/19/2020 Document Reviewed: 10/31/2018 Elsevier Patient Education  2022 [employer]

## 2021-03-04 NOTE — Progress Notes (Signed)
Office Visit    Patient Name: [name] Date of Encounter: 03/04/2021  PCP:  Georganna Skeans, MD   [facility]  Cardiologist:  Chilton Si, MD  Advanced Practice Provider:  No care team member to display Electrophysiologist:  None      Chief Complaint    Denise Carter is a 51 y.o. female with a hx of RA, Sjorgren's, SLE, resistant hypertension, OSA, tobacco use, varicose veins, hyperlipidemia presents today for follow up of hypertension.  Past Medical History    Past Medical History:  Diagnosis Date   Atypical chest pain 05/23/2019   Essential hypertension 05/23/2019   Hypertension    Lupus (HCC)    OSA (obstructive sleep apnea) 11/19/2019   Shortness of breath 05/23/2019   Snoring 05/23/2019   Thyroid disease    Varicose veins of left lower extremity 01/26/2020   Past Surgical History:  Procedure Laterality Date   ABDOMINAL HYSTERECTOMY      Allergies  Allergies  Allergen Reactions   Labetalol Anxiety    Headaches Headaches    History of Present Illness    Denise Carter is a 51 y.o. female with a hx of RA, Sjorgren's, SLE, resistant hypertension, OSA, tobacco use, varicose veins, hyperlipidemia  last seen 03/04/20 by pharmacy team.  Hypertension initially diagnosed in 2015 at time of SLE diagnosis. BP never well controlled. Previously seen by Dr. Eden Emms but has transitioned to Dr. Duke Salvia. Renal artery dopplers normal. Plasma catecholamines and metanephrines negative. Coronary CTA 05/2019 with normal coronary arteries. Echo 05/2019 normal LVEF 60-65%, gr2DD. Renin and aldosterone normal for hyperaldosteronism but concerning for Cushing syndrome. Referred to endocrinology who did not feel her labs were consistent with Cushing. Sleep study showed OSA recommended for CPAP. Multiple previous medication changes and intolerances followed by Dr. Duke Salvia in [facility] and pharmacy team.   Previous  intolerances: Labetalol - HA HCTZ - HA Valsartan - diarrhea Amlodipine causes headaches at 10 mg Hydralazine - headaches  She was seen by primary care 02/06/21 with BP 168/133 and antihypertensive medications resumed including Amlodipine  QD, Chlorthalidone  QD, Irbesartan  qD, Spironolactone  QD. Confirmed with pharmacy that she picked up 90 day supply on 02/06/21.   Last seen one year ago. Tells me she lost her insurance and just got insurance again in January. 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 No exertional dyspnea nor chest pain with moving large boxes of clothing. She is married. Endorses recent stress. BP at home routinely 150s-160s with her arm cuff.  In August started having shortness of breath while sitting still (I.e. watching tv). Ongoing 3 times per week lasting 30 minutes with no exacerbating nor relieving factors. No exertional dyspnea. Occasional sharp pain under her armpit and down below her left breast. She drinks a Coke and burps with not much relief. Endorses orthopnea and PND. She has been previously diagnosed with sleep apnea. She does not want CPAP due to fear of the tubing strangling her - tried to reassure - but is interested in [place]. No formal exercise routine.   EKGs/Labs/Other Studies Reviewed:   The following studies were reviewed today:  Echo 06/13/19 1. Left ventricular ejection fraction, by estimation, is 60 to 65%. The  left ventricle has normal function. The left ventricle has no regional  wall motion abnormalities. There is mild concentric left ventricular  hypertrophy. Left ventricular diastolic  parameters are consistent with Grade II diastolic dysfunction  (pseudonormalization). Elevated left ventricular end-diastolic  pressure.   2. Right ventricular systolic function is normal. The right ventricular  size is normal. There is normal pulmonary artery systolic pressure.   3. Left atrial size was mildly dilated.   4. The mitral  valve is normal in structure. Trivial mitral valve  regurgitation. No evidence of mitral stenosis.   5. The aortic valve is tricuspid. Aortic valve regurgitation is not  visualized. No aortic stenosis is present.   6. Aortic dilatation noted. There is mild dilatation of the ascending  aorta measuring 38 mm.   7. The inferior vena cava is normal in size with greater than 50%  respiratory variability, suggesting right atrial pressure of 3 mmHg.   EKG:  EKG is ordered today.  The ekg ordered today demonstrates NSR 81 bpm with left axis deviation, LVH and no acute ST/T wave changes.   Recent Labs: 06/05/2020: TSH 1.82 09/24/2020: Hemoglobin 12.2; Platelets 207 02/06/2021: ALT 7; BUN 5; Creatinine, Ser 0.75; Potassium 4.4; Sodium 145  Recent Lipid Panel    Component Value Date/Time   CHOL 164 02/06/2021 1141   TRIG 88 02/06/2021 1141   HDL 53 02/06/2021 1141   CHOLHDL 3.1 02/06/2021 1141   LDLCALC 95 02/06/2021 1141    Home Medications   Current Meds  Medication Sig   amLODipine (NORVASC) 5 MG tablet Take 1 tablet (5 mg total) by mouth daily.   aspirin (EC-81 ASPIRIN) 81 MG EC tablet Take 1 tablet (81 mg total) by mouth daily. Swallow whole.   atorvastatin (LIPITOR) 40 MG tablet Take 1 tablet (40 mg total) by mouth daily.   chlorthalidone (HYGROTON) 25 MG tablet Take 1 tablet (25 mg total) by mouth daily.   fluticasone (FLONASE) 50 MCG/ACT nasal spray Place 2 sprays into both nostrils daily.   hydroxychloroquine (PLAQUENIL) 200 MG tablet Take 1 tablet by mouth 2 (two) times a day.   irbesartan (AVAPRO) 300 MG tablet Take 1 tablet (300 mg total) by mouth daily.   pregabalin (LYRICA) 200 MG capsule Take 1 capsule (200 mg total) by mouth 2 (two) times daily.   RINVOQ 15 MG TB24 Take 1 tablet by mouth daily.   spironolactone (ALDACTONE) 100 MG tablet Take 2 tablets (200 mg total) by mouth daily.   tamsulosin (FLOMAX) 0.4 MG CAPS capsule Take 1 capsule (0.4 mg total) by mouth daily.     Review of Systems     All other systems reviewed and are otherwise negative except as noted above.  Physical Exam    VS:  BP (!) 162/110    Pulse 98    Ht 5\' 4"  (1.626 m)    Wt 173 lb 8 oz (78.7 kg)    SpO2 98%    BMI 29.78 kg/m  , BMI Body mass index is 29.78 kg/m.  Wt Readings from Last 3 Encounters:  03/04/21 173 lb 8 oz (78.7 kg)  02/06/21 173 lb (78.5 kg)  06/26/20 178 lb (80.7 kg)    GEN: Well nourished, overweight, well developed, in no acute distress. HEENT: normal. Neck: Supple, no JVD, carotid bruits, or masses. Cardiac: RRR, no murmurs, rubs, or gallops. No clubbing, cyanosis, edema.  Radials/PT 2+ and equal bilaterally.  Respiratory:  Respirations regular and unlabored, clear to auscultation bilaterally. GI: Soft, nontender, nondistended. MS: No deformity or atrophy. Skin: Warm and dry, no rash. Neuro:  Strength and sensation are intact. Psych: Normal affect.  Assessment & Plan    SOB - Ongoing since August at resting lasting 30 minutes a few  times per week with no aggravating nor relieving factors. As at rest, not concerning for angina. Consider etiology HF, pulmonary, deconditioning, anxiety. Update echo. If unrevealing, plan to follow up with primary care and consideration of pulmonology referral.  OSA - Declines CPAP trial due to fear of being strangled. Tried to reassure and offer education on various CPAP devices. Referred to ENT for consideration of Inspire.   Tobacco use - Reports Wellbutrin and gradual cessation have not worked. Offered referral to psychology, Dr. Georgiann MohsMatt Schooler, for assistance with cessation which she declined. Smoking 1 PPD. Likely contributory to her shortness of breath. Smoking cessation encouraged. Recommend utilization of 1800QUITNOW. Consider CT lung cancer screening with PCP.   Resistant HTN - Not at goal. Was off medications for nearly 1 year due to loss of insurance.  Resumed 1 month ago by PCP including Amlodipine 5mg  QD, Irbesartan  300mg  QD, Spironolactone 20mg  QD, Chlorthalidone 25mg  QD. Per pharmacy she picked up 90 day supply of all meds 02/06/21.  Low suspicion for daily compliance as BP at home 150s-160s consistent with readings in office with no change since resumption of medications.  BMP for monitoring of renal function.  Further uptitration limited by previously intolerance. Hesitant to use Imdur due to common s/e of headache. Could consider Carvedilol. Will route to Dr. Duke Salviaandolph for input.   HLD - Continue Atorvastatin. Management per PCP.   Disposition: Follow up in 6 week(s) with Chilton Siiffany Fayette, MD or APP.  Signed, Alver Sorrowaitlin S Davion Meara, NP 03/04/2021, 10:34 AM  [facility]

## 2021-03-05 ENCOUNTER — Encounter (HOSPITAL_BASED_OUTPATIENT_CLINIC_OR_DEPARTMENT_OTHER): Payer: Self-pay

## 2021-03-05 LAB — BASIC METABOLIC PANEL
BUN/Creatinine Ratio: 9 (ref 9–23)
BUN: 8 mg/dL (ref 6–24)
CO2: 25 mmol/L (ref 20–29)
Calcium: 9.3 mg/dL (ref 8.7–10.2)
Chloride: 103 mmol/L (ref 96–106)
Creatinine, Ser: 0.85 mg/dL (ref 0.57–1.00)
Glucose: 80 mg/dL (ref 70–99)
Potassium: 4.7 mmol/L (ref 3.5–5.2)
Sodium: 141 mmol/L (ref 134–144)
eGFR: 83 mL/min/{1.73_m2} (ref >59)

## 2021-03-06 ENCOUNTER — Telehealth (HOSPITAL_BASED_OUTPATIENT_CLINIC_OR_DEPARTMENT_OTHER): Payer: Self-pay | Admitting: Family

## 2021-03-06 MED ORDER — CARVEDILOL 12.5 MG PO TABS: 12.5000 mg | ORAL_TABLET | Freq: Two times a day (BID) | ORAL | 3 refills | Status: DC

## 2021-03-06 NOTE — Addendum Note (Signed)
Addended by: Marlene Lard on: 03/06/2021 01:50 PM   Modules accepted: Orders

## 2021-03-06 NOTE — Telephone Encounter (Signed)
Called patient with Gillian Shields, NP recommendations. She is agreeable to starting Carvedilol 12.5mg  BID   "Patient seen earlier this week and did not think she had Spironolactone. Per pharmacy she picked up 90 day supply. BP still elevated. Recommend addition of Carvedilol 12.5mg  BID. Continue remainder of her antihypertensive regimen.    Will route to nursing team to contact patient as she has not read MyChart message from yesterday regarding medications.    Alver Sorrow, NP"

## 2021-03-06 NOTE — Telephone Encounter (Signed)
Patient seen earlier this week and did not think she had Spironolactone. Per pharmacy she picked up 90 day supply. BP still elevated. Recommend addition of Carvedilol 12.5mg  BID. Continue remainder of her antihypertensive regimen.   Will route to nursing team to contact patient as she has not read MyChart message from yesterday regarding medications.   Alver Sorrow, NP

## 2021-03-09 ENCOUNTER — Other Ambulatory Visit (HOSPITAL_BASED_OUTPATIENT_CLINIC_OR_DEPARTMENT_OTHER): Payer: 59

## 2021-03-10 ENCOUNTER — Telehealth (HOSPITAL_BASED_OUTPATIENT_CLINIC_OR_DEPARTMENT_OTHER): Payer: Self-pay | Admitting: Family

## 2021-03-10 NOTE — Telephone Encounter (Signed)
Left message for patient to call and discuss rescheduling the Echocardiogram ordered by [name] Walker, NP 

## 2021-03-12 NOTE — Telephone Encounter (Signed)
Left message for patient to call and schedule ordered Echocardiogram

## 2021-03-17 NOTE — Progress Notes (Unsigned)
Follow-up Visit   Date: 03/17/21   Denise Carter MRN: [id number] DOB: Aug 21, 1970   Interim History: Denise Carter is a 51 y.o. right-handed African American female with RA, SLE, hyperlipidemia, and tobacco use returning to the clinic for follow-up of neuropathy.  The patient was accompanied to the clinic by self  History of present illness: She moved from [place] in 2018 and was seeing a neurologist for neuropathy last in 2016. She reports having numbness and tingling of the toes and feet and had NCS/EMG of the legs in 2015 which confirmed the presence of neuropathy.  She was being prescribed Lyrica 150mg  which she takes (2) tablets at bedtime and provides relief. She has previously tried gabapentin.  She walks unassisted and endorses mild imbalance.  She also complains of right hand tingling and is scheduled to undergo right CTS release.     She works as Copy as a Electronics engineer.  She lives with husband, daughter, son, and two grandchildren. No history of diabetes or alcohol use.  She is seeing Leafy Kindle, [place] at [facility].  Medical records received from [facility], Dr. Jerre Simon and summarized below: She was establish at their clinic from 2015-2016.  She was diagnosed with lupus in October 2014 manifesting with diffuse pain and paresthesias, which improved with steroids.  Subsequently, she developed more numbness and tingling in the legs.  Her work-up involved MRI brain, cervical spine, and thoracic spine, which was normal.  Electrodiagnostic testing showed mild sensory polyneuropathy.  UPDATE 03/17/2020:  She is here for follow-up visit.   Starting around 7pm, her legs start to burning and sting and it usually takes about 30-40 min beofre she gets relief.  She takes Lyrica 300mg  at 7pm which controls night time symptoms, but her evening pain is bothersome. She continues to work at [employer], but transferred to the clothing area,  because of difficulty opening boxes from her arthritis. She is contemplating on early retirement because she is on her feet all day on the concrete, despite reducing hours to 25hr/week, she still has significant arthritic pain.  No new falls or hospitalizations.   UPDATE 03/18/2021:  She is here for 1 year follow-up.  At her last visit, I recommended adjusting Lyrica 300mg  to 6p, ***.    Medications:  Current Outpatient Medications on File Prior to Visit  Medication Sig Dispense Refill   amLODipine (NORVASC) 5 MG tablet Take 1 tablet (5 mg total) by mouth daily. 90 tablet 3   aspirin (EC-81 ASPIRIN) 81 MG EC tablet Take 1 tablet (81 mg total) by mouth daily. Swallow whole. 30 tablet 12   atorvastatin (LIPITOR) 40 MG tablet Take 1 tablet (40 mg total) by mouth daily. 90 tablet 3   carvedilol (COREG) 12.5 MG tablet Take 1 tablet (12.5 mg total) by mouth 2 (two) times daily. 180 tablet 3   chlorthalidone (HYGROTON) 25 MG tablet Take 1 tablet (25 mg total) by mouth daily. 90 tablet 3   fluticasone (FLONASE) 50 MCG/ACT nasal spray Place 2 sprays into both nostrils daily. 91 g 1   hydroxychloroquine (PLAQUENIL) 200 MG tablet Take 1 tablet by mouth 2 (two) times a day.     irbesartan (AVAPRO) 300 MG tablet Take 1 tablet (300 mg total) by mouth daily. 90 tablet 3   pregabalin (LYRICA) 200 MG capsule Take 1 capsule (200 mg total) by mouth 2 (two) times daily. 60 capsule 5   RINVOQ 15 MG TB24 Take  1 tablet by mouth daily.     spironolactone (ALDACTONE) 100 MG tablet Take 2 tablets (200 mg total) by mouth daily. 180 tablet 3   tamsulosin (FLOMAX) 0.4 MG CAPS capsule Take 1 capsule (0.4 mg total) by mouth daily. 15 capsule 0   No current facility-administered medications on file prior to visit.    Allergies:  Allergies  Allergen Reactions   Labetalol Anxiety    Headaches Headaches    Vital Signs:  There were no vitals taken for this visit.   Neurological Exam: MENTAL STATUS including  orientation to time, place, person, recent and remote memory, attention span and concentration, language, and fund of knowledge is normal.  Speech is not dysarthric.  CRANIAL NERVES:  Normal conjugate, extra-ocular eye movements in all directions of gaze.  No ptosis.    MOTOR:  Motor strength is 5/5 in all extremities.  No atrophy, fasciculations or abnormal movements.  No pronator drift.  Tone is normal.    MSRs:  Reflexes are 2+/4 throughout, including ankles  SENSORY:  Intact to vibration throughout.  COORDINATION/GAIT:  Normal finger-to- nose-finger.  Intact rapid alternating movements bilaterally.  Gait narrow based and stable.   Data: MRI cervical spine with and without contrast 10/31/2013: No evidence of abnormal cord signal.  Less than 1 cm nodule within the left thyroid gland  MRI thoracic spine wwo contrast 11/05/2013: No evidence of abnormal thoracic cord signal or compression.  NCS/EMG 03/18/2014: Mild predominantly sensory polyneuropathy.  Review of nerve conduction shows absent right sural and superficial peroneal sensory responses and reduced radial sensory response.  Right median, ulnar, peroneal, and tibial motor responses are normal.  IMPRESSION/PLAN: Sensory neuropathy due to underlying autoimmune disease (RA and SLE) manifesting with distal sensory loss.  ***  - ***  - Adjust Lyrica to 300mg  at 6pm  - Patient educated on daily foot inspection, fall prevention, and safety precautions around the home.  Return to clinic in 1 year   Thank you for allowing me to participate in patient's care.  If I can answer any additional questions, I would be pleased to do so.    Sincerely,    Saben Donigan K. Posey Pronto, DO

## 2021-03-18 ENCOUNTER — Encounter: Payer: Self-pay | Admitting: Neurology

## 2021-03-18 ENCOUNTER — Ambulatory Visit: Payer: 59 | Admitting: Neurology

## 2021-03-18 DIAGNOSIS — Z029 Encounter for administrative examinations, unspecified: Secondary | ICD-10-CM

## 2021-03-19 ENCOUNTER — Encounter: Payer: Self-pay | Admitting: Neurology

## 2021-03-20 ENCOUNTER — Other Ambulatory Visit (HOSPITAL_BASED_OUTPATIENT_CLINIC_OR_DEPARTMENT_OTHER): Payer: 59

## 2021-03-26 ENCOUNTER — Telehealth: Payer: Self-pay | Admitting: Neurology

## 2021-03-26 NOTE — Telephone Encounter (Signed)
Patient dismissed from [facility] by Nita Sickle, DO, effective 03/19/21. Dismissal Letter sent out by 1st class mail. [employer]   ?

## 2021-04-14 ENCOUNTER — Telehealth: Payer: Self-pay

## 2021-04-14 DIAGNOSIS — Z0000 Encounter for general adult medical examination without abnormal findings: Secondary | ICD-10-CM

## 2021-04-14 NOTE — Telephone Encounter (Signed)
Called patient to inform her to bring the Vivify device back during her visit tomorrow with Gillian Shields. However, she informed me that she called yesterday and today today to reschedule her appointment along with her Echo, but no one has contacted her yet. Sent message to [place] to update her on this information and to see if someone can call the patient to assist with rescheduling. Patient is also aware that at her next appointment she needs to return the device. Patient verbally expressed her understanding. ? ?Renaee Munda, CHWC ?[facility] ?Care Guide, Health Coach ?[address] ?[place] [address] ?Telephone: [phone] ?Email: [email or web address] ? ?

## 2021-04-15 ENCOUNTER — Ambulatory Visit (HOSPITAL_BASED_OUTPATIENT_CLINIC_OR_DEPARTMENT_OTHER): Payer: 59 | Admitting: Family

## 2021-04-15 NOTE — Telephone Encounter (Signed)
Appointment canceled for today.  She has no showed two separate echocardiogram appointments.  We will need to complete office visit prior to echocardiogram. ? ?Will request nursing team to reach out to her to get her appointment rescheduled within next 1-2 weeks. Will additionally ask patient for any recent BP readings, any difficulties obtaining medications. If difficulties with cost plan to refer to SW team. ? ?Alver Sorrowaitlin S Jenina Moening, NP  ?

## 2021-04-15 NOTE — Telephone Encounter (Signed)
Left message for patient to call back ? ? ? ? ? ?"Appointment canceled for today.  She has no showed two separate echocardiogram appointments.  We will need to complete office visit prior to echocardiogram. ?  ?Will request nursing team to reach out to her to get her appointment rescheduled within next 1-2 weeks. Will additionally ask patient for any recent BP readings, any difficulties obtaining medications. If difficulties with cost plan to refer to SW team. ?  ?Alver Sorrowaitlin S Walker, NP"  ?

## 2021-05-08 ENCOUNTER — Other Ambulatory Visit: Payer: Self-pay | Admitting: Family Medicine

## 2021-05-12 ENCOUNTER — Encounter: Payer: Self-pay | Admitting: Orthopaedic Surgery

## 2021-05-12 ENCOUNTER — Ambulatory Visit: Payer: 59 | Admitting: Orthopaedic Surgery

## 2021-05-12 ENCOUNTER — Ambulatory Visit (INDEPENDENT_AMBULATORY_CARE_PROVIDER_SITE_OTHER): Payer: 59

## 2021-05-12 ENCOUNTER — Ambulatory Visit (INDEPENDENT_AMBULATORY_CARE_PROVIDER_SITE_OTHER): Payer: 59 | Admitting: Orthopaedic Surgery

## 2021-05-12 VITALS — BP 201/139 | Ht 64.0 in | Wt 173.0 lb

## 2021-05-12 DIAGNOSIS — M545 Low back pain, unspecified: Secondary | ICD-10-CM

## 2021-05-12 DIAGNOSIS — G8929 Other chronic pain: Secondary | ICD-10-CM

## 2021-05-13 NOTE — Progress Notes (Signed)
? ?Office Visit Note ?  ?Patient: Denise Carter           ?Date of Birth: Jun 07, 1970           ?MRN: [id number] ?Visit Date: 05/12/2021 ?             ?Requested by: Dorna Mai, MD ?[id number] ?[place],  [place] [address] ?PCP: Dorna Mai, MD ? ? ?Assessment & Plan: ?Visit Diagnoses:  ?1. Chronic bilateral low back pain, unspecified whether sciatica present   ? ? ?Plan: Patient had persistent problems.  She has been on Lyrica anti-inflammatories failed conservative treatment.  Patient will proceed with lumbar MRI scan for evaluation of L5-S1 right disc protrusion. ? ?Follow-Up Instructions: No follow-ups on file.  ? ?Orders:  ?Orders Placed This Encounter  ?Procedures  ? XR Lumbar Spine 2-3 Views  ? MR Lumbar Spine w/o contrast  ? ?No orders of the defined types were placed in this encounter. ? ? ? ? Procedures: ?No procedures performed ? ? ?Clinical Data: ?No additional findings. ? ? ?Subjective: ?Chief Complaint  ?Patient presents with  ? Lower Back - Pain  ? ? ?HPI 51 year old female returns with low back pain have not seen her in a year.  She has a history of having numbness in her legs.  She had an MRI scan at [place].  She has dull pain in the morning sharp pain when she comes home from work previous epidural injections in the past.  Patient had been in pain management and had multiple injections.  She was going to be scheduled for surgery in [place] at 1 point change in insurance and now is concerned that she might need the surgical procedure done locally.  Patient's husband is a patient of ours with recent surgery.  She does have a history of rheumatoid arthritis for smoking history history of Sjogren's.  Currently ? ?Review of Systems 14 point system update negative noncontributory to HPI. ? ? ?Objective: ?Vital Signs: BP (!) 201/139   Ht 5\' 4"  (1.626 m)   Wt 173 lb (78.5 kg)   BMI 29.70 kg/m?  ? ?Physical Exam ?Constitutional:   ?   Appearance: She is well-developed.   ?HENT:  ?   Head: Normocephalic.  ?   Right Ear: External ear normal.  ?   Left Ear: External ear normal. There is no impacted cerumen.  ?Eyes:  ?   Pupils: Pupils are equal, round, and reactive to light.  ?Neck:  ?   Thyroid: No thyromegaly.  ?   Trachea: No tracheal deviation.  ?Cardiovascular:  ?   Rate and Rhythm: Normal rate.  ?Pulmonary:  ?   Effort: Pulmonary effort is normal.  ?Abdominal:  ?   Palpations: Abdomen is soft.  ?Musculoskeletal:  ?   Cervical back: No rigidity.  ?Skin: ?   General: Skin is warm and dry.  ?Neurological:  ?   Mental Status: She is alert and oriented to person, place, and time.  ?Psychiatric:     ?   Behavior: Behavior normal.  ? ? ?Ortho Exam negative logroll the hips knee and ankle jerk are intact.  Patient has positive sciatic notch tenderness on the right side negative on the left.  Quad strength is good.  No sensory ? ?Specialty Comments:  ?No specialty comments available. ? ?Imaging: ?AP lateral lumbar spine images are obtained and reviewed.  This shows  ?normal pelvis.  No scoliosis.  Normal lordosis disc base height is  ?  maintained minimal facet arthropathy is noted.  There is normal lumbar  ?lordosis.  ? ?Impression: Normal lumbar spine radiographs.  Negative for acute changes. ? ? ?PMFS History: ?Patient Active Problem List  ? Diagnosis Date Noted  ? Chronic fatigue syndrome 02/06/2021  ? Overweight 02/06/2021  ? Polyarticular psoriatic arthritis ([place]) 02/06/2021  ? RLS (restless legs syndrome) 02/06/2021  ? Sjogren syndrome, unspecified ([place]) 02/06/2021  ? Vitamin D deficiency 02/06/2021  ? Rheumatoid arthritis ([place]) 02/06/2021  ? Systemic lupus erythematosus ([place]) 02/06/2021  ? Other chronic pain 02/04/2020  ? Obesity 02/04/2020  ? OSA (obstructive sleep apnea) 11/19/2019  ? Thyroid nodule 07/23/2019  ? Atypical chest pain 05/23/2019  ? Hypertension 05/23/2019  ? Snoring 05/23/2019  ? Rheumatoid arthritis without rheumatoid factor, multiple sites ([place]) 06/22/2018  ?  Polyarthralgia 06/22/2018  ? Sjogren's disease ([place]) 06/22/2018  ? Polyclonal gammopathy determined by serum protein electrophoresis 01/30/2017  ? Polyneuropathy due to systemic lupus erythematosus ([place]) 04/09/2016  ? Lumbar radiculopathy 11/27/2015  ? Neuroma digital nerve 04/03/2015  ? Plantar fasciitis 04/03/2015  ? Preop pulmonary/respiratory exam 11/28/2014  ? Smoking hx 11/28/2014  ? Dysmenorrhea 11/11/2014  ? Family hx of ovarian malignancy 11/11/2014  ? ?Past Medical History:  ?Diagnosis Date  ? Atypical chest pain 05/23/2019  ? Essential hypertension 05/23/2019  ? Hypertension   ? Lupus ([place])   ? OSA (obstructive sleep apnea) 11/19/2019  ? Shortness of breath 05/23/2019  ? Snoring 05/23/2019  ? Thyroid disease   ? Varicose veins of left lower extremity 01/26/2020  ?  ?Family History  ?Problem Relation Age of Onset  ? Hypertension Mother   ? Hypertension Maternal Grandmother   ? Lupus Maternal Grandmother   ? Heart failure Maternal Grandmother   ? Stroke Maternal Grandmother   ? CAD Maternal Grandmother   ? Adrenal disorder Neg Hx   ?  ?Past Surgical History:  ?Procedure Laterality Date  ? ABDOMINAL HYSTERECTOMY    ? ?Social History  ? ?Occupational History  ? Not on file  ?Tobacco Use  ? Smoking status: Every Day  ?  Packs/day: 1.00  ?  Types: Cigarettes  ? Smokeless tobacco: Never  ? Tobacco comments:  ?  Patient is participating in health coaching for smoking cessation.   ?Vaping Use  ? Vaping Use: Never used  ?Substance and Sexual Activity  ? Alcohol use: No  ? Drug use: No  ? Sexual activity: Not on file  ? ? ? ? ? ? ?

## 2021-05-15 ENCOUNTER — Ambulatory Visit (INDEPENDENT_AMBULATORY_CARE_PROVIDER_SITE_OTHER): Payer: 59

## 2021-05-15 ENCOUNTER — Encounter (HOSPITAL_BASED_OUTPATIENT_CLINIC_OR_DEPARTMENT_OTHER): Payer: Self-pay

## 2021-05-15 DIAGNOSIS — R0602 Shortness of breath: Secondary | ICD-10-CM | POA: Diagnosis not present

## 2021-05-15 DIAGNOSIS — I10 Essential (primary) hypertension: Secondary | ICD-10-CM | POA: Diagnosis not present

## 2021-05-15 LAB — ECHOCARDIOGRAM COMPLETE
AR max vel: 2.62 cm2
AV Area VTI: 2.74 cm2
AV Area mean vel: 2.63 cm2
AV Mean grad: 4 mmHg
AV Peak grad: 6.5 mmHg
AV Vena cont: 0.26 cm
Ao pk vel: 1.27 m/s
Area-P 1/2: 4.01 cm2
Calc EF: 65.6 %
S' Lateral: 2.22 cm
Single Plane A2C EF: 68.3 %
Single Plane A4C EF: 61.1 %

## 2021-05-16 ENCOUNTER — Encounter (HOSPITAL_BASED_OUTPATIENT_CLINIC_OR_DEPARTMENT_OTHER): Payer: Self-pay | Admitting: Cardiovascular Disease

## 2021-05-17 ENCOUNTER — Ambulatory Visit
Admission: RE | Admit: 2021-05-17 | Discharge: 2021-05-17 | Disposition: A | Discharge status: Home / Self Care | Payer: 59 | Source: Ambulatory Visit | Attending: Orthopaedic Surgery | Admitting: Orthopaedic Surgery

## 2021-05-17 DIAGNOSIS — M545 Low back pain, unspecified: Secondary | ICD-10-CM

## 2021-05-17 NOTE — Progress Notes (Signed)
? ?Office Visit  ?  ?Patient Name: Denise Carter ?Date of Encounter: 05/18/2021 ? ?PCP:  Georganna SkeansWilson, Amelia, MD ?  ? [facility]  ?Cardiologist:  Chilton Siiffany Trooper, MD  ?Advanced Practice Provider:  No care team member to display ?Electrophysiologist:  None  ?   ? ?Chief Complaint  ?  ?Denise Carter is a 51 y.o. female with a hx of RA, Sjorgren's, SLE, resistant hypertension, OSA, tobacco use, varicose veins, hyperlipidemia presents today for follow up of hypertension. ? ?Past Medical History  ?  ?Past Medical History:  ?Diagnosis Date  ? Atypical chest pain 05/23/2019  ? Essential hypertension 05/23/2019  ? Hypertension   ? Lupus (HCC)   ? OSA (obstructive sleep apnea) 11/19/2019  ? Shortness of breath 05/23/2019  ? Snoring 05/23/2019  ? Thyroid disease   ? Varicose veins of left lower extremity 01/26/2020  ? ?Past Surgical History:  ?Procedure Laterality Date  ? ABDOMINAL HYSTERECTOMY    ? ? ?Allergies ? ?Allergies  ?Allergen Reactions  ? Labetalol Anxiety  ?  Headaches ?Headaches  ? ? ?History of Present Illness  ?  ?Denise Carter is a 51 y.o. female with a hx of RA, Sjorgren's, SLE, resistant hypertension, OSA, tobacco use, varicose veins, hyperlipidemia  last seen 03/04/21. ? ?Hypertension initially diagnosed in 2015 at time of SLE diagnosis. BP never well controlled. Previously seen by Dr. Eden EmmsNishan but has transitioned to Dr. Duke Salviaandolph. Renal artery dopplers normal. Plasma catecholamines and metanephrines negative. Coronary CTA 05/2019 with normal coronary arteries. Echo 05/2019 normal LVEF 60-65%, gr2DD. Renin and aldosterone normal for hyperaldosteronism but concerning for Cushing syndrome. Referred to endocrinology who did not feel her labs were consistent with Cushing. Sleep study showed OSA recommended for CPAP. Multiple previous medication changes and intolerances followed by Dr. Duke Salviaandolph [facility] and pharmacy team.  ? ?She was seen by primary care 02/06/21  with BP 168/133 and antihypertensive medications resumed including Amlodipine 5mg  QD, Chlorthalidone 25mg  QD, Irbesartan 300mg  qD, Spironolactone 200mg  QD. Confirmed with pharmacy that she picked up 90 day supply on 02/06/21.  ? ?She was seen 03/04/20 then lost to follow up for 1 year due to loss of insurance. Was seen to re-establish care 03/04/21. 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 BP at home 150-160 with arm cuff though elevated in clinic. Referred to ENT for inspir as did not tolerate CPAP due to fear of tube strangling her.  Coreg added to regimen and echo ordered due to dyspnea. Echo 04/2021 LVEF 60-65%, no rWMA, mild LVH, gr1DD, mild AI. She was referred to pulmonology.  ? ?Presents today for follow up. Reviewed echocardiogram in detail. Remains frustrated by her blood pressure with home readings 150s-160s, HR 80s. When see by orthopedics last week BP in clinic 201/139 after working 8 hour shift but had not taken medications, she was recommended for lumbar MRI scan for evaluatio of L5-S1 protrusion. Lengthy discussion in clinic of resistant hypertension with likely etiology untreated OSA as well as genetics with strong family history of hypertension - appointment upcoming end of June to discuss possible Inspire device with ENT. Prior renal duplex 2019 with no renal artery stenosis. Thyroid panel 05/2020 unremarkable. Reports following low salt diet though notes her husband does most of the cooking and that last night had meatloaf and mashed potatoes for dinner. Working to quit smoking - has cut back for 2 weeks though difficult as her husband smokes. ? ?Previous intolerances: ?Labetalol - headaches ?HCTZ - headaches ?  Valsartan - diarrhea ?Amlodipine causes headaches at 10 mg, but tolerates 5mg  ?Hydralazine - headaches ? ?EKGs/Labs/Other Studies Reviewed:  ? ?The following studies were reviewed today: ? ?Echo  05/15/21 ? 1. Left ventricular ejection fraction, by estimation, is 60 to 65%. Left  ?ventricular  ejection fraction by 3D volume is 59 %. The left ventricle has  ?normal function. The left ventricle has no regional wall motion  ?abnormalities. There is mild concentric  ?left ventricular hypertrophy. Left ventricular diastolic parameters are  ?consistent with Grade I diastolic dysfunction (impaired relaxation).  ? 2. Right ventricular systolic function is normal. The right ventricular  ?size is normal. There is normal pulmonary artery systolic pressure. The  ?estimated right ventricular systolic pressure is 22.9 mmHg.  ? 3. Left atrial size was moderately dilated.  ? 4. The mitral valve is grossly normal. No evidence of mitral valve  ?regurgitation. The mean mitral valve gradient is 1.0 mmHg.  ? 5. The aortic valve is tricuspid. Aortic valve regurgitation is mild. No  ?aortic stenosis is present.  ? 6. The inferior vena cava is normal in size with greater than 50%  ?respiratory variability, suggesting right atrial pressure of 3 mmHg.  ? ?Comparison(s): No significant change from prior study. EF 60%.  ? ?Echo 06/13/19 ?1. Left ventricular ejection fraction, by estimation, is 60 to 65%. The  ?left ventricle has normal function. The left ventricle has no regional  ?wall motion abnormalities. There is mild concentric left ventricular  ?hypertrophy. Left ventricular diastolic  ?parameters are consistent with Grade II diastolic dysfunction  ?(pseudonormalization). Elevated left ventricular end-diastolic pressure.  ? 2. Right ventricular systolic function is normal. The right ventricular  ?size is normal. There is normal pulmonary artery systolic pressure.  ? 3. Left atrial size was mildly dilated.  ? 4. The mitral valve is normal in structure. Trivial mitral valve  ?regurgitation. No evidence of mitral stenosis.  ? 5. The aortic valve is tricuspid. Aortic valve regurgitation is not  ?visualized. No aortic stenosis is present.  ? 6. Aortic dilatation noted. There is mild dilatation of the ascending  ?aorta measuring 38  mm.  ? 7. The inferior vena cava is normal in size with greater than 50%  ?respiratory variability, suggesting right atrial pressure of 3 mmHg.  ? ?EKG:  No EKG today ? ?Recent Labs: ?06/05/2020: TSH 1.82 ?09/24/2020: Hemoglobin 12.2; Platelets 207 ?02/06/2021: ALT 7 ?03/04/2021: BUN 8; Creatinine, Ser 0.85; Potassium 4.7; Sodium 141  ?Recent Lipid Panel ?   ?Component Value Date/Time  ? CHOL 164 02/06/2021 1141  ? TRIG 88 02/06/2021 1141  ? HDL 53 02/06/2021 1141  ? CHOLHDL 3.1 02/06/2021 1141  ? LDLCALC 95 02/06/2021 1141  ? ? ?Home Medications  ? ?Current Meds  ?Medication Sig  ? amLODipine (NORVASC) 5 MG tablet Take 1 tablet (5 mg total) by mouth daily.  ? aspirin (EC-81 ASPIRIN) 81 MG EC tablet Take 1 tablet (81 mg total) by mouth daily. Swallow whole.  ? atorvastatin (LIPITOR) 40 MG tablet Take 1 tablet (40 mg total) by mouth daily.  ? carvedilol (COREG) 12.5 MG tablet Take 1 tablet (12.5 mg total) by mouth 2 (two) times daily.  ? chlorthalidone (HYGROTON) 25 MG tablet Take 1 tablet (25 mg total) by mouth daily.  ? fluticasone (FLONASE) 50 MCG/ACT nasal spray Place 2 sprays into both nostrils daily.  ? ibuprofen (ADVIL) 200 MG tablet Take 200 mg by mouth every 6 (six) hours as needed.  ? irbesartan (AVAPRO) 300 MG tablet  Take 1 tablet (300 mg total) by mouth daily.  ? pregabalin (LYRICA) 200 MG capsule Take 1 capsule (200 mg total) by mouth 2 (two) times daily.  ? spironolactone (ALDACTONE) 100 MG tablet Take 2 tablets (200 mg total) by mouth daily.  ? tamsulosin (FLOMAX) 0.4 MG CAPS capsule Take 1 capsule (0.4 mg total) by mouth daily.  ?  ?Review of Systems  ?   ?All other systems reviewed and are otherwise negative except as noted above. ? ?Physical Exam  ?  ?VS:  BP 106/80   Pulse 76   Ht  (1.626 m)   Wt 173 lb 1.6 oz (78.5 kg)   SpO2 98%   BMI 29.71 kg/m?  , BMI Body mass index is 29.71 kg/m?. ? ?Wt Readings from Last 3 Encounters:  ?05/18/21 173 lb 1.6 oz (78.5 kg)  ?05/12/21 173 lb (78.5 kg)   ?03/04/21 173 lb 8 oz (78.7 kg)  ?  ?GEN: Well nourished, overweight, well developed, in no acute distress. ?HEENT: normal. ?Neck: Supple, no JVD, carotid bruits, or masses. ?Cardiac: RRR, no murmurs, rubs, or gallops.

## 2021-05-18 ENCOUNTER — Ambulatory Visit (INDEPENDENT_AMBULATORY_CARE_PROVIDER_SITE_OTHER): Payer: 59 | Admitting: Family

## 2021-05-18 ENCOUNTER — Encounter (HOSPITAL_BASED_OUTPATIENT_CLINIC_OR_DEPARTMENT_OTHER): Payer: Self-pay | Admitting: Family

## 2021-05-18 VITALS — BP 138/88 | HR 76 | Ht 64.0 in | Wt 173.1 lb

## 2021-05-18 DIAGNOSIS — G4733 Obstructive sleep apnea (adult) (pediatric): Secondary | ICD-10-CM

## 2021-05-18 DIAGNOSIS — I10 Essential (primary) hypertension: Secondary | ICD-10-CM

## 2021-05-18 DIAGNOSIS — R0602 Shortness of breath: Secondary | ICD-10-CM

## 2021-05-18 DIAGNOSIS — E782 Mixed hyperlipidemia: Secondary | ICD-10-CM

## 2021-05-18 DIAGNOSIS — Z720 Tobacco use: Secondary | ICD-10-CM

## 2021-05-18 MED ORDER — CARVEDILOL 25 MG PO TABS
25.0000 mg | ORAL_TABLET | Freq: Two times a day (BID) | ORAL | 3 refills | Status: DC
Start: 2021-05-18 — End: 2021-11-09

## 2021-05-18 NOTE — Patient Instructions (Addendum)
Medication Instructions:  ?Your physician has recommended you make the following change in your medication:   ? ?CHANGE Carvedilol to  twice daily ? ?*Please take doses 12 hours apart ? ?CONTINUE Chlorthalidone, Spironolactone in the morning ? ?CHANGE Amlodipine, Valsartan to the evening ? ?*If you have taken your medications this morning - start tomorrow evening ?*if you have not taken your medications this morning - start tonight ? ?*If you need a refill on your cardiac medications before your next appointment, please call your pharmacy* ? ? ?Lab Work: ?None ordered today.  ? ?Testing/Procedures: ? ?We have referred you for a lung cancer screening due to your tobacco use. This is a routine screening.  ? ?We have ordered a 24 hour blood pressure monitor. They will contact you to schedule.  ? ?Follow-Up: ?At [facility], you and your health needs are our priority.  As part of our continuing mission to provide you with exceptional heart care, we have created designated Provider Care Teams.  These Care Teams include your primary Cardiologist (physician) and Advanced Practice Providers (APPs -  Physician Assistants and Nurse Practitioners) who all work together to provide you with the care you need, when you need it. ? ?We recommend signing up for the patient portal called "MyChart".  Sign up information is provided on this After Visit Summary.  MyChart is used to connect with patients for Virtual Visits (Telemedicine).  Patients are able to view lab/test results, encounter notes, upcoming appointments, etc.  Non-urgent messages can be sent to your provider as well.   ?To learn more about what you can do with MyChart, go to [email or web address].   ? ?Your next appointment:   ?2 month(s) ? ?The format for your next appointment:   ?In Person ? ?Provider:   ?Chilton Si, MD  ? ? ?Other Instructions ? ?Tips to Measure your Blood Pressure Correctly ? ?Here's what you can do to ensure a correct reading: ?  Don't drink a caffeinated beverage or smoke during the 30 minutes before the test. ? Sit quietly for five minutes before the test begins. ? During the measurement, sit in a chair with your feet on the floor and your arm supported so your elbow is at about heart level. ? The inflatable part of the cuff should completely cover at least 80% of your upper arm, and the cuff should be placed on bare skin, not over a shirt. ? Don't talk during the measurement. ? ?Blood pressure categories  ?Blood pressure category SYSTOLIC ?(upper number)  DIASTOLIC ?(lower number)  ?Normal Less than 120 mm Hg and Less than 80 mm Hg  ?Elevated 120-129 mm Hg and Less than 80 mm Hg  ?High blood pressure: Stage 1 hypertension 130-139 mm Hg or 80-89 mm Hg  ?High blood pressure: Stage 2 hypertension 140 mm Hg or higher or 90 mm Hg or higher  ?Hypertensive crisis (consult your doctor immediately) Higher than 180 mm Hg and/or Higher than 120 mm Hg  ?Source: American Heart Association and American Stroke Association. ?For more on getting your blood pressure under control, buy Controlling Your Blood Pressure, a Special Health Report from [facility]. ? ? ?Blood Pressure Log ? ? ?Date ?  ?Time  ?Blood Pressure  ?Example: Nov 1 9 AM 124/78  ? ?    ? ?    ? ?    ? ?    ? ?    ? ?    ? ?    ? ?    ? ? ?  ? ?  Important Information About Sugar ? ? ? ? ?  ?

## 2021-05-18 NOTE — Telephone Encounter (Signed)
Addressed at appointment 4/24 ?

## 2021-05-19 ENCOUNTER — Ambulatory Visit: Payer: 59 | Admitting: Orthopaedic Surgery

## 2021-05-20 ENCOUNTER — Other Ambulatory Visit (HOSPITAL_BASED_OUTPATIENT_CLINIC_OR_DEPARTMENT_OTHER): Payer: Self-pay | Admitting: Family

## 2021-05-20 DIAGNOSIS — I10 Essential (primary) hypertension: Secondary | ICD-10-CM

## 2021-05-20 DIAGNOSIS — R030 Elevated blood-pressure reading, without diagnosis of hypertension: Secondary | ICD-10-CM

## 2021-05-22 ENCOUNTER — Encounter (HOSPITAL_BASED_OUTPATIENT_CLINIC_OR_DEPARTMENT_OTHER): Payer: Self-pay

## 2021-05-22 ENCOUNTER — Telehealth (HOSPITAL_BASED_OUTPATIENT_CLINIC_OR_DEPARTMENT_OTHER): Payer: Self-pay

## 2021-05-22 ENCOUNTER — Ambulatory Visit (HOSPITAL_BASED_OUTPATIENT_CLINIC_OR_DEPARTMENT_OTHER)
Admission: RE | Admit: 2021-05-22 | Discharge: 2021-05-22 | Disposition: A | Discharge status: Home / Self Care | Payer: 59 | Source: Ambulatory Visit | Attending: Family | Admitting: Family

## 2021-05-22 DIAGNOSIS — Z720 Tobacco use: Secondary | ICD-10-CM | POA: Diagnosis present

## 2021-05-22 NOTE — Telephone Encounter (Signed)
Referral to pulmonology placed for patient per Laurann Montana, NP  ?

## 2021-05-22 NOTE — Telephone Encounter (Addendum)
Repeat cancer screening ordered, resulted released to mychart ? ? ? ?----- Message from Alver Sorrow, NP sent at 05/22/2021 11:45 AM EDT ----- ?Cardiac CT with mild left lobe scarring and small right upper lobe nodule 3.26mm. Repeat CT lung cancer screening in 1 year. ?

## 2021-05-23 ENCOUNTER — Encounter: Payer: Self-pay | Admitting: Orthopaedic Surgery

## 2021-06-02 ENCOUNTER — Ambulatory Visit (INDEPENDENT_AMBULATORY_CARE_PROVIDER_SITE_OTHER): Payer: 59 | Admitting: Orthopaedic Surgery

## 2021-06-02 VITALS — BP 159/98 | HR 90

## 2021-06-02 DIAGNOSIS — M5416 Radiculopathy, lumbar region: Secondary | ICD-10-CM | POA: Diagnosis not present

## 2021-06-02 DIAGNOSIS — M329 Systemic lupus erythematosus, unspecified: Secondary | ICD-10-CM | POA: Diagnosis not present

## 2021-06-02 DIAGNOSIS — IMO0002 Reserved for concepts with insufficient information to code with codable children: Secondary | ICD-10-CM

## 2021-06-02 NOTE — Progress Notes (Signed)
? ?Office Visit Note ?  ?Patient: Denise Carter           ?Date of Birth: Mar 01, 1970           ?MRN: [id number] ?Visit Date: 06/02/2021 ?             ?Requested by: Georganna Skeans, MD ?[phone] ?[place],  [place] [address] ?PCP: Georganna Skeans, MD ? ? ?Assessment & Plan: ?Visit Diagnoses:  ?1. Lupus (HCC)   ?2. Lumbar radiculopathy   ? ? ?Plan: We reviewed her MRI scan images and I gave her a copy of the report.  She has 3 mm anterolisthesis at L4-5 with some degenerative facet changes at L4-5 and L5-S1.  Imaging findings are similar to previous MRI 02/02/2020.  Patient's had previous treatment with rheumatology and requesting referral for rheumatology appointment.  I discussed with her at this point no recommendations based on current MRI for operative intervention.  She does have some tiny synovial cyst but there posterior and not causing any compression.  No central stenosis and only mild right foraminal narrowing at L4-5 and mild foraminal narrowing right and left at L5-S1. ? ?Follow-Up Instructions: Return if symptoms worsen or fail to improve.  ? ?Orders:  ?Orders Placed This Encounter  ?Procedures  ? Ambulatory referral to Rheumatology  ? ?No orders of the defined types were placed in this encounter. ? ? ? ? Procedures: ?No procedures performed ? ? ?Clinical Data: ?No additional findings. ? ? ?Subjective: ?Chief Complaint  ?Patient presents with  ? Lower Back - Pain, Follow-up  ?  S/p MRI  ? ? ?HPI 51 year old female seen in follow-up from 05/12/2021 post MRI scan.  Patient states she has persistent back pain present greater than a year.  She states she was scheduled for surgery in [place] at 1 point but then moved.  She has a history of rheumatoid arthritis questionable history of Sjogren syndrome positive for smoking history.  Patient has been treated in the past with rheumatologic agents see listed multiple.  Currently taking ibuprofen also baby aspirin.  She has been on Plaquenil in the  past.  She is seeing Dr. Allena Katz February 2022 with electrical test showing lower extremity neuropathy.  She has been on gabapentin.  MRI from 05/17/2021 is available for review. ? ?Review of Systems history of Sjogren syndrome, SLE low listed.  Monoclonal gammopathy.  All other systems noncontributory to HPI. ? ? ?Objective: ?Vital Signs: BP (!) 159/98   Pulse 90  ? ?Physical Exam ?Constitutional:   ?   Appearance: She is well-developed.  ?HENT:  ?   Head: Normocephalic.  ?   Right Ear: External ear normal.  ?   Left Ear: External ear normal. There is no impacted cerumen.  ?Eyes:  ?   Pupils: Pupils are equal, round, and reactive to light.  ?Neck:  ?   Thyroid: No thyromegaly.  ?   Trachea: No tracheal deviation.  ?Cardiovascular:  ?   Rate and Rhythm: Normal rate.  ?Pulmonary:  ?   Effort: Pulmonary effort is normal.  ?Abdominal:  ?   Palpations: Abdomen is soft.  ?Musculoskeletal:  ?   Cervical back: No rigidity.  ?Skin: ?   General: Skin is warm and dry.  ?Neurological:  ?   Mental Status: She is alert and oriented to person, place, and time.  ?Psychiatric:     ?   Behavior: Behavior normal.  ? ? ?Ortho Exam patient has good hip range of  motion normal heel-toe gait.  Some sciatic notch tenderness both right and left.  Quad strength are good knee crease full extension.  No synovitis of the fingers or wrist.  Elbows reach full extension. ? ?Specialty Comments:  ?No specialty comments available. ? ?Imaging: ?CLINICAL DATA:  Low back pain with persistent symptoms. Pain extends ?into the right leg with some right leg numbness. ?  ?EXAM: ?MRI LUMBAR SPINE WITHOUT CONTRAST ?  ?TECHNIQUE: ?Multiplanar, multisequence MR imaging of the lumbar spine was ?performed. No intravenous contrast was administered. ?  ?COMPARISON:  Radiographs 05/12/2021 and prior MRI dated 02/02/2020 ?  ?FINDINGS: ?Segmentation: The lowest lumbar type non-rib-bearing vertebra is ?labeled as L5. ?  ?Alignment:  3 mm degenerative anterolisthesis  L4-5. ?  ?Vertebrae: Disc desiccation at L5-S1. Type 2 degenerative endplate ?findings L3-4 with type 1 degenerative endplate findings at L4-5. ?  ?Conus medullaris and cauda equina: Conus extends to the L1 level. ?Conus and cauda equina appear normal. ?  ?Paraspinal and other soft tissues: Pancreas divisum. ?  ?Disc levels: ?  ?T12-L1: Probable synovial cyst posterior to the facet joint ?measuring 1.6 by 0.4 by 1.8 cm, not currently causing impingement. ?  ?L1-2: Small synovial cyst adjacent to the right facet joint, 0.6 by ?0.3 cm on image 9 series 13, not causing impingement. ?  ?L2-3: No impingement.  Minimal disc bulge. ?  ?L3-4: Borderline right foraminal stenosis due to disc bulge. ?  ?L4-5: Mild left and borderline right foraminal stenosis due to disc ?uncovering, disc bulge, intervertebral spurring, and facet spurring. ?Small left synovial cyst below the facet joint, not contributing to ?impingement. Roughly similar to prior. ?  ?L5-S1: Mild bilateral foraminal stenosis due to intervertebral and ?facet spurring along with mild disc bulge, similar magnitude to ?prior exam. ?  ?IMPRESSION: ?1. Lumbar spondylosis and degenerative disc disease causing mild ?impingement at L4-5 and L5-S1, roughly similar to the prior exam. ?2. Several small synovial cysts are not currently in a position to ?cause impingement. ?  ?  ?Electronically Signed ?  By: Gaylyn Rong M.D. ?  On: 05/20/2021 13:01 ? ? ?PMFS History: ?Patient Active Problem List  ? Diagnosis Date Noted  ? Chronic fatigue syndrome 02/06/2021  ? Overweight 02/06/2021  ? Polyarticular psoriatic arthritis (HCC) 02/06/2021  ? RLS (restless legs syndrome) 02/06/2021  ? Sjogren syndrome, unspecified (HCC) 02/06/2021  ? Vitamin D deficiency 02/06/2021  ? Rheumatoid arthritis (HCC) 02/06/2021  ? Systemic lupus erythematosus (HCC) 02/06/2021  ? Other chronic pain 02/04/2020  ? Obesity 02/04/2020  ? OSA (obstructive sleep apnea) 11/19/2019  ? Thyroid nodule  07/23/2019  ? Atypical chest pain 05/23/2019  ? Hypertension 05/23/2019  ? Snoring 05/23/2019  ? Rheumatoid arthritis without rheumatoid factor, multiple sites (HCC) 06/22/2018  ? Polyarthralgia 06/22/2018  ? Sjogren's disease (HCC) 06/22/2018  ? Polyclonal gammopathy determined by serum protein electrophoresis 01/30/2017  ? Polyneuropathy due to systemic lupus erythematosus (HCC) 04/09/2016  ? Lumbar radiculopathy 11/27/2015  ? Neuroma digital nerve 04/03/2015  ? Plantar fasciitis 04/03/2015  ? Preop pulmonary/respiratory exam 11/28/2014  ? Smoking hx 11/28/2014  ? Dysmenorrhea 11/11/2014  ? Family hx of ovarian malignancy 11/11/2014  ? ?Past Medical History:  ?Diagnosis Date  ? Atypical chest pain 05/23/2019  ? Essential hypertension 05/23/2019  ? Hypertension   ? Lupus (HCC)   ? OSA (obstructive sleep apnea) 11/19/2019  ? Shortness of breath 05/23/2019  ? Snoring 05/23/2019  ? Thyroid disease   ? Varicose veins of left lower extremity 01/26/2020  ?  ?  Family History  ?Problem Relation Age of Onset  ? Hypertension Mother   ? Hypertension Maternal Grandmother   ? Lupus Maternal Grandmother   ? Heart failure Maternal Grandmother   ? Stroke Maternal Grandmother   ? CAD Maternal Grandmother   ? Adrenal disorder Neg Hx   ?  ?Past Surgical History:  ?Procedure Laterality Date  ? ABDOMINAL HYSTERECTOMY    ? ?Social History  ? ?Occupational History  ? Not on file  ?Tobacco Use  ? Smoking status: Every Day  ?  Packs/day: 1.00  ?  Types: Cigarettes  ?  Start date: 01/26/1991  ? Smokeless tobacco: Never  ? Tobacco comments:  ?  Patient is participating in health coaching for smoking cessation.   ?Vaping Use  ? Vaping Use: Never used  ?Substance and Sexual Activity  ? Alcohol use: No  ? Drug use: No  ? Sexual activity: Not on file  ? ? ? ? ? ? ?

## 2021-06-09 ENCOUNTER — Ambulatory Visit: Payer: 59 | Admitting: Orthopaedic Surgery

## 2021-06-18 ENCOUNTER — Encounter: Payer: Self-pay | Admitting: Pulmonary Disease

## 2021-06-18 ENCOUNTER — Encounter: Payer: Self-pay | Admitting: Orthopaedic Surgery

## 2021-06-18 ENCOUNTER — Ambulatory Visit (INDEPENDENT_AMBULATORY_CARE_PROVIDER_SITE_OTHER): Payer: 59 | Admitting: Pulmonary Disease

## 2021-06-18 ENCOUNTER — Telehealth: Payer: Self-pay | Admitting: Orthopaedic Surgery

## 2021-06-18 VITALS — BP 118/72 | HR 95 | Temp 98.5°F | Ht 64.0 in | Wt 179.0 lb

## 2021-06-18 DIAGNOSIS — R911 Solitary pulmonary nodule: Secondary | ICD-10-CM

## 2021-06-18 DIAGNOSIS — R0609 Other forms of dyspnea: Secondary | ICD-10-CM

## 2021-06-18 DIAGNOSIS — J438 Other emphysema: Secondary | ICD-10-CM

## 2021-06-18 MED ORDER — SPIRIVA RESPIMAT 1.25 MCG/ACT IN AERS: 2.0000 | INHALATION_SPRAY | Freq: Every day | RESPIRATORY_TRACT | 5 refills | Status: DC

## 2021-06-18 MED ORDER — ALBUTEROL SULFATE HFA 108 (90 BASE) MCG/ACT IN AERS: 2.0000 | INHALATION_SPRAY | Freq: Four times a day (QID) | RESPIRATORY_TRACT | 6 refills | Status: DC | PRN

## 2021-06-18 NOTE — Progress Notes (Signed)
Denise Carter    [id number]    03-24-1970  Primary Care Physician:Wilson, Clyde Canterbury, MD  Referring Physician: Loel Dubonnet, NP [address] [place],  [place] [address]  Chief complaint:   Patient is being seen for shortness of breath  HPI:  Active smoker with shortness of breath She continues to smoke actively, failed Chantix in the past  Underlying history of obstructive sleep apnea Sjogren's syndrome Chronic fatigue syndrome Polyneuropathy due to lupus History of rheumatoid arthritis Psoriatic arthritis Polyclonal gammopathy  No pertinent occupational history  Smokes about a pack of cigarettes a day  Recent CT with lung nodules and emphysema  Outpatient Encounter Medications as of 06/18/2021  Medication Sig   albuterol (VENTOLIN HFA) 108 (90 Base) MCG/ACT inhaler Inhale 2 puffs into the lungs every 6 (six) hours as needed for wheezing or shortness of breath.   amLODipine (NORVASC) 5 MG tablet Take 1 tablet (5 mg total) by mouth daily.   aspirin (EC-81 ASPIRIN) 81 MG EC tablet Take 1 tablet (81 mg total) by mouth daily. Swallow whole.   atorvastatin (LIPITOR) 40 MG tablet Take 1 tablet (40 mg total) by mouth daily.   carvedilol (COREG) 25 MG tablet Take 1 tablet (25 mg total) by mouth 2 (two) times daily.   chlorthalidone (HYGROTON) 25 MG tablet Take 1 tablet (25 mg total) by mouth daily.   fluticasone (FLONASE) 50 MCG/ACT nasal spray Place 2 sprays into both nostrils daily.   hydroxychloroquine (PLAQUENIL) 200 MG tablet Take 1 tablet by mouth 2 (two) times a day.   ibuprofen (ADVIL) 200 MG tablet Take 200 mg by mouth every 6 (six) hours as needed.   irbesartan (AVAPRO) 300 MG tablet Take 1 tablet (300 mg total) by mouth daily.   pregabalin (LYRICA) 200 MG capsule Take 1 capsule (200 mg total) by mouth 2 (two) times daily.   RINVOQ 15 MG TB24 Take 1 tablet by mouth daily.   spironolactone (ALDACTONE) 100 MG tablet Take 2 tablets (200 mg  total) by mouth daily.   tamsulosin (FLOMAX) 0.4 MG CAPS capsule Take 1 capsule (0.4 mg total) by mouth daily.   Tiotropium Bromide Monohydrate (SPIRIVA RESPIMAT) 1.25 MCG/ACT AERS Inhale 2 puffs into the lungs daily.   No facility-administered encounter medications on file as of 06/18/2021.    Allergies as of 06/18/2021 - Review Complete 06/18/2021  Allergen Reaction Noted   Labetalol Anxiety 10/25/2018    Past Medical History:  Diagnosis Date   Atypical chest pain 05/23/2019   Essential hypertension 05/23/2019   Hypertension    Lupus (HCC)    OSA (obstructive sleep apnea) 11/19/2019   Shortness of breath 05/23/2019   Snoring 05/23/2019   Thyroid disease    Varicose veins of left lower extremity 01/26/2020    Past Surgical History:  Procedure Laterality Date   ABDOMINAL HYSTERECTOMY      Family History  Problem Relation Age of Onset   Hypertension Mother    Hypertension Maternal Grandmother    Lupus Maternal Grandmother    Heart failure Maternal Grandmother    Stroke Maternal Grandmother    CAD Maternal Grandmother    Adrenal disorder Neg Hx     Social History   Socioeconomic History   Marital status: Married    Spouse name: Not on file   Number of children: Not on file   Years of education: Not on file   Highest education level: Not on file  Occupational History   Not on file  Tobacco Use   Smoking status: Every Day    Packs/day: 1.00    Types: Cigarettes    Start date: 01/26/1991   Smokeless tobacco: Never   Tobacco comments:    Patient is participating in health coaching for smoking cessation.   Vaping Use   Vaping Use: Never used  Substance and Sexual Activity   Alcohol use: No   Drug use: No   Sexual activity: Not on file  Other Topics Concern   Not on file  Social History Narrative   Right handed      Highest level of edu- 12th grade      7 children      Apartment- 2nd floor   Social Determinants of Health   Financial Resource Strain: Not  on file  Food Insecurity: Not on file  Transportation Needs: Not on file  Physical Activity: Not on file  Stress: Not on file  Social Connections: Not on file  Intimate Partner Violence: Not on file    Review of Systems  Constitutional:  Positive for fatigue.  Respiratory:  Positive for shortness of breath.   Psychiatric/Behavioral:  Positive for sleep disturbance.    Vitals:   06/18/21 0948  BP: 118/72  Pulse: 95  Temp: 98.5 F (36.9 C)  SpO2: 100%     Physical Exam Constitutional:      Appearance: She is obese.  HENT:     Head: Normocephalic.     Mouth/Throat:     Mouth: Mucous membranes are moist.  Cardiovascular:     Rate and Rhythm: Normal rate and regular rhythm.     Heart sounds: No murmur heard.   No friction rub.  Pulmonary:     Effort: No respiratory distress.     Breath sounds: No stridor. No wheezing or rhonchi.  Musculoskeletal:     Cervical back: No rigidity or tenderness.  Neurological:     Mental Status: She is alert.  Psychiatric:        Mood and Affect: Mood normal.   Data Reviewed: CT with 3 mm lung nodule, emphysema 05/22/2021-reviewed by myself  Most recent sleep study with mild obstructive sleep apnea Assessment:  Active smoker  Lung nodule  Emphysema  History of lupus, history of rheumatoid arthritis  Not currently on any inhalers  Pathophysiology of shortness of breath was discussed  Plan/Recommendations:  Smoking cessation counseling  Encouraged to follow-up with rheumatology  Encouraged to follow-up with sleep apnea therapy  Prescription for albuterol and Spiriva sent to pharmacy for patient  We will evaluate with a pulmonary function test at next visit  Low-dose CT scan of the chest in a year  She does have multiple underlying rheumatological diagnosis that may manifest in the lungs as nodules   The importance of quitting smoking was reiterated  Sherrilyn Rist MD [facility] 06/18/2021, 11:49 AM  CC: Loel Dubonnet, NP

## 2021-06-18 NOTE — Telephone Encounter (Signed)
Am currently communicating with patient via My Chart. She sent a message there as well.

## 2021-06-18 NOTE — Patient Instructions (Signed)
Continue to work on quitting smoking  You do have spots in the lungs and emphysema which continuing to smoke will make worse  Low-dose CT scan of the chest in 1 year  Pulmonary function test at next visit  Prescription for albuterol and Spiriva sent to pharmacy for you  Follow-up regarding your sleep apnea  Follow-up with rheumatology  I will see you 6 months from now

## 2021-06-18 NOTE — Telephone Encounter (Signed)
Pt called requesting a new referral be sent to rheumatologist. Pt state they denied referral due to dr. Ophelia CharterYates stating its a 2nd opinion. Please resend referral. Please call pt at [phone].

## 2021-06-23 ENCOUNTER — Other Ambulatory Visit (HOSPITAL_COMMUNITY): Payer: Self-pay

## 2021-06-23 ENCOUNTER — Telehealth: Payer: Self-pay

## 2021-06-23 NOTE — Telephone Encounter (Signed)
Patient Advocate Encounter   Received notification that prior authorization for Spiriva Respimat 1.25MCG/ACT aerosol is required.   PA submitted on 06/23/2021 Key [id number] Status is pending

## 2021-06-28 ENCOUNTER — Other Ambulatory Visit (HOSPITAL_COMMUNITY): Payer: Self-pay

## 2021-06-28 NOTE — Telephone Encounter (Signed)
Received a fax regarding Prior Authorization from [facility] [employer] for [facility] RESPIMAT. Authorization has been DENIED because step therapy is required. ONE alternative must be tried/failed before they will cover.  Step therapy requirements are as follows: Advair (generic) $25.80 Breo $95.99 Dulera $83.20 Symbicort $94.80

## 2021-06-29 ENCOUNTER — Ambulatory Visit: Payer: 59

## 2021-06-29 NOTE — Telephone Encounter (Signed)
Please advise on medication change

## 2021-06-30 ENCOUNTER — Other Ambulatory Visit: Payer: Self-pay | Admitting: Pulmonary Disease

## 2021-06-30 MED ORDER — FLUTICASONE-SALMETEROL 250-50 MCG/ACT IN AEPB: 1.0000 | INHALATION_SPRAY | Freq: Two times a day (BID) | RESPIRATORY_TRACT | 11 refills | Status: DC

## 2021-06-30 NOTE — Telephone Encounter (Signed)
Okay to try generic Advair

## 2021-06-30 NOTE — Telephone Encounter (Signed)
I called the patient and let her know that per her insurance Advair is covered and it was sent to the pharmacy. She did not have any questions.

## 2021-07-02 ENCOUNTER — Encounter: Payer: 59 | Admitting: Nurse Practitioner

## 2021-07-02 DIAGNOSIS — Z0289 Encounter for other administrative examinations: Secondary | ICD-10-CM

## 2021-07-08 ENCOUNTER — Other Ambulatory Visit: Payer: Self-pay

## 2021-07-08 DIAGNOSIS — M5416 Radiculopathy, lumbar region: Secondary | ICD-10-CM

## 2021-07-08 DIAGNOSIS — M329 Systemic lupus erythematosus, unspecified: Secondary | ICD-10-CM

## 2021-07-23 ENCOUNTER — Ambulatory Visit (INDEPENDENT_AMBULATORY_CARE_PROVIDER_SITE_OTHER): Payer: 59 | Admitting: Family Medicine

## 2021-07-23 ENCOUNTER — Encounter: Payer: Self-pay | Admitting: Family Medicine

## 2021-07-23 VITALS — BP 148/100 | HR 101 | Temp 98.1°F | Resp 16 | Wt 179.0 lb

## 2021-07-23 DIAGNOSIS — N951 Menopausal and female climacteric states: Secondary | ICD-10-CM

## 2021-07-23 DIAGNOSIS — M069 Rheumatoid arthritis, unspecified: Secondary | ICD-10-CM

## 2021-07-23 DIAGNOSIS — I10 Essential (primary) hypertension: Secondary | ICD-10-CM

## 2021-07-23 DIAGNOSIS — E663 Overweight: Secondary | ICD-10-CM | POA: Diagnosis not present

## 2021-07-23 DIAGNOSIS — G4733 Obstructive sleep apnea (adult) (pediatric): Secondary | ICD-10-CM

## 2021-07-23 MED ORDER — FLUOXETINE HCL 20 MG PO TABS: 20.0000 mg | ORAL_TABLET | Freq: Every day | ORAL | 2 refills | Status: DC

## 2021-07-23 MED ORDER — PHENTERMINE HCL 37.5 MG PO CAPS: 37.5000 mg | ORAL_CAPSULE | ORAL | 0 refills | Status: DC

## 2021-07-23 NOTE — Progress Notes (Signed)
Patient came in today with c/o HTN and weight loss management concerns .

## 2021-07-24 ENCOUNTER — Encounter: Payer: Self-pay | Admitting: Family Medicine

## 2021-07-24 NOTE — Progress Notes (Signed)
Established Patient Office Visit  Subjective    Patient ID: Denise Carter, female    DOB: 1970-11-17  Age: 51 y.o. MRN: [id number]  CC:  Chief Complaint  Patient presents with   Weight Loss   Hypertension    HPI Denise Carter presents for follow up of chronic med issues.    Outpatient Encounter Medications as of 07/23/2021  Medication Sig   FLUoxetine (PROZAC) 20 MG tablet Take 1 tablet (20 mg total) by mouth daily.   fluticasone-salmeterol (ADVAIR) 250-50 MCG/ACT AEPB Inhale 1 puff into the lungs every 12 (twelve) hours.   phentermine 37.5 MG capsule Take 1 capsule (37.5 mg total) by mouth every morning.   albuterol (VENTOLIN HFA) 108 (90 Base) MCG/ACT inhaler Inhale 2 puffs into the lungs every 6 (six) hours as needed for wheezing or shortness of breath.   amLODipine (NORVASC) 5 MG tablet Take 1 tablet (5 mg total) by mouth daily.   aspirin (EC-81 ASPIRIN) 81 MG EC tablet Take 1 tablet (81 mg total) by mouth daily. Swallow whole.   atorvastatin (LIPITOR) 40 MG tablet Take 1 tablet (40 mg total) by mouth daily.   carvedilol (COREG) 25 MG tablet Take 1 tablet (25 mg total) by mouth 2 (two) times daily.   chlorthalidone (HYGROTON) 25 MG tablet Take 1 tablet (25 mg total) by mouth daily.   fluticasone (FLONASE) 50 MCG/ACT nasal spray Place 2 sprays into both nostrils daily.   hydroxychloroquine (PLAQUENIL) 200 MG tablet Take 1 tablet by mouth 2 (two) times a day.   ibuprofen (ADVIL) 200 MG tablet Take 200 mg by mouth every 6 (six) hours as needed.   irbesartan (AVAPRO) 300 MG tablet Take 1 tablet (300 mg total) by mouth daily.   pregabalin (LYRICA) 200 MG capsule Take 1 capsule (200 mg total) by mouth 2 (two) times daily.   RINVOQ 15 MG TB24 Take 1 tablet by mouth daily.   spironolactone (ALDACTONE) 100 MG tablet Take 2 tablets (200 mg total) by mouth daily.   tamsulosin (FLOMAX) 0.4 MG CAPS capsule Take 1 capsule (0.4 mg total) by mouth daily.   No facility-administered  encounter medications on file as of 07/23/2021.    Past Medical History:  Diagnosis Date   Atypical chest pain 05/23/2019   Essential hypertension 05/23/2019   Hypertension    Lupus (HCC)    OSA (obstructive sleep apnea) 11/19/2019   Shortness of breath 05/23/2019   Snoring 05/23/2019   Thyroid disease    Varicose veins of left lower extremity 01/26/2020    Past Surgical History:  Procedure Laterality Date   ABDOMINAL HYSTERECTOMY      Family History  Problem Relation Age of Onset   Hypertension Mother    Hypertension Maternal Grandmother    Lupus Maternal Grandmother    Heart failure Maternal Grandmother    Stroke Maternal Grandmother    CAD Maternal Grandmother    Adrenal disorder Neg Hx     Social History   Socioeconomic History   Marital status: Married    Spouse name: Not on file   Number of children: Not on file   Years of education: Not on file   Highest education level: Not on file  Occupational History   Not on file  Tobacco Use   Smoking status: Every Day    Packs/day: 1.00    Types: Cigarettes    Start date: 01/26/1991   Smokeless tobacco: Never   Tobacco comments:    Patient is participating  in health coaching for smoking cessation.   Vaping Use   Vaping Use: Never used  Substance and Sexual Activity   Alcohol use: No   Drug use: No   Sexual activity: Not on file  Other Topics Concern   Not on file  Social History Narrative   Right handed      Highest level of edu- 12th grade      7 children      Apartment- 2nd floor   Social Determinants of Health   Financial Resource Strain: High Risk (11/28/2019)   Overall Financial Resource Strain (CARDIA)    Difficulty of Paying Living Expenses: Very hard  Food Insecurity: No Food Insecurity (11/28/2019)   Hunger Vital Sign    Worried About Running Out of Food in the Last Year: Never true    Ran Out of Food in the Last Year: Never true  Transportation Needs: No Transportation Needs (11/28/2019)    PRAPARE - Administrator, Civil Service (Medical): No    Lack of Transportation (Non-Medical): No  Physical Activity: Inactive (05/23/2019)   Exercise Vital Sign    Days of Exercise per Week: 0 days    Minutes of Exercise per Session: 0 min  Stress: Stress Concern Present (03/05/2020)   [employer] of Occupational Health - Occupational Stress Questionnaire    Feeling of Stress : Rather much  Social Connections: Not on file  Intimate Partner Violence: Not on file    Review of Systems  All other systems reviewed and are negative.       Objective    BP (!) 148/100   Pulse (!) 101   Temp 98.1 F (36.7 C) (Oral)   Resp 16   Wt 179 lb (81.2 kg)   SpO2 97%   BMI 30.73 kg/m   Physical Exam Vitals and nursing note reviewed.  Constitutional:      General: She is not in acute distress. Cardiovascular:     Rate and Rhythm: Normal rate and regular rhythm.  Pulmonary:     Effort: Pulmonary effort is normal.     Breath sounds: Normal breath sounds.  Abdominal:     Palpations: Abdomen is soft.     Tenderness: There is no abdominal tenderness.  Musculoskeletal:     Right lower leg: No edema.     Left lower leg: No edema.  Neurological:     General: No focal deficit present.     Mental Status: She is alert and oriented to person, place, and time.         Assessment & Plan:  1. Uncontrolled hypertension Discussed compliance. Continue present management and monitor  2. Rheumatoid arthritis, involving unspecified site, unspecified whether rheumatoid factor present [facility]) Management as per consultant  3. Overweight Phentermine prescribed.   4. OSA (obstructive sleep apnea) Management as per consultant.   5. Perimenopausal Prozac prescribed.  Will monitor    Return in about 4 weeks (around 08/20/2021) for follow up.   Tommie Raymond, MD

## 2021-08-10 ENCOUNTER — Other Ambulatory Visit: Payer: Self-pay | Admitting: *Deleted

## 2021-08-10 DIAGNOSIS — M329 Systemic lupus erythematosus, unspecified: Secondary | ICD-10-CM

## 2021-08-14 ENCOUNTER — Ambulatory Visit (INDEPENDENT_AMBULATORY_CARE_PROVIDER_SITE_OTHER): Payer: 59 | Admitting: Pulmonary Disease

## 2021-08-14 DIAGNOSIS — R0609 Other forms of dyspnea: Secondary | ICD-10-CM

## 2021-08-14 DIAGNOSIS — J438 Other emphysema: Secondary | ICD-10-CM

## 2021-08-14 LAB — PULMONARY FUNCTION TEST
DL/VA % pred: 112 %
DL/VA: 4.79 ml/min/mmHg/L
DLCO cor % pred: 84 %
DLCO cor: 18.24 ml/min/mmHg
DLCO unc % pred: 84 %
DLCO unc: 18.24 ml/min/mmHg
FEF 25-75 Post: 2.78 L/sec
FEF 25-75 Pre: 1.79 L/sec
FEF2575-%Change-Post: 55 %
FEF2575-%Pred-Post: 101 %
FEF2575-%Pred-Pre: 65 %
FEV1-%Change-Post: 11 %
FEV1-%Pred-Post: 69 %
FEV1-%Pred-Pre: 62 %
FEV1-Post: 1.99 L
FEV1-Pre: 1.78 L
FEV1FVC-%Change-Post: 2 %
FEV1FVC-%Pred-Pre: 103 %
FEV6-%Change-Post: 9 %
FEV6-%Pred-Post: 67 %
FEV6-%Pred-Pre: 61 %
FEV6-Post: 2.36 L
FEV6-Pre: 2.16 L
FEV6FVC-%Pred-Post: 102 %
FEV6FVC-%Pred-Pre: 102 %
FVC-%Change-Post: 9 %
FVC-%Pred-Post: 65 %
FVC-%Pred-Pre: 59 %
FVC-Post: 2.36 L
FVC-Pre: 2.16 L
Post FEV1/FVC ratio: 84 %
Post FEV6/FVC ratio: 100 %
Pre FEV1/FVC ratio: 82 %
Pre FEV6/FVC Ratio: 100 %
RV % pred: 122 %
RV: 2.3 L
TLC % pred: 89 %
TLC: 4.68 L

## 2021-08-14 NOTE — Progress Notes (Signed)
Full PFT Performed Today  

## 2021-08-14 NOTE — Patient Instructions (Signed)
Full PFT Performed Today  

## 2021-08-17 ENCOUNTER — Encounter: Payer: Self-pay | Admitting: *Deleted

## 2021-08-20 ENCOUNTER — Other Ambulatory Visit: Payer: Self-pay | Admitting: *Deleted

## 2021-08-20 DIAGNOSIS — M329 Systemic lupus erythematosus, unspecified: Secondary | ICD-10-CM

## 2021-08-28 ENCOUNTER — Telehealth: Payer: Self-pay

## 2021-08-28 DIAGNOSIS — Z0000 Encounter for general adult medical examination without abnormal findings: Secondary | ICD-10-CM

## 2021-08-28 NOTE — Telephone Encounter (Signed)
Called patient to remind her to return the Vivify cuff to Dr. Duke Salvia during her next visit on 8/8 at 9:00am. Left patient a message with this reminder and contact information if she had questions.   Mccade Sullenberger Nedra Hai, [facility] Guide, Health Coach [address] [place] [address] Telephone: [phone] Email: [email or web address]

## 2021-09-01 ENCOUNTER — Ambulatory Visit (HOSPITAL_BASED_OUTPATIENT_CLINIC_OR_DEPARTMENT_OTHER): Payer: 59 | Admitting: Cardiovascular Disease

## 2021-09-03 ENCOUNTER — Other Ambulatory Visit: Payer: Self-pay | Admitting: Family Medicine

## 2021-09-04 NOTE — Telephone Encounter (Signed)
Requested medications are due for refill today.  yes  Requested medications are on the active medications list.  yes  Last refill. 02/06/2021 #60 5 refills  Future visit scheduled.   yes  Notes to clinic.  Refill not delegated.    Requested Prescriptions  Pending Prescriptions Disp Refills   pregabalin (LYRICA) 200 MG capsule [Pharmacy Med Name: Pregabalin Oral Capsule 200 MG] 60 capsule 0    Sig: TAKE ONE CAPSULE BY MOUTH TWICE DAILY     Not Delegated - Neurology:  Anticonvulsants - Controlled - pregabalin Failed - 09/03/2021  5:59 PM      Failed - This refill cannot be delegated      Passed - Cr in normal range and within 360 days    Creatinine, Ser  Date Value Ref Range Status  03/04/2021 0.85 0.57 - 1.00 mg/dL Final         Passed - Completed PHQ-2 or PHQ-9 in the last 360 days      Passed - Valid encounter within last 12 months    Recent Outpatient Visits           1 month ago Uncontrolled hypertension   Primary Care at [facility], MD   7 months ago Uncontrolled hypertension   Primary Care at [facility], MD   1 year ago Environmental and seasonal allergies   Primary Care at [facility], Kandee Keen, MD   1 year ago Annual physical exam   Primary Care at [facility], Kandee Keen, MD   2 years ago Dizziness   Primary Care at [facility], Kandee Keen, MD       Future Appointments             In 1 week Tomma Lightning, MD [facility]   In 3 weeks Georganna Skeans, MD Primary Care at [facility]

## 2021-09-16 ENCOUNTER — Ambulatory Visit (INDEPENDENT_AMBULATORY_CARE_PROVIDER_SITE_OTHER): Payer: 59 | Admitting: Pulmonary Disease

## 2021-09-16 ENCOUNTER — Encounter: Payer: Self-pay | Admitting: Pulmonary Disease

## 2021-09-16 VITALS — BP 122/82 | HR 94 | Ht 64.0 in | Wt 167.6 lb

## 2021-09-16 DIAGNOSIS — R0602 Shortness of breath: Secondary | ICD-10-CM | POA: Diagnosis not present

## 2021-09-16 NOTE — Patient Instructions (Signed)
I will see you back in about 6 months  Continue Advair and albuterol as needed Continue to work on quitting smoking, try and cut down the cigarette a week  Call with significant concerns  Your sleep study shows you have mild sleep apnea, will not be an inspire candidate  Call us with significant concerns

## 2021-09-16 NOTE — Progress Notes (Signed)
Denise Carter    [id number]    February 16, 1970  Primary Care Physician:Wilson, Lauris Poag, MD  Referring Physician: Georganna Skeans, MD [address] [place],  [place] [address]  Chief complaint:   Patient is being seen for shortness of breath  HPI:  Active smoker with shortness of breath She continues to smoke actively, failed Chantix in the past -Smokes about a pack a day  Underlying history of obstructive sleep apnea Sjogren's syndrome Chronic fatigue syndrome Polyneuropathy due to lupus History of rheumatoid arthritis Psoriatic arthritis Polyclonal gammopathy  Recent sleep study does reveal mild obstructive sleep apnea  No pertinent occupational history  Smokes about a pack of cigarettes a day  Recent CT with lung nodules and emphysema  Outpatient Encounter Medications as of 09/16/2021  Medication Sig   albuterol (VENTOLIN HFA) 108 (90 Base) MCG/ACT inhaler Inhale 2 puffs into the lungs every 6 (six) hours as needed for wheezing or shortness of breath.   amLODipine (NORVASC) 5 MG tablet Take 1 tablet (5 mg total) by mouth daily.   aspirin (EC-81 ASPIRIN) 81 MG EC tablet Take 1 tablet (81 mg total) by mouth daily. Swallow whole.   atorvastatin (LIPITOR) 40 MG tablet Take 1 tablet (40 mg total) by mouth daily.   carvedilol (COREG) 25 MG tablet Take 1 tablet (25 mg total) by mouth 2 (two) times daily.   chlorthalidone (HYGROTON) 25 MG tablet Take 1 tablet (25 mg total) by mouth daily.   FLUoxetine (PROZAC) 20 MG tablet Take 1 tablet (20 mg total) by mouth daily.   fluticasone (FLONASE) 50 MCG/ACT nasal spray Place 2 sprays into both nostrils daily.   fluticasone-salmeterol (ADVAIR) 250-50 MCG/ACT AEPB Inhale 1 puff into the lungs every 12 (twelve) hours.   hydroxychloroquine (PLAQUENIL) 200 MG tablet Take 1 tablet by mouth 2 (two) times a day.   ibuprofen (ADVIL) 200 MG tablet Take 200 mg by mouth every 6 (six) hours as needed.   irbesartan (AVAPRO)  300 MG tablet Take 1 tablet (300 mg total) by mouth daily.   phentermine 37.5 MG capsule Take 1 capsule (37.5 mg total) by mouth every morning.   pregabalin (LYRICA) 200 MG capsule Take 1 capsule (200 mg total) by mouth 2 (two) times daily.   spironolactone (ALDACTONE) 100 MG tablet Take 2 tablets (200 mg total) by mouth daily.   tamsulosin (FLOMAX) 0.4 MG CAPS capsule Take 1 capsule (0.4 mg total) by mouth daily.   [DISCONTINUED] RINVOQ 15 MG TB24 Take 1 tablet by mouth daily.   No facility-administered encounter medications on file as of 09/16/2021.    Allergies as of 09/16/2021 - Review Complete 09/16/2021  Allergen Reaction Noted   Labetalol Anxiety 10/25/2018    Past Medical History:  Diagnosis Date   Atypical chest pain 05/23/2019   Essential hypertension 05/23/2019   Hypertension    Lupus (HCC)    OSA (obstructive sleep apnea) 11/19/2019   Shortness of breath 05/23/2019   Snoring 05/23/2019   Thyroid disease    Varicose veins of left lower extremity 01/26/2020    Past Surgical History:  Procedure Laterality Date   ABDOMINAL HYSTERECTOMY      Family History  Problem Relation Age of Onset   Hypertension Mother    Hypertension Maternal Grandmother    Lupus Maternal Grandmother    Heart failure Maternal Grandmother    Stroke Maternal Grandmother    CAD Maternal Grandmother    Adrenal disorder Neg Hx  Social History   Socioeconomic History   Marital status: Married    Spouse name: Not on file   Number of children: Not on file   Years of education: Not on file   Highest education level: Not on file  Occupational History   Not on file  Tobacco Use   Smoking status: Every Day    Packs/day: 1.00    Types: Cigarettes    Start date: 01/26/1991   Smokeless tobacco: Never   Tobacco comments:    Patient is participating in health coaching for smoking cessation.   Vaping Use   Vaping Use: Never used  Substance and Sexual Activity   Alcohol use: No   Drug use: No    Sexual activity: Not on file  Other Topics Concern   Not on file  Social History Narrative   Right handed      Highest level of edu- 12th grade      7 children      Apartment- 2nd floor   Social Determinants of Health   Financial Resource Strain: High Risk (11/28/2019)   Overall Financial Resource Strain (CARDIA)    Difficulty of Paying Living Expenses: Very hard  Food Insecurity: No Food Insecurity (11/28/2019)   Hunger Vital Sign    Worried About Running Out of Food in the Last Year: Never true    Ran Out of Food in the Last Year: Never true  Transportation Needs: No Transportation Needs (11/28/2019)   PRAPARE - Administrator, Civil Service (Medical): No    Lack of Transportation (Non-Medical): No  Physical Activity: Inactive (05/23/2019)   Exercise Vital Sign    Days of Exercise per Week: 0 days    Minutes of Exercise per Session: 0 min  Stress: Stress Concern Present (03/05/2020)   [employer] of Occupational Health - Occupational Stress Questionnaire    Feeling of Stress : Rather much  Social Connections: Not on file  Intimate Partner Violence: Not on file    Review of Systems  Constitutional:  Positive for fatigue.  Respiratory:  Positive for shortness of breath.   Psychiatric/Behavioral:  Positive for sleep disturbance.     Vitals:   09/16/21 1423  BP: 122/82  Pulse: 94  SpO2: 100%     Physical Exam Constitutional:      Appearance: She is obese.  HENT:     Head: Normocephalic.     Mouth/Throat:     Mouth: Mucous membranes are moist.  Cardiovascular:     Rate and Rhythm: Normal rate and regular rhythm.     Heart sounds: No murmur heard.    No friction rub.  Pulmonary:     Effort: No respiratory distress.     Breath sounds: No stridor. No wheezing or rhonchi.  Musculoskeletal:     Cervical back: No rigidity or tenderness.  Neurological:     Mental Status: She is alert.  Psychiatric:        Mood and Affect: Mood normal.   Data  Reviewed: CT with 3 mm lung nodule, emphysema 05/22/2021-reviewed by myself  Most recent sleep study with mild obstructive sleep apnea  PFT reviewed showing possible mild obstructive disease with no significant bronchodilator response  Assessment:  Active smoker  Lung nodule  Emphysema  History of lupus, rheumatoid arthritis-not on any medications at present  Currently on Trelegy, feels Trelegy not really doing much for her  She does use albuterol as needed maybe every other day  Reason for continuing shortness  of breath was reviewed  PFT  Plan/Recommendations:  Smoking cessation counseling provided  Smoking cessation counseling  Spiriva was prescribed but not affordable, currently on Advair  Prescription for Breo  will be sent into pharmacy  Continue to try and quit smoking  Switch inhalers from Trelegy to [facility]  I will follow-up with her in about 6 months  Virl Diamond MD [facility] 09/16/2021, 2:28 PM  CC: Georganna Skeans, MD

## 2021-09-24 ENCOUNTER — Encounter: Payer: Self-pay | Admitting: Family Medicine

## 2021-09-24 ENCOUNTER — Other Ambulatory Visit: Payer: Self-pay | Admitting: Family Medicine

## 2021-09-29 ENCOUNTER — Telehealth: Payer: Self-pay | Admitting: Family Medicine

## 2021-09-29 ENCOUNTER — Encounter: Payer: Self-pay | Admitting: Family Medicine

## 2021-09-29 ENCOUNTER — Ambulatory Visit (INDEPENDENT_AMBULATORY_CARE_PROVIDER_SITE_OTHER): Payer: 59 | Admitting: Family Medicine

## 2021-09-29 VITALS — BP 157/125 | HR 104 | Temp 97.7°F | Resp 16 | Wt 167.6 lb

## 2021-09-29 DIAGNOSIS — M069 Rheumatoid arthritis, unspecified: Secondary | ICD-10-CM

## 2021-09-29 DIAGNOSIS — Z23 Encounter for immunization: Secondary | ICD-10-CM | POA: Diagnosis not present

## 2021-09-29 DIAGNOSIS — I10 Essential (primary) hypertension: Secondary | ICD-10-CM

## 2021-09-29 DIAGNOSIS — K219 Gastro-esophageal reflux disease without esophagitis: Secondary | ICD-10-CM | POA: Diagnosis not present

## 2021-09-29 MED ORDER — OMEPRAZOLE 20 MG PO CPDR: 20.0000 mg | DELAYED_RELEASE_CAPSULE | Freq: Every day | ORAL | 3 refills | Status: DC

## 2021-09-29 MED ORDER — PREGABALIN 200 MG PO CAPS: 200.0000 mg | ORAL_CAPSULE | Freq: Two times a day (BID) | ORAL | 0 refills | Status: DC

## 2021-09-29 NOTE — Telephone Encounter (Unsigned)
Copied from CRM [id number]. Topic: General - Other >> Sep 29, 2021  3:02 PM Everette C wrote: Reason for CRM: [place] with [employer] has called to ask if the patient's prescription for omeprazole (PRILOSEC) 20 MG capsule [[id number]] may be altered for insurance coverage   The patient needs to be prescribed Fransisco Hertz brand Omeprazole tablets that come in packages of 14  The patient needs 84 tablets in order to receive a 3 month supply   Please contact the patient's pharmacy further if needed

## 2021-09-29 NOTE — Telephone Encounter (Unsigned)
Copied from CRM [id number]. Topic: General - Other >> Sep 29, 2021  2:57 PM Everette C wrote: Reason for CRM: [place] with [employer] has called to ask if the patient's prescription for omeprazole (PRILOSEC) 20 MG capsule [[id number]] may be altered for insurance coverage   The patient needs to be prescribed Fransisco Hertz brand Omeprazole tablets that come in packages of 14  The patient needs 84 tablets in order to receive a 3 month supply   Please contact the patient's pharmacy further if needed

## 2021-09-30 ENCOUNTER — Encounter: Payer: Self-pay | Admitting: Family Medicine

## 2021-09-30 ENCOUNTER — Other Ambulatory Visit: Payer: Self-pay | Admitting: Family Medicine

## 2021-09-30 MED ORDER — OMEPRAZOLE 20 MG PO CPDR: 20.0000 mg | DELAYED_RELEASE_CAPSULE | Freq: Every day | ORAL | 1 refills | Status: DC

## 2021-09-30 NOTE — Progress Notes (Signed)
Established Patient Office Visit  Subjective    Patient ID: Denise Carter, female    DOB: 1970-08-17  Age: 51 y.o. MRN: [id number]  CC:  Chief Complaint  Patient presents with   Follow-up    HPI KONNI KESINGER presents for routine follow up of chronic med issues. Patient reports that she is not consistent with her blood pressure meds. She also reports difficulty getting a rheumatologist and feels that nothing that hs been tried so far has worked to resolve her symptoms.    Outpatient Encounter Medications as of 09/29/2021  Medication Sig   albuterol (VENTOLIN HFA) 108 (90 Base) MCG/ACT inhaler Inhale 2 puffs into the lungs every 6 (six) hours as needed for wheezing or shortness of breath.   amLODipine (NORVASC) 5 MG tablet Take 1 tablet (5 mg total) by mouth daily.   aspirin (EC-81 ASPIRIN) 81 MG EC tablet Take 1 tablet (81 mg total) by mouth daily. Swallow whole.   atorvastatin (LIPITOR) 40 MG tablet Take 1 tablet (40 mg total) by mouth daily.   carvedilol (COREG) 25 MG tablet Take 1 tablet (25 mg total) by mouth 2 (two) times daily.   chlorthalidone (HYGROTON) 25 MG tablet Take 1 tablet (25 mg total) by mouth daily.   FLUoxetine (PROZAC) 20 MG tablet Take 1 tablet (20 mg total) by mouth daily.   fluticasone (FLONASE) 50 MCG/ACT nasal spray Place 2 sprays into both nostrils daily.   fluticasone-salmeterol (ADVAIR) 250-50 MCG/ACT AEPB Inhale 1 puff into the lungs every 12 (twelve) hours.   hydroxychloroquine (PLAQUENIL) 200 MG tablet Take 1 tablet by mouth 2 (two) times a day.   ibuprofen (ADVIL) 200 MG tablet Take 200 mg by mouth every 6 (six) hours as needed.   irbesartan (AVAPRO) 300 MG tablet Take 1 tablet (300 mg total) by mouth daily.   phentermine 37.5 MG capsule Take 1 capsule (37.5 mg total) by mouth every morning.   spironolactone (ALDACTONE) 100 MG tablet Take 2 tablets (200 mg total) by mouth daily.   tamsulosin (FLOMAX) 0.4 MG CAPS capsule Take 1 capsule (0.4 mg  total) by mouth daily.   [DISCONTINUED] pregabalin (LYRICA) 200 MG capsule Take 1 capsule (200 mg total) by mouth 2 (two) times daily.   omeprazole (PRILOSEC) 20 MG capsule Take 1 capsule (20 mg total) by mouth daily.   pregabalin (LYRICA) 200 MG capsule Take 1 capsule (200 mg total) by mouth 2 (two) times daily.   No facility-administered encounter medications on file as of 09/29/2021.    Past Medical History:  Diagnosis Date   Atypical chest pain 05/23/2019   Essential hypertension 05/23/2019   Hypertension    Lupus (HCC)    OSA (obstructive sleep apnea) 11/19/2019   Shortness of breath 05/23/2019   Snoring 05/23/2019   Thyroid disease    Varicose veins of left lower extremity 01/26/2020    Past Surgical History:  Procedure Laterality Date   ABDOMINAL HYSTERECTOMY      Family History  Problem Relation Age of Onset   Hypertension Mother    Hypertension Maternal Grandmother    Lupus Maternal Grandmother    Heart failure Maternal Grandmother    Stroke Maternal Grandmother    CAD Maternal Grandmother    Adrenal disorder Neg Hx     Social History   Socioeconomic History   Marital status: Married    Spouse name: Not on file   Number of children: Not on file   Years of education: Not on file  Highest education level: Not on file  Occupational History   Not on file  Tobacco Use   Smoking status: Every Day    Packs/day: 1.00    Types: Cigarettes    Start date: 01/26/1991   Smokeless tobacco: Never   Tobacco comments:    Patient is participating in health coaching for smoking cessation.   Vaping Use   Vaping Use: Never used  Substance and Sexual Activity   Alcohol use: No   Drug use: No   Sexual activity: Not on file  Other Topics Concern   Not on file  Social History Narrative   Right handed      Highest level of edu- 12th grade      7 children      Apartment- 2nd floor   Social Determinants of Health   Financial Resource Strain: High Risk (11/28/2019)    Overall Financial Resource Strain (CARDIA)    Difficulty of Paying Living Expenses: Very hard  Food Insecurity: No Food Insecurity (11/28/2019)   Hunger Vital Sign    Worried About Running Out of Food in the Last Year: Never true    Ran Out of Food in the Last Year: Never true  Transportation Needs: No Transportation Needs (11/28/2019)   PRAPARE - Administrator, Civil Service (Medical): No    Lack of Transportation (Non-Medical): No  Physical Activity: Inactive (05/23/2019)   Exercise Vital Sign    Days of Exercise per Week: 0 days    Minutes of Exercise per Session: 0 min  Stress: Stress Concern Present (03/05/2020)   [employer] of Occupational Health - Occupational Stress Questionnaire    Feeling of Stress : Rather much  Social Connections: Not on file  Intimate Partner Violence: Not on file    Review of Systems  All other systems reviewed and are negative.       Objective    BP (!) 157/125   Pulse (!) 104   Temp 97.7 F (36.5 C) (Oral)   Resp 16   Wt 167 lb 9.6 oz (76 kg)   SpO2 96%   BMI 28.77 kg/m   Physical Exam Vitals and nursing note reviewed.  Constitutional:      General: She is not in acute distress. Cardiovascular:     Rate and Rhythm: Normal rate and regular rhythm.  Pulmonary:     Effort: Pulmonary effort is normal.     Breath sounds: Normal breath sounds.  Abdominal:     Palpations: Abdomen is soft.     Tenderness: There is no abdominal tenderness.  Musculoskeletal:     Right lower leg: No edema.     Left lower leg: No edema.  Neurological:     General: No focal deficit present.     Mental Status: She is alert and oriented to person, place, and time.         Assessment & Plan:   1. Uncontrolled hypertension Discussed compliance. Will continue present management and monitor  2. Gastroesophageal reflux disease without esophagitis Discussed dietary and activity options. Omeprazole refilled.   3. Rheumatoid arthritis,  involving unspecified site, unspecified whether rheumatoid factor present (HCC) Lyrica refilled. Patient is looking for a new consultant to evaluate and manage.    4. Need for influenza vaccination  - Flu Vaccine QUAD 43mo+IM (Fluarix, Fluzone & Alfiuria Quad PF)  5. Need for shingles vaccine  - Varicella-zoster vaccine IM    Return in about 4 weeks (around 10/27/2021) for follow up.  Becky Sax, MD

## 2021-09-30 NOTE — Telephone Encounter (Signed)
Pharm request for patient

## 2021-10-13 NOTE — Telephone Encounter (Signed)
Please advise the patient.

## 2021-10-13 NOTE — Telephone Encounter (Signed)
Patient said she has appt on 03/23/2022 with [facility]  Provider verbally agree to fill until patient appt with rheumatologist

## 2021-10-15 ENCOUNTER — Ambulatory Visit (HOSPITAL_BASED_OUTPATIENT_CLINIC_OR_DEPARTMENT_OTHER): Payer: 59 | Admitting: Family

## 2021-10-26 ENCOUNTER — Other Ambulatory Visit: Payer: Self-pay | Admitting: Family Medicine

## 2021-11-02 ENCOUNTER — Other Ambulatory Visit (HOSPITAL_BASED_OUTPATIENT_CLINIC_OR_DEPARTMENT_OTHER): Payer: Self-pay | Admitting: Family Medicine

## 2021-11-02 DIAGNOSIS — Z1231 Encounter for screening mammogram for malignant neoplasm of breast: Secondary | ICD-10-CM

## 2021-11-03 ENCOUNTER — Encounter (HOSPITAL_BASED_OUTPATIENT_CLINIC_OR_DEPARTMENT_OTHER): Payer: Self-pay | Admitting: Radiology

## 2021-11-03 ENCOUNTER — Ambulatory Visit (HOSPITAL_BASED_OUTPATIENT_CLINIC_OR_DEPARTMENT_OTHER)
Admission: RE | Admit: 2021-11-03 | Discharge: 2021-11-03 | Disposition: A | Discharge status: Home / Self Care | Payer: 59 | Source: Ambulatory Visit | Attending: Family Medicine | Admitting: Family Medicine

## 2021-11-03 DIAGNOSIS — Z1231 Encounter for screening mammogram for malignant neoplasm of breast: Secondary | ICD-10-CM | POA: Insufficient documentation

## 2021-11-03 DIAGNOSIS — I2510 Atherosclerotic heart disease of native coronary artery without angina pectoris: Secondary | ICD-10-CM | POA: Insufficient documentation

## 2021-11-05 ENCOUNTER — Ambulatory Visit (HOSPITAL_BASED_OUTPATIENT_CLINIC_OR_DEPARTMENT_OTHER): Payer: 59 | Admitting: Family

## 2021-11-08 NOTE — Progress Notes (Unsigned)
Office Visit    Patient Name: Denise Carter Date of Encounter: 11/09/2021  PCP:  Dorna Mai, MD   [place]  Cardiologist:  Skeet Latch, MD  Advanced Practice Provider:  No care team member to display Electrophysiologist:  None      Chief Complaint    Denise Carter is a 51 y.o. female with a hx of RA, Sjorgren's, SLE, resistant hypertension, OSA, tobacco use, varicose veins, hyperlipidemia presents today for follow up of hypertension.  Past Medical History    Past Medical History:  Diagnosis Date   Atypical chest pain 05/23/2019   Essential hypertension 05/23/2019   Hypertension    Lupus (HCC)    OSA (obstructive sleep apnea) 11/19/2019   Shortness of breath 05/23/2019   Snoring 05/23/2019   Thyroid disease    Varicose veins of left lower extremity 01/26/2020   Past Surgical History:  Procedure Laterality Date   ABDOMINAL HYSTERECTOMY     Allergies  Allergies  Allergen Reactions   Labetalol Anxiety    Headaches Headaches   History of Present Illness    Denise Carter is a 51 y.o. female with a hx of RA, Sjorgren's, SLE, resistant hypertension, OSA, tobacco use, varicose veins, hyperlipidemia  last seen 03/04/21.  Hypertension initially diagnosed in 2015 at time of SLE diagnosis. BP never well controlled. Previously seen by Dr. Johnsie Cancel but has transitioned to Dr. Oval Linsey. Renal artery dopplers 2019 normal. Plasma catecholamines and metanephrines negative. Coronary CTA 05/2019 with normal coronary arteries. Echo 05/2019 normal LVEF 60-65%, gr2DD. Renin and aldosterone normal for hyperaldosteronism but concerning for Cushing syndrome. Referred to endocrinology who did not feel her labs were consistent with Cushing. Sleep study showed OSA recommended for CPAP. Multiple previous medication changes and intolerances followed by Dr. [facility] Linsey [facility] and pharmacy team.   She was seen by primary care 02/06/21  with BP 168/133 and antihypertensive medications resumed including Amlodipine 5mg  QD, Chlorthalidone 25mg  QD, Irbesartan 300mg  qD, Spironolactone 200mg  QD. Confirmed with pharmacy that she picked up 90 day supply on 02/06/21.   She was seen 03/04/20 then lost to follow up for 1 year due to loss of insurance. Was seen to re-establish care 03/04/21. Working in [employer] in [employer]. BP at home 150-160 with arm cuff though elevated in clinic. Referred to ENT for inspir as did not tolerate CPAP due to fear of tube strangling her.  Coreg added to regimen and echo ordered due to dyspnea. Echo 04/2021 LVEF 60-65%, no rWMA, mild LVH, gr1DD, mild AI. She was referred to pulmonology.   Seen 05/18/21 for hypertension follow up. BP not at goal, Carvedilol increased. Referred to pulmonology for dyspnea. She had upcoming visit with ENT to discuss Inspire device of OSA. CT lung cancer screening and 24h blood pressure monitor ordered but not performed.   She presents today for follow up independently. Checks her blood pressure at home in the evening with SBP 150-160s. She describes "tension" headaches where her ponytail is and drink a cup of coffee which improves it. Since transition of inhalers tells me her breathing is about the same both sitting still and with activity. Tells me pulmonology told her no to inspire device. She is not interested in CPAP. Takes all of her medications in the morning - taking Carvedilol only daily. She is only taking 100mg  of Spironolactone rather than 200mg  daily. She notes she does feel better when she takes Irbesartan. Takes her medicine around 0430-0500 before  leaving for work.   Previous intolerances: Labetalol - headaches HCTZ - headaches Valsartan - diarrhea Amlodipine causes headaches at 10 mg, but tolerates 5mg  Hydralazine - headaches  EKGs/Labs/Other Studies Reviewed:   The following studies were reviewed today:  Echo  05/15/21  1. Left ventricular ejection fraction, by  estimation, is 60 to 65%. Left  ventricular ejection fraction by 3D volume is 59 %. The left ventricle has  normal function. The left ventricle has no regional wall motion  abnormalities. There is mild concentric  left ventricular hypertrophy. Left ventricular diastolic parameters are  consistent with Grade I diastolic dysfunction (impaired relaxation).   2. Right ventricular systolic function is normal. The right ventricular  size is normal. There is normal pulmonary artery systolic pressure. The  estimated right ventricular systolic pressure is [id number] mmHg.   3. Left atrial size was moderately dilated.   4. The mitral valve is grossly normal. No evidence of mitral valve  regurgitation. The mean mitral valve gradient is 1.0 mmHg.   5. The aortic valve is tricuspid. Aortic valve regurgitation is mild. No  aortic stenosis is present.   6. The inferior vena cava is normal in size with greater than 50%  respiratory variability, suggesting right atrial pressure of 3 mmHg.   Comparison(s): No significant change from prior study. EF 60%.   Echo 06/13/19 1. Left ventricular ejection fraction, by estimation, is 60 to 65%. The  left ventricle has normal function. The left ventricle has no regional  wall motion abnormalities. There is mild concentric left ventricular  hypertrophy. Left ventricular diastolic  parameters are consistent with Grade II diastolic dysfunction  (pseudonormalization). Elevated left ventricular end-diastolic pressure.   2. Right ventricular systolic function is normal. The right ventricular  size is normal. There is normal pulmonary artery systolic pressure.   3. Left atrial size was mildly dilated.   4. The mitral valve is normal in structure. Trivial mitral valve  regurgitation. No evidence of mitral stenosis.   5. The aortic valve is tricuspid. Aortic valve regurgitation is not  visualized. No aortic stenosis is present.   6. Aortic dilatation noted. There is mild  dilatation of the ascending  aorta measuring 38 mm.   7. The inferior vena cava is normal in size with greater than 50%  respiratory variability, suggesting right atrial pressure of 3 mmHg.   EKG:  No EKG today  Recent Labs: 02/06/2021: ALT 7 03/04/2021: BUN 8; Creatinine, Ser 0.85; Potassium 4.7; Sodium 141  Recent Lipid Panel    Component Value Date/Time   CHOL 164 02/06/2021 1141   TRIG 88 02/06/2021 1141   HDL 53 02/06/2021 1141   CHOLHDL 3.1 02/06/2021 1141   LDLCALC 95 02/06/2021 1141    Home Medications   Current Meds  Medication Sig   albuterol (VENTOLIN HFA) 108 (90 Base) MCG/ACT inhaler Inhale 2 puffs into the lungs every 6 (six) hours as needed for wheezing or shortness of breath.   aspirin (EC-81 ASPIRIN) 81 MG EC tablet Take 1 tablet (81 mg total) by mouth daily. Swallow whole.   FLUoxetine (PROZAC) 20 MG tablet Take 1 tablet (20 mg total) by mouth daily.   fluticasone (FLONASE) 50 MCG/ACT nasal spray Place 2 sprays into both nostrils daily.   fluticasone-salmeterol (ADVAIR) 250-50 MCG/ACT AEPB Inhale 1 puff into the lungs every 12 (twelve) hours.   ibuprofen (ADVIL) 200 MG tablet Take 200 mg by mouth every 6 (six) hours as needed.   pregabalin (LYRICA) 200 MG capsule TAKE  ONE CAPSULE BY MOUTH TWICE DAILY   tamsulosin (FLOMAX) 0.4 MG CAPS capsule Take 1 capsule (0.4 mg total) by mouth daily.   [DISCONTINUED] amLODipine (NORVASC) 5 MG tablet Take 1 tablet (5 mg total) by mouth daily.   [DISCONTINUED] atorvastatin (LIPITOR) 40 MG tablet Take 1 tablet (40 mg total) by mouth daily.   [DISCONTINUED] carvedilol (COREG) 25 MG tablet Take 1 tablet (25 mg total) by mouth 2 (two) times daily.   [DISCONTINUED] chlorthalidone (HYGROTON) 25 MG tablet Take 1 tablet (25 mg total) by mouth daily.   [DISCONTINUED] irbesartan (AVAPRO) 300 MG tablet Take 1 tablet (300 mg total) by mouth daily.   [DISCONTINUED] omeprazole (PRILOSEC) 20 MG capsule Take 1 capsule (20 mg total) by mouth  daily.   [DISCONTINUED] phentermine 37.5 MG capsule Take 1 capsule (37.5 mg total) by mouth every morning.   [DISCONTINUED] spironolactone (ALDACTONE) 100 MG tablet Take 2 tablets (200 mg total) by mouth daily.    Review of Systems     All other systems reviewed and are otherwise negative except as noted above.  Physical Exam    VS:  BP (!) 156/98 (BP Location: Right Arm, Patient Position: Sitting)   Pulse 84   Ht 5\' 4"  (1.626 m)   Wt 175 lb 9.6 oz (79.7 kg)   BMI 30.14 kg/m  , BMI Body mass index is 30.14 kg/m.  Wt Readings from Last 3 Encounters:  11/09/21 175 lb 9.6 oz (79.7 kg)  09/29/21 167 lb 9.6 oz (76 kg)  09/16/21 167 lb 9.6 oz (76 kg)    GEN: Well nourished, overweight, well developed, in no acute distress. HEENT: normal. Neck: Supple, no JVD, carotid bruits, or masses. Cardiac: RRR, no murmurs, rubs, or gallops. No clubbing, cyanosis, edema.  Radials/PT 2+ and equal bilaterally.  Respiratory:  Respirations regular and unlabored, clear to auscultation bilaterally. GI: Soft, nontender, nondistended. MS: No deformity or atrophy. Skin: Warm and dry, no rash. Neuro:  Strength and sensation are intact. Psych: Normal affect.  Assessment & Plan    SOB -  Echo 04/2021 normal LVEF, r1DD, normal PASP, mild AI. Seen by pulmonology 09/16/21 switched from Trelegy to [facility], encouraged to stop smoking, follow up in 6 months.    OSA - Declines CPAP trial due to fear of being strangled. Will route to Dr. Radford Pax for input on whether she would be candidate for [facility] device.   Tobacco use - Reports Wellbutrin and gradual cessation have not worked. Previously declined referral to psychology, Dr. Elias Else, for assistance with cessation. Smoking 1 PPD. Though has been cutting back over last 2 weeks.  Smoking cessation encouraged. Recommend utilization of 1800QUITNOW. 04/2021 CT Lung RADS2 - repeat CT 04/2022.  Resistant HTN - Not at goal. Taking Coreg only once per day and taking only  100mg  of Spironolactone instead of 200mg  prescribed dose.   She is frustrated by number of medications - discussed familial element as well as untreated OSA contributory. Multiple prior medication intolerances detailed above.  Hesitant to utilize Imdur as she has had headache with multiple her medications.  Increase Spironolactone to 200mg  QD as prescribed. Take Carvedilol 25mg  BID as prescribed. BMP in 1-2 weeks.  Secondary hypertension workup: 12/2017 normal renal arteries 05/2020 normal thyroid function OSA - declines CPAP, upcoming appt June with ENT for consideration of Inspire device  HLD - Continue Atorvastatin. Management per PCP.  Disposition: Follow up in 4 months with Skeet Latch, MD or APP.  Signed, Loel Dubonnet, NP 11/09/2021,  [place]

## 2021-11-09 ENCOUNTER — Encounter (HOSPITAL_BASED_OUTPATIENT_CLINIC_OR_DEPARTMENT_OTHER): Payer: Self-pay | Admitting: Family

## 2021-11-09 ENCOUNTER — Ambulatory Visit (INDEPENDENT_AMBULATORY_CARE_PROVIDER_SITE_OTHER): Payer: 59 | Admitting: Family

## 2021-11-09 VITALS — BP 156/98 | HR 84 | Ht 64.0 in | Wt 175.6 lb

## 2021-11-09 DIAGNOSIS — G4733 Obstructive sleep apnea (adult) (pediatric): Secondary | ICD-10-CM | POA: Diagnosis not present

## 2021-11-09 DIAGNOSIS — I1A0 Resistant hypertension: Secondary | ICD-10-CM | POA: Diagnosis not present

## 2021-11-09 DIAGNOSIS — E782 Mixed hyperlipidemia: Secondary | ICD-10-CM

## 2021-11-09 MED ORDER — AMLODIPINE BESYLATE 5 MG PO TABS: 5.0000 mg | ORAL_TABLET | Freq: Every day | ORAL | 1 refills | Status: DC

## 2021-11-09 MED ORDER — CHLORTHALIDONE 25 MG PO TABS: 25.0000 mg | ORAL_TABLET | Freq: Every day | ORAL | 1 refills | Status: DC

## 2021-11-09 MED ORDER — IRBESARTAN 300 MG PO TABS: 300.0000 mg | ORAL_TABLET | Freq: Every day | ORAL | 1 refills | Status: DC

## 2021-11-09 MED ORDER — ATORVASTATIN CALCIUM 40 MG PO TABS: 40.0000 mg | ORAL_TABLET | Freq: Every day | ORAL | 1 refills | Status: DC

## 2021-11-09 MED ORDER — SPIRONOLACTONE 100 MG PO TABS: 200.0000 mg | ORAL_TABLET | Freq: Every day | ORAL | 1 refills | Status: DC

## 2021-11-09 MED ORDER — CARVEDILOL 25 MG PO TABS: 25.0000 mg | ORAL_TABLET | Freq: Two times a day (BID) | ORAL | 1 refills | Status: DC

## 2021-11-09 MED ORDER — OMEPRAZOLE 20 MG PO CPDR: 20.0000 mg | DELAYED_RELEASE_CAPSULE | Freq: Every day | ORAL | 1 refills | Status: DC

## 2021-11-09 NOTE — Patient Instructions (Signed)
Medication Instructions:  Your physician has recommended you make the following change in your medication:   Please take Carvedilol twice per day  Please take two of your Spironolactone together daily to get 200mg  daily dose  *If you need a refill on your cardiac medications before your next appointment, please call your pharmacy*   Lab Work: Your physician recommends that you return for lab work in 2 weeks for BMP  If you have labs (blood work) drawn today and your tests are completely normal, you will receive your results only by: [place] (if you have MyChart) OR A paper copy in the mail If you have any lab test that is abnormal or we need to change your treatment, we will call you to review the results.  Please return for Lab work. You may come to the...   [facility] (3rd floor) [address] [place], [place] [address]  Open: 8am-Noon and 1pm-4:30pm  Please ring the doorbell on the small table when you exit the elevator and the Lab Tech will come get you  [place] [facility] [address] [place], [place], [place] [address] Open: 8am-1pm, then 2pm-4:30pm   [place]- Please see attached locations sheet stapled to your lab work with address and hours.    Follow-Up: At [facility], you and your health needs are our priority.  As part of our continuing mission to provide you with exceptional heart care, we have created designated Provider Care Teams.  These Care Teams include your primary Cardiologist (physician) and Advanced Practice Providers (APPs -  Physician Assistants and Nurse Practitioners) who all work together to provide you with the care you need, when you need it.  We recommend signing up for the patient portal called "MyChart".  Sign up information is provided on this After Visit Summary.  MyChart is used to connect with patients for Virtual Visits (Telemedicine).  Patients are able to view lab/test  results, encounter notes, upcoming appointments, etc.  Non-urgent messages can be sent to your provider as well.   To learn more about what you can do with MyChart, go to [email or web address].    Your next appointment:   4 month(s)  The format for your next appointment:   In Person  Provider:   Skeet Latch, MD    Other Instructions  Loel Dubonnet, NP will reach out to our sleep team to ask if they think you will qualify for Inspire device for sleep apnea.   Heart Healthy Diet Recommendations: A low-salt diet is recommended. Meats should be grilled, baked, or boiled. Avoid fried foods. Focus on lean protein sources like fish or chicken with vegetables and fruits. The American Heart Association is a Microbiologist!  American Heart Association Diet and Lifeystyle Recommendations   Exercise recommendations: The American Heart Association recommends 150 minutes of moderate intensity exercise weekly. Try 30 minutes of moderate intensity exercise 4-5 times per week. This could include walking, jogging, or swimming.   Important Information About Sugar

## 2021-11-11 ENCOUNTER — Encounter (HOSPITAL_BASED_OUTPATIENT_CLINIC_OR_DEPARTMENT_OTHER): Payer: Self-pay

## 2021-11-11 NOTE — Addendum Note (Signed)
Addended by: Loel Dubonnet on: 11/11/2021 12:37 PM   Modules accepted: Orders

## 2021-11-13 ENCOUNTER — Telehealth: Payer: Self-pay | Admitting: *Deleted

## 2021-11-13 NOTE — Telephone Encounter (Signed)
Prior Authorization for NPSG sent to [facility] via Phone. No PA is required Reference # [id number] Lorane Gell.

## 2021-11-15 ENCOUNTER — Encounter: Payer: Self-pay | Admitting: Family Medicine

## 2021-11-17 ENCOUNTER — Telehealth (HOSPITAL_BASED_OUTPATIENT_CLINIC_OR_DEPARTMENT_OTHER): Payer: Self-pay

## 2021-11-17 MED ORDER — PANTOPRAZOLE SODIUM 20 MG PO TBEC: 20.0000 mg | DELAYED_RELEASE_TABLET | Freq: Every day | ORAL | 3 refills | Status: DC

## 2021-11-17 NOTE — Telephone Encounter (Addendum)
Prescriptions updated and patient notified.     ----- Message from Loel Dubonnet, NP sent at 11/17/2021  2:27 PM EDT ----- Replace with Pantoprazole (Protonix) 20mg  QD. Or she can buy Omeprazole OTC.

## 2021-12-03 ENCOUNTER — Ambulatory Visit (INDEPENDENT_AMBULATORY_CARE_PROVIDER_SITE_OTHER): Payer: 59

## 2021-12-03 DIAGNOSIS — Z23 Encounter for immunization: Secondary | ICD-10-CM

## 2021-12-03 NOTE — Progress Notes (Signed)
After obtaining informed consent, the immunization is given by Adreonna Yontz E Marc Leichter .  

## 2021-12-08 ENCOUNTER — Other Ambulatory Visit: Payer: Self-pay | Admitting: Family Medicine

## 2021-12-10 MED ORDER — PREGABALIN 200 MG PO CAPS: 200.0000 mg | ORAL_CAPSULE | Freq: Two times a day (BID) | ORAL | 0 refills | Status: DC

## 2021-12-20 ENCOUNTER — Encounter (HOSPITAL_BASED_OUTPATIENT_CLINIC_OR_DEPARTMENT_OTHER): Payer: 59 | Admitting: Cardiovascular Disease

## 2021-12-31 ENCOUNTER — Encounter: Payer: Self-pay | Admitting: Family Medicine

## 2022-01-05 ENCOUNTER — Other Ambulatory Visit: Payer: Self-pay | Admitting: Family Medicine

## 2022-01-05 MED ORDER — PREGABALIN 200 MG PO CAPS: 200.0000 mg | ORAL_CAPSULE | Freq: Two times a day (BID) | ORAL | 0 refills | Status: DC

## 2022-01-08 ENCOUNTER — Other Ambulatory Visit: Payer: Self-pay | Admitting: Family Medicine

## 2022-01-21 ENCOUNTER — Telehealth: Payer: Self-pay | Admitting: Orthopaedic Surgery

## 2022-01-21 NOTE — Telephone Encounter (Signed)
Per Sue Lush, referral was not entered as Second Opinion, it was entered as Eval and Treat.  Dr. Corliss Skains and Dr. Dimple Casey both reviewed the records and declined the patient. Sue Lush recommended patient keep appointment with [facility] as her office is booking new patients in June. I called patient and advised. She states that she would be fine with [facility] appointment, however, her PCP was refilling Lyrica until rheumatology appointment, however, they are now declining to refill and she has been without her medication x a month. Per chart, Lyrica was refilled on 01/05/2022 and sent to [facility] Patient was unaware this was sent in. She is going to call [facility] and see if she can get her rx. She will keep appointment with [facility].

## 2022-01-21 NOTE — Telephone Encounter (Signed)
Patient states that she was told to see a Rheumatologist and she was told that the referral states it was for a second opinion, it was supposed to be for just treatment. Please Advise..[phone]

## 2022-01-21 NOTE — Telephone Encounter (Signed)
I spoke with patient. She states that she spoke with someone at Dr. Gregary Cromer office to find out why they denied her referral and it was because I had entered it as a second opinion and that it needs to be entered as eval and treat. Referral does not state second opinion, only that she had previously been treated for lupus in [place] (she lived there).  I have sent TEAMS message to Sue Lush and Ashok Cordia at Dr. Gregary Cromer office to see if they can offer me assistance in what I need to do to help patient.

## 2022-01-26 NOTE — Telephone Encounter (Unsigned)
Copied from [place] [id number]. Topic: Referral - Request for Referral >> Jan 21, 2022  8:43 AM Ludger Nutting wrote: Has patient seen PCP for this complaint? No. *If NO, is insurance requiring patient see PCP for this issue before PCP can refer them? Referral for which specialty: Neurology Preferred provider/office: [facility] Reason for referral: Patient was dropped from Dr. Serita Grit office

## 2022-01-28 ENCOUNTER — Other Ambulatory Visit: Payer: Self-pay | Admitting: Family Medicine

## 2022-01-28 MED ORDER — ALBUTEROL SULFATE HFA 108 (90 BASE) MCG/ACT IN AERS
2.0000 | INHALATION_SPRAY | Freq: Four times a day (QID) | RESPIRATORY_TRACT | 6 refills | Status: DC | PRN
Start: 2022-01-28 — End: 2023-02-16

## 2022-02-17 LAB — BASIC METABOLIC PANEL
BUN/Creatinine Ratio: 9 (ref 9–23)
BUN: 8 mg/dL (ref 6–24)
CO2: 24 mmol/L (ref 20–29)
Calcium: 9 mg/dL (ref 8.7–10.2)
Chloride: 106 mmol/L (ref 96–106)
Creatinine, Ser: 0.85 mg/dL (ref 0.57–1.00)
Glucose: 70 mg/dL (ref 70–99)
Potassium: 3.7 mmol/L (ref 3.5–5.2)
Sodium: 144 mmol/L (ref 134–144)
eGFR: 82 mL/min/{1.73_m2} (ref >59)

## 2022-02-18 ENCOUNTER — Ambulatory Visit: Payer: 59 | Admitting: Family Medicine

## 2022-03-08 ENCOUNTER — Ambulatory Visit (INDEPENDENT_AMBULATORY_CARE_PROVIDER_SITE_OTHER): Payer: 59 | Admitting: Pulmonary Disease

## 2022-03-08 ENCOUNTER — Encounter: Payer: Self-pay | Admitting: Pulmonary Disease

## 2022-03-08 VITALS — BP 134/82 | HR 89 | Ht 64.0 in | Wt 170.6 lb

## 2022-03-08 DIAGNOSIS — R0602 Shortness of breath: Secondary | ICD-10-CM

## 2022-03-08 NOTE — Patient Instructions (Signed)
I will see you back in about 6 months  Continue Advair, use it twice a day Make sure you rinse your mouth after use  Rescue albuterol as needed up to 4 times a day if needed  Graded exercise as tolerated  Make sure you are getting at least 6 hours of sleep every night  Call us with significant concerns

## 2022-03-08 NOTE — Progress Notes (Signed)
Denise Carter    EI:[id number]    [birth year]  Primary Care Physician:Wilson, Clyde Canterbury, MD  Referring Physician: Dorna Mai, [place],  [place] [address]  Chief complaint:   Patient is being seen for shortness of breath  HPI:  No significant exacerbation in symptoms since the last visit Still gets short of breath with activity Has been using Advair about once a day  Active smoker -About a pack a day  History of mild obstructive sleep apnea Sjogren's syndrome Chronic fatigue syndrome Polyneuropathy due to lupus History of rheumatoid arthritis Psoriatic arthritis Polyclonal gammopathy  She does have a cough and shortness of breath  Recent sleep study does reveal mild obstructive sleep apnea  No pertinent occupational history  Smokes about a pack of cigarettes a day  Recent CT with lung nodules and emphysema  Outpatient Encounter Medications as of 03/08/2022  Medication Sig   albuterol (VENTOLIN HFA) 108 (90 Base) MCG/ACT inhaler Inhale 2 puffs into the lungs every 6 (six) hours as needed for wheezing or shortness of breath.   amLODipine (NORVASC) 5 MG tablet Take 1 tablet (5 mg total) by mouth daily.   aspirin (EC-81 ASPIRIN) 81 MG EC tablet Take 1 tablet (81 mg total) by mouth daily. Swallow whole.   atorvastatin (LIPITOR) 40 MG tablet Take 1 tablet (40 mg total) by mouth daily.   carvedilol (COREG) 25 MG tablet Take 1 tablet (25 mg total) by mouth 2 (two) times daily.   chlorthalidone (HYGROTON) 25 MG tablet Take 1 tablet (25 mg total) by mouth daily.   fluticasone (FLONASE) 50 MCG/ACT nasal spray Place 2 sprays into both nostrils daily.   fluticasone-salmeterol (ADVAIR) 250-50 MCG/ACT AEPB Inhale 1 puff into the lungs every 12 (twelve) hours.   ibuprofen (ADVIL) 200 MG tablet Take 200 mg by mouth every 6 (six) hours as needed.   irbesartan (AVAPRO) 300 MG tablet Take 1 tablet (300 mg total) by mouth daily.   KLS OMPERAZOLE  20 MG TBEC Take 1 tablet by mouth daily.   pantoprazole (PROTONIX) 20 MG tablet Take 1 tablet (20 mg total) by mouth daily.   pregabalin (LYRICA) 200 MG capsule Take 1 capsule (200 mg total) by mouth 2 (two) times daily.   spironolactone (ALDACTONE) 100 MG tablet Take 2 tablets (200 mg total) by mouth daily.   tamsulosin (FLOMAX) 0.4 MG CAPS capsule Take 1 capsule (0.4 mg total) by mouth daily.   FLUoxetine (PROZAC) 20 MG tablet Take 1 tablet (20 mg total) by mouth daily. (Patient not taking: Reported on 03/08/2022)   No facility-administered encounter medications on file as of 03/08/2022.    Allergies as of 03/08/2022 - Review Complete 03/08/2022  Allergen Reaction Noted   Labetalol Anxiety 10/25/2018    Past Medical History:  Diagnosis Date   Atypical chest pain 05/23/2019   Essential hypertension 05/23/2019   Hypertension    Lupus (HCC)    OSA (obstructive sleep apnea) 11/19/2019   Shortness of breath 05/23/2019   Snoring 05/23/2019   Thyroid disease    Varicose veins of left lower extremity 01/26/2020    Past Surgical History:  Procedure Laterality Date   ABDOMINAL HYSTERECTOMY      Family History  Problem Relation Age of Onset   Hypertension Mother    Breast cancer Maternal Aunt 71 - 49   Hypertension Maternal Grandmother    Lupus Maternal Grandmother    Heart failure Maternal Grandmother  Stroke Maternal Grandmother    CAD Maternal Grandmother    Adrenal disorder Neg Hx     Social History   Socioeconomic History   Marital status: Married    Spouse name: Not on file   Number of children: Not on file   Years of education: Not on file   Highest education level: Not on file  Occupational History   Not on file  Tobacco Use   Smoking status: Every Day    Packs/day: 1.00    Types: Cigarettes    Start date: 01/26/1991   Smokeless tobacco: Never   Tobacco comments:    Patient is participating in health coaching for smoking cessation.   Vaping Use   Vaping Use:  Never used  Substance and Sexual Activity   Alcohol use: No   Drug use: No   Sexual activity: Not on file  Other Topics Concern   Not on file  Social History Narrative   Right handed      Highest level of edu- 12th grade      7 children      Apartment- 2nd floor   Social Determinants of Health   Financial Resource Strain: High Risk (11/28/2019)   Overall Financial Resource Strain (CARDIA)    Difficulty of Paying Living Expenses: Very hard  Food Insecurity: No Food Insecurity (11/28/2019)   Hunger Vital Sign    Worried About Running Out of Food in the Last Year: Never true    Ran Out of Food in the Last Year: Never true  Transportation Needs: No Transportation Needs (11/28/2019)   PRAPARE - Hydrologist (Medical): No    Lack of Transportation (Non-Medical): No  Physical Activity: Inactive (05/23/2019)   Exercise Vital Sign    Days of Exercise per Week: 0 days    Minutes of Exercise per Session: 0 min  Stress: Stress Concern Present (03/05/2020)   [place]    Feeling of Stress : Rather much  Social Connections: Not on file  Intimate Partner Violence: Not on file    Review of Systems  Constitutional:  Positive for fatigue.  Respiratory:  Positive for shortness of breath.   Psychiatric/Behavioral:  Positive for sleep disturbance.     Vitals:   03/08/22 1125  BP: 134/82  Pulse: 89  SpO2: 97%     Physical Exam Constitutional:      Appearance: She is obese.  HENT:     Head: Normocephalic.     Mouth/Throat:     Mouth: Mucous membranes are moist.  Eyes:     General: No scleral icterus.    Pupils: Pupils are equal, round, and reactive to light.  Cardiovascular:     Rate and Rhythm: Normal rate and regular rhythm.     Heart sounds: No murmur heard.    No friction rub.  Pulmonary:     Effort: No respiratory distress.     Breath sounds: No stridor. No wheezing or rhonchi.   Musculoskeletal:     Cervical back: No rigidity or tenderness.  Neurological:     Mental Status: She is alert.  Psychiatric:        Mood and Affect: Mood normal.    Data Reviewed: CT with 3 mm lung nodule, emphysema 05/22/2021-reviewed by myself  Most recent sleep study with mild obstructive sleep apnea  PFT reviewed showing possible mild obstructive disease with no significant bronchodilator response  Assessment:  Emphysema  Active  smoker  Lung nodule  History of lupus, rheumatoid arthritis  Currently on Advair, only using Advair once a day, encouraged to use it twice a day  Daytime sleepiness   Plan/Recommendations:  Smoking cessation  Continue Advair twice a day  Bronchodilators  Encouraged to get at least 6 hours of sleep at night  Follow-up in about 6 months  Encouraged to call with any significant concerns  Sherrilyn Rist MD [facility] 03/08/2022, 11:54 AM  CC: Dorna Mai, MD

## 2022-03-10 NOTE — Progress Notes (Signed)
Cardiology Office Note  Date:  03/12/2022   ID:  Denise Carter, Denise Carter December 29, 1970, MRN [id number]  PCP:  Dorna Mai, MD  Cardiologist:   Skeet Latch, MD   No chief complaint on file.    History of Present Illness: Denise Carter is a 52 y.o. female with hypertension, OSA on CPAP, SLE, rheumatoid arthritis, polyclonal gammopathy, Sjogren's disease, and tobacco abuse here for follow-up.  She first found out she had hypertension in 2015 when she was diagnosed with SLE.  Her BP had never been well-controlled.  She previously saw Dr. Johnsie Cancel 12/2017 for hypertension.  Her blood pressure at that time was around [id number] systolic.  She was referred for renal artery Dopplers that were normal.  Labetalol was increased to 300 mg twice daily.  She was seen in the ED 12/2018 with headache and sinus congestion.  Her blood pressure at that time was 192/131.  She worked at [employer] and walked a lot at work.  She had random episodes of chest pain and diaphoresis.  The pain was on her R side and under her R breast.  It started sharp and then subsided, sometimes associated with heart racing.  Occurred mostly at rest and regularly with exertion.  She did not complete formal exercise outside of work.  She started noticing shortness of breath when walking.  She continued to smoke 1 pack of cigarettes daily.  In the past she tried quitting with Chantix, gum, and she thinks she used Wellbutrin but was willing to try again.  There was concern for pheochromocytoma.  Plasma catecholamines and metanephrines were negative.  Denise Carter had been under a lot of stress because of her son.  He was still living in [place] and often into a lot of trouble with the law.  She was having some chest pain and was referred for coronary CT-A 05/2019 that revealed normal coronary arteries.  She had shortness of breath and had an echo that revealed LVEF 60 to 65% with grade 2 diastolic dysfunction.  Right atrial pressure was 3 mmHg.  Testing  we negative for hyperaldosteronism.  She was referred to endocrinology and they did not feel her labs were consistent with Cushing syndrome.  She was referred for sleep study and was found to have sleep apnea.  CPAP was recommended.  She underwent multiple medication changes and had been working with our pharmacist.  Valsartan was switched to irbesartan.  She was referred to PREP for exercise but unable to participate due to her work schedule.  She saw our pharmacist on 09/2019.  At that time her blood pressure was 170/112.  Spironolactone was increased to 50 mg.  However she did not understand this change and had only been taking 25 mg.  Renal function and potassium remained stable with this change.  She noted facial droop and was referred for CT of the head that was normal.  She was trying to cut back on smoking and reduce stress.  Her arthritis medication was changed and her symptoms haven't been well-controlled.  She noted that her BP goes up when she is stressed.  Her lifting at work aggravated her joints and they get hot and swollen at the end of the day.  She had a poor appetite and wasn't eating much.  She didn't have much salt in her diet.  She noted pain in her legs walking at work.   She has struggled to tolerate her CPAP and is interested in the [place] device. She had  an Echo 04/2021 with LVEF 60-65%, mild LVH, and grade 1 diastolic dysfunction. She requested to see pulmonary due to her shortness of breath. She continues to smoke about 1 PPD of cigarettes daily. CT showed lung nodules and emphysema, they recommended increasing her Advair to twice a day. She last saw [facility] 10/2021 and blood pressure remained uncontrolled. She was taking carvedilol once a day and 100 mg spironolactone rather than 200 mg. [name] recommended increasing her medications to the prescribed doses.  Today, she reports that her symptoms are ongoing and relatively unchanged. While just sitting she continues to have episodes of  dyspnea. Her inhalers were recently changed to twice daily, but this still doesn't seem to improve her symptoms. At this time she continues to smoke 1 ppd, but doesn't go over this amount even while stressed. Lately she has been highly stressed. She also still has afternoon headaches. Currently she is down to drinking 2 cups of coffee daily: one cup around 4 AM before work, and another cup around 2 PM which most of the time helps resolve her headache. Her blood pressure is constantly labile. It was 134/82 at a recent clinic visit; today her BP is 172/130 in office (176/102 on recheck). At home her readings have been similar. She is compliant with her medications. In the past year she complains of worsening urinary urgency, as well as urinary frequency throughout the day no matter when she takes her diuretic. For activity she is frequently bending, lifting, and walking while working at [employer]. If weather permits, she may walk to a store. Generally she feels very tired and still notices swelling in her knees which she thinks is arthritis. For about a year, she has not had much of an appetite. Usually she eats one meal a day. She was started on Prozac which has not helped much. She denies any palpitations, chest pain, lightheadedness, syncope, orthopnea, or PND.  Previous antihypertensives: Lisinopril HCTZ Verapamil Amlodipine - headache labetalolol- didn't work Atenolol worked some in [place]. Hydralazine - headaches  Past Medical History:  Diagnosis Date   Atypical chest pain 05/23/2019   Essential hypertension 05/23/2019   Hypertension    Lupus (HCC)    OSA (obstructive sleep apnea) 11/19/2019   Resistant hypertension 05/23/2019   Shortness of breath 05/23/2019   Snoring 05/23/2019   Thyroid disease    Varicose veins of left lower extremity 01/26/2020    Past Surgical History:  Procedure Laterality Date   ABDOMINAL HYSTERECTOMY       Current Outpatient Medications  Medication Sig Dispense  Refill   albuterol (VENTOLIN HFA) 108 (90 Base) MCG/ACT inhaler Inhale 2 puffs into the lungs every 6 (six) hours as needed for wheezing or shortness of breath. 8 g 6   amLODipine (NORVASC) 5 MG tablet Take 1 tablet (5 mg total) by mouth daily. 90 tablet 1   aspirin (EC-81 ASPIRIN) 81 MG EC tablet Take 1 tablet (81 mg total) by mouth daily. Swallow whole. 30 tablet 12   atorvastatin (LIPITOR) 40 MG tablet Take 1 tablet (40 mg total) by mouth daily. 90 tablet 1   carvedilol (COREG) 25 MG tablet Take 1 tablet (25 mg total) by mouth 2 (two) times daily. 180 tablet 1   chlorthalidone (HYGROTON) 25 MG tablet Take 1 tablet (25 mg total) by mouth daily. 90 tablet 1   cloNIDine (CATAPRES) 0.1 MG tablet Take 1 tablet (0.1 mg total) by mouth 2 (two) times daily. 60 tablet 11   fluticasone (FLONASE) 50 MCG/ACT  nasal spray Place 2 sprays into both nostrils daily. 91 g 1   fluticasone-salmeterol (ADVAIR) 250-50 MCG/ACT AEPB Inhale 1 puff into the lungs every 12 (twelve) hours. 60 each 11   ibuprofen (ADVIL) 200 MG tablet Take 200 mg by mouth every 6 (six) hours as needed.     irbesartan (AVAPRO) 300 MG tablet Take 1 tablet (300 mg total) by mouth daily. 90 tablet 1   KLS OMPERAZOLE 20 MG TBEC Take 1 tablet by mouth daily.     pantoprazole (PROTONIX) 20 MG tablet Take 1 tablet (20 mg total) by mouth daily. 90 tablet 3   pregabalin (LYRICA) 200 MG capsule Take 1 capsule (200 mg total) by mouth 2 (two) times daily. 60 capsule 0   spironolactone (ALDACTONE) 100 MG tablet Take 2 tablets (200 mg total) by mouth daily. 180 tablet 1   tamsulosin (FLOMAX) 0.4 MG CAPS capsule Take 1 capsule (0.4 mg total) by mouth daily. 15 capsule 0   FLUoxetine (PROZAC) 20 MG tablet Take 1 tablet (20 mg total) by mouth daily. 30 tablet 2   No current facility-administered medications for this visit.    Allergies:   Labetalol    Social History:  The patient  reports that she has been smoking cigarettes. She started smoking about  31 years ago. She has been smoking an average of 1 pack per day. She has never used smokeless tobacco. She reports that she does not drink alcohol and does not use drugs.   Family History:  The patient's family history includes Breast cancer (age of onset: 68 - 60) in her maternal aunt; CAD in her maternal grandmother; Heart failure in her maternal grandmother; Hypertension in her maternal grandmother and mother; Lupus in her maternal grandmother; Stroke in her maternal grandmother.    ROS:   Please see the history of present illness.    (+) Shortness of breath (+) Headaches (+) Loss of appetite (+) Urinary urgency, frequency (+) Stress (+) Fatigue (+) Bilateral knee edema All other systems are reviewed and negative.    PHYSICAL EXAM: VS:  BP (!) 176/102 (BP Location: Left Arm, Patient Position: Sitting, Cuff Size: Normal)   Pulse 85   Ht 5' 4"$  (1.626 m)   Wt 168 lb 8 oz (76.4 kg)   BMI 28.92 kg/m  , BMI Body mass index is 28.92 kg/m. GENERAL:  Well appearing HEENT: Pupils equal round and reactive, fundi not visualized, oral mucosa unremarkable NECK:  No jugular venous distention, waveform within normal limits, carotid upstroke brisk and symmetric, no bruits LUNGS:  Clear to auscultation bilaterally HEART:  RRR.  PMI not displaced or sustained,S1 and S2 within normal limits, no S3, no S4, no clicks, no rubs, no murmurs ABD:  Flat, positive bowel sounds normal in frequency in pitch, no bruits, no rebound, no guarding, no midline pulsatile mass, no hepatomegaly, no splenomegaly EXT:  1 plus pulses throughout, no edema, no cyanosis no clubbing SKIN:  No rashes no nodules NEURO:  Cranial nerves II through XII grossly intact, motor grossly intact throughout PSYCH:  Cognitively intact, oriented to person place and time  EKG:  EKG is personally reviewed. 03/12/2022:  Sinus rhythm. Rate 85 bpm. LAFB. LVH. 05/23/2019: sinus rhythm.  Rate 82 bpm.  Nonspecific ST changes.  LAFB.  Cannot rule  out anteroseptal infarct    CT Chest  05/22/2021: IMPRESSION: Lung-RADS 2, benign appearance or behavior. Continue annual screening with low-dose chest CT without contrast in 12 months.   Aortic Atherosclerosis (ICD10-I70.0)  and Emphysema (ICD10-J43.9).  Echo  05/15/2021:  1. Left ventricular ejection fraction, by estimation, is 60 to 65%. Left  ventricular ejection fraction by 3D volume is 59 %. The left ventricle has  normal function. The left ventricle has no regional wall motion  abnormalities. There is mild concentric  left ventricular hypertrophy. Left ventricular diastolic parameters are  consistent with Grade I diastolic dysfunction (impaired relaxation).   2. Right ventricular systolic function is normal. The right ventricular  size is normal. There is normal pulmonary artery systolic pressure. The  estimated right ventricular systolic pressure is [id number] mmHg.   3. Left atrial size was moderately dilated.   4. The mitral valve is grossly normal. No evidence of mitral valve  regurgitation. The mean mitral valve gradient is 1.0 mmHg.   5. The aortic valve is tricuspid. Aortic valve regurgitation is mild. No  aortic stenosis is present.   6. The inferior vena cava is normal in size with greater than 50%  respiratory variability, suggesting right atrial pressure of 3 mmHg.   Comparison(s): No significant change from prior study. EF 60%.   Echo 05/2019: 1. Left ventricular ejection fraction, by estimation, is 60 to 65%. The  left ventricle has normal function. The left ventricle has no regional  wall motion abnormalities. There is mild concentric left ventricular  hypertrophy. Left ventricular diastolic  parameters are consistent with Grade II diastolic dysfunction  (pseudonormalization). Elevated left ventricular end-diastolic pressure.   2. Right ventricular systolic function is normal. The right ventricular  size is normal. There is normal pulmonary artery systolic pressure.    3. Left atrial size was mildly dilated.   4. The mitral valve is normal in structure. Trivial mitral valve  regurgitation. No evidence of mitral stenosis.   5. The aortic valve is tricuspid. Aortic valve regurgitation is not  visualized. No aortic stenosis is present.   6. Aortic dilatation noted. There is mild dilatation of the ascending  aorta measuring 38 mm.   7. The inferior vena cava is normal in size with greater than 50%  respiratory variability, suggesting right atrial pressure of 3 mmHg.   Recent Labs: 02/16/2022: BUN 8; Creatinine, Ser 0.85; Potassium 3.7; Sodium 144    Lipid Panel    Component Value Date/Time   CHOL 164 02/06/2021 1141   TRIG 88 02/06/2021 1141   HDL 53 02/06/2021 1141   CHOLHDL 3.1 02/06/2021 1141   LDLCALC 95 02/06/2021 1141    Wt Readings from Last 3 Encounters:  03/12/22 168 lb 8 oz (76.4 kg)  03/08/22 170 lb 9.6 oz (77.4 kg)  11/09/21 175 lb 9.6 oz (79.7 kg)      ASSESSMENT AND PLAN:  Resistant hypertension Blood pressure remains very elevated.  She reports taking her medication as prescribed.  Encouraged her to increase her exercise.  She is active at work but no exercise outside of work.  Recommend smoking cessation.  Add clonidine 0.60m bid.  Continue amlodipine, carvedilol, chlorthalidone, irbesartan and spironoactone.  We will also refer her for renal denervation with Dr. AFletcher Anon    Shared Decision Making/Informed Consent The risks [stroke (1 in 1000), death (1 in 1000), kidney failure [usually temporary] (1 in 500), bleeding (1 in 200), allergic reaction [possibly serious] (1 in 200)], benefits (diagnostic support and management of coronary artery disease) and alternatives of a renal denervation were discussed in detail with Denise Carter she is willing to proceed.   OSA (obstructive sleep apnea) Repeat sleep study to see if  she is a candidate for Dawna Part is pending.  She cannot tolerate CPAP.  Smoking hx She continues to smoke  and doesn't think that she will be able to quit at this time.    Disposition:   FU with [facility] in 2-4 weeks.   Medication Adjustments/Labs and Tests Ordered: Current medicines are reviewed at length with the patient today.  Concerns regarding medicines are outlined above.   Orders Placed This Encounter  Procedures   EKG 12-Lead   Meds ordered this encounter  Medications   cloNIDine (CATAPRES) 0.1 MG tablet    Sig: Take 1 tablet (0.1 mg total) by mouth 2 (two) times daily.    Dispense:  60 tablet    Refill:  11   FLUoxetine (PROZAC) 20 MG tablet    Sig: Take 1 tablet (20 mg total) by mouth daily.    Dispense:  30 tablet    Refill:  2   Patient Instructions  Medication Instructions:  Your physician has recommended you make the following change in your medication:   We have added Prozac 79m daily back to your medication list   Start: Clonidine 0.140mtwice daily   *If you need a refill on your cardiac medications before your next appointment, please call your pharmacy*  Follow-Up: [facility] your [facility] needs are our priority.  As part of our continuing mission to provide you with exceptional heart care, we have created designated Provider Care Teams.  These Care Teams include your primary Cardiologist (physician) and Advanced Practice Providers (APPs -  Physician Assistants and Nurse Practitioners) who all work together to provide you with the care you need, when you need it.  We recommend signing up for the patient portal called "MyChart".  Sign up information is provided on this After Visit Summary.  MyChart is used to connect with patients for Virtual Visits (Telemedicine).  Patients are able to view lab/test results, encounter notes, upcoming appointments, etc.  Non-urgent messages can be sent to your provider as well.   To learn more about what you can do with MyChart, go to [email or web address]   Your next appointment:    March 20th at 3:15pm with Dr. RaOval Linsey Other Instructions We are referring you for Renal denervation! We will be in touch with next steps!    I,Mathew Stumpf,acting as a scEducation administratoror TiSkeet LatchMD.,have documented all relevant documentation on the behalf of TiSkeet LatchMD,as directed by  TiSkeet LatchMD while in the presence of TiSkeet LatchMD.  I, TiNunezaOval LinseyMD have reviewed all documentation for this visit.  The documentation of the exam, diagnosis, procedures, and orders on 03/12/2022 are all accurate and complete.   Signed, Jacque Byron C. RaOval LinseyMD, FASurgicare Surgical Associates Of [facility] LLC2/16/2024 11:03 AM    [place]

## 2022-03-12 ENCOUNTER — Encounter (HOSPITAL_BASED_OUTPATIENT_CLINIC_OR_DEPARTMENT_OTHER): Payer: Self-pay | Admitting: Cardiovascular Disease

## 2022-03-12 ENCOUNTER — Ambulatory Visit (INDEPENDENT_AMBULATORY_CARE_PROVIDER_SITE_OTHER): Payer: 59 | Admitting: Cardiovascular Disease

## 2022-03-12 VITALS — BP 176/102 | HR 85 | Ht 64.0 in | Wt 168.5 lb

## 2022-03-12 DIAGNOSIS — I1A0 Resistant hypertension: Secondary | ICD-10-CM | POA: Diagnosis not present

## 2022-03-12 DIAGNOSIS — Z87891 Personal history of nicotine dependence: Secondary | ICD-10-CM

## 2022-03-12 DIAGNOSIS — G4733 Obstructive sleep apnea (adult) (pediatric): Secondary | ICD-10-CM

## 2022-03-12 MED ORDER — FLUOXETINE HCL 20 MG PO TABS: 20.0000 mg | ORAL_TABLET | Freq: Every day | ORAL | 2 refills | Status: DC

## 2022-03-12 MED ORDER — CLONIDINE HCL 0.1 MG PO TABS: 0.1000 mg | ORAL_TABLET | Freq: Two times a day (BID) | ORAL | 11 refills | Status: DC

## 2022-03-12 NOTE — Assessment & Plan Note (Addendum)
Blood pressure remains very elevated.  She reports taking her medication as prescribed.  Encouraged her to increase her exercise.  She is active at work but no exercise outside of work.  Recommend smoking cessation.  Add clonidine 0.58m bid.  Continue amlodipine, carvedilol, chlorthalidone, irbesartan and spironoactone.  We will also refer her for renal denervation with Dr. AFletcher Anon    Shared Decision Making/Informed Consent The risks [stroke (1 in 1000), death (1 in 1000), kidney failure [usually temporary] (1 in 500), bleeding (1 in 200), allergic reaction [possibly serious] (1 in 200)], benefits (diagnostic support and management of coronary artery disease) and alternatives of a renal denervation were discussed in detail with Denise Carter she is willing to proceed.

## 2022-03-12 NOTE — Patient Instructions (Addendum)
Medication Instructions:  Your physician has recommended you make the following change in your medication:   We have added Prozac 46m daily back to your medication list   Start: Clonidine 0.197mtwice daily   *If you need a refill on your cardiac medications before your next appointment, please call your pharmacy*  Follow-Up: At [facility] and your health needs are our priority.  As part of our continuing mission to provide you with exceptional heart care, we have created designated Provider Care Teams.  These Care Teams include your primary Cardiologist (physician) and Advanced Practice Providers (APPs -  Physician Assistants and Nurse Practitioners) who all work together to provide you with the care you need, when you need it.  We recommend signing up for the patient portal called "MyChart".  Sign up information is provided on this After Visit Summary.  MyChart is used to connect with patients for Virtual Visits (Telemedicine).  Patients are able to view lab/test results, encounter notes, upcoming appointments, etc.  Non-urgent messages can be sent to your provider as well.   To learn more about what you can do with MyChart, go to [email or web address]   Your next appointment:   March 20th at 3:15pm with Dr. RaOval Linsey Other Instructions We are referring you for Renal denervation! We will be in touch with next steps!

## 2022-03-12 NOTE — Assessment & Plan Note (Signed)
She continues to smoke and doesn't think that she will be able to quit at this time.

## 2022-03-12 NOTE — Assessment & Plan Note (Signed)
Repeat sleep study to see if she is a candidate for Dawna Part is pending.  She cannot tolerate CPAP.

## 2022-04-14 ENCOUNTER — Telehealth: Payer: Self-pay | Admitting: Cardiovascular Disease

## 2022-04-14 ENCOUNTER — Ambulatory Visit (INDEPENDENT_AMBULATORY_CARE_PROVIDER_SITE_OTHER): Payer: 59 | Admitting: Cardiovascular Disease

## 2022-04-14 ENCOUNTER — Encounter (HOSPITAL_BASED_OUTPATIENT_CLINIC_OR_DEPARTMENT_OTHER): Payer: Self-pay | Admitting: Cardiovascular Disease

## 2022-04-14 VITALS — BP 198/114 | HR 88 | Ht 64.0 in | Wt 170.3 lb

## 2022-04-14 DIAGNOSIS — G4733 Obstructive sleep apnea (adult) (pediatric): Secondary | ICD-10-CM

## 2022-04-14 DIAGNOSIS — I1A0 Resistant hypertension: Secondary | ICD-10-CM | POA: Diagnosis not present

## 2022-04-14 MED ORDER — CLONIDINE HCL 0.1 MG PO TABS: 0.1000 mg | ORAL_TABLET | Freq: Three times a day (TID) | ORAL | 3 refills | Status: DC

## 2022-04-14 NOTE — Patient Instructions (Signed)
Medication Instructions:  INCREASE CLONIDINE TO THREE TIMES A DAY   Labwork: NONE  Testing/Procedures: NONE   Follow-Up: 07/19/2022 10:00 AM WITH DR Trinity   Any Other Special Instructions Will Be Listed Below (If Applicable).     If you need a refill on your cardiac medications before your next appointment, please call your pharmacy.

## 2022-04-14 NOTE — Telephone Encounter (Signed)
Returned call to patient to explain precert department will take care of getting procedure precerted with her insurance company and she would be notified of their determination and procedure would be scheduled.  Georgana Curio MHA RN CCM

## 2022-04-14 NOTE — Progress Notes (Signed)
Advanced Hypertension Clinic Follow-up:  Date:  04/14/2022   ID:  Denise Carter, DOB Mar 19, 1970, MRN [id number]  PCP:  Denise Mai, MD  Cardiologist:   Denise Latch, MD   No chief complaint on file.    History of Present Illness:  Denise Carter is a 52 y.o. female with hypertension, OSA on CPAP, SLE, rheumatoid arthritis, polyclonal gammopathy, Sjogren's disease, and tobacco abuse here for follow-up.  She first found out she had hypertension in 2015 when she was diagnosed with SLE.  Her BP had never been well-controlled.  She previously saw Denise Carter 12/2017 for hypertension.  Her blood pressure at that time was around [id number] systolic.  She was referred for renal artery Dopplers that were normal.  Labetalol was increased to 300 mg twice daily.  She was seen in the ED 12/2018 with headache and sinus congestion.  Her blood pressure at that time was 192/131.  She worked at [employer] and walked a lot at work.  She had random episodes of chest pain and diaphoresis.  The pain was on her R side and under her R breast.  It started sharp and then subsided, sometimes associated with heart racing.  Occurred mostly at rest and regularly with exertion.  She did not complete formal exercise outside of work.  She started noticing shortness of breath when walking.  She continued to smoke 1 pack of cigarettes daily.  In the past she tried quitting with Chantix, gum, and she thinks she used Wellbutrin but was willing to try again.  There was concern for pheochromocytoma.  Plasma catecholamines and metanephrines were negative.  Denise Carter had been under a lot of stress because of her son.  He was still living in [place] and often into a lot of trouble with the law.  She was having some chest pain and was referred for coronary CT-A 05/2019 that revealed normal coronary arteries.  She had shortness of breath and had an echo that revealed LVEF 60 to 65% with grade 2 diastolic dysfunction.  Right atrial pressure  was 3 mmHg.  Testing we negative for hyperaldosteronism.  She was referred to endocrinology and they did not feel her labs were consistent with Cushing syndrome.  She was referred for sleep study and was found to have sleep apnea.  CPAP was recommended.  She underwent multiple medication changes and had been working with our pharmacist.  Valsartan was switched to irbesartan.  She was referred to PREP for exercise but unable to participate due to her work schedule.  She saw our pharmacist on 09/2019.  At that time her blood pressure was 170/112.  Spironolactone was increased to 50 mg.  However she did not understand this change and had only been taking 25 mg.  Renal function and potassium remained stable with this change.  She noted facial droop and was referred for CT of the head that was normal.  She was trying to cut back on smoking and reduce stress.  Her arthritis medication was changed and her symptoms haven't been well-controlled.  She noted that her BP goes up when she is stressed.  Her lifting at work aggravated her joints and they get hot and swollen at the end of the day.  She had a poor appetite and wasn't eating much.  She didn't have much salt in her diet.  She noted pain in her legs walking at work.   She has struggled to tolerate her CPAP and is interested in the Denise Carter device.  She had an Echo 04/2021 with LVEF 60-65%, mild LVH, and grade 1 diastolic dysfunction. She requested to see pulmonary due to her shortness of breath. She continues to smoke about 1 PPD of cigarettes daily. CT showed lung nodules and emphysema, they recommended increasing her Advair to twice a day. She last saw [facility] 10/2021 and blood pressure remained uncontrolled. She was taking carvedilol once a day and 100 mg spironolactone rather than 200 mg. Denise Carter recommended increasing her medications to the prescribed doses.  At her last appointment, her blood pressure remained elevated despite compliance with her medication. She  was encouraged to increase her exercise and recommended smoking cessation. We started her on 0.1 mg clonidine BID. She was referred for renal denervation with Dr. Fletcher Carter. Repeat sleep study to see if she was a candidate for [employer]ise [employer] was pending. Unable to tolerate CPAP.  Today, she states that she is feeling alright. However, in clinic her blood pressure is elevated to 193/117 (198/114 on manual recheck). She notes that she recently got off work, and it is typical for her blood pressures to be elevated in the afternoons. When she first wakes up her blood pressure is closer to 139/120, with her lowest diastolic pressures in the 100s. No significant difference in her blood pressure with adding the clonidine. Usually she takes all of her medications in the morning around 7:30 AM, and takes her evening doses of the twice daily meds at 5 PM. Currently, she has been off of Lyrica and Plaquenil for months. She is in significant pain, and has used up her leftover gabapentin. Usually she has 1 cup of coffee when she returns home from work, and one in the morning before work. This is down from 3-4 cups of coffee a day. At work she remains active with frequent walking back and forth at work. Additionally she complains of urinary frequency. She continues to smoke about 1 pack of cigarettes per day, either at home or break time. She has seen pulmonology and reports that she is not qualified for inspire as her sleep apnea is not severe enough. She denies any palpitations, chest pain, shortness of breath, or peripheral edema. No lightheadedness, headaches, syncope, orthopnea, or PND.  Previous antihypertensives: Lisinopril HCTZ Verapamil Amlodipine - headache labetalolol- didn't work Atenolol worked some in [place]. Hydralazine - headaches  Past Medical History:  Diagnosis Date   Atypical chest pain 05/23/2019   Essential hypertension 05/23/2019   Hypertension    Lupus (HCC)    OSA (obstructive sleep apnea) 11/19/2019    Resistant hypertension 05/23/2019   Shortness of breath 05/23/2019   Snoring 05/23/2019   Thyroid disease    Varicose veins of left lower extremity 01/26/2020    Past Surgical History:  Procedure Laterality Date   ABDOMINAL HYSTERECTOMY       Current Outpatient Medications  Medication Sig Dispense Refill   albuterol (VENTOLIN HFA) 108 (90 Base) MCG/ACT inhaler Inhale 2 puffs into the lungs every 6 (six) hours as needed for wheezing or shortness of breath. 8 g 6   amLODipine (NORVASC) 5 MG tablet Take 1 tablet (5 mg total) by mouth daily. 90 tablet 1   aspirin (EC-81 ASPIRIN) 81 MG EC tablet Take 1 tablet (81 mg total) by mouth daily. Swallow whole. 30 tablet 12   atorvastatin (LIPITOR) 40 MG tablet Take 1 tablet (40 mg total) by mouth daily. 90 tablet 1   carvedilol (COREG) 25 MG tablet Take 1 tablet (25 mg total) by mouth 2 (two) times  daily. 180 tablet 1   chlorthalidone (HYGROTON) 25 MG tablet Take 1 tablet (25 mg total) by mouth daily. 90 tablet 1   FLUoxetine (PROZAC) 20 MG tablet Take 1 tablet (20 mg total) by mouth daily. 30 tablet 2   fluticasone (FLONASE) 50 MCG/ACT nasal spray Place 2 sprays into both nostrils daily. 91 g 1   fluticasone-salmeterol (ADVAIR) 250-50 MCG/ACT AEPB Inhale 1 puff into the lungs every 12 (twelve) hours. 60 each 11   ibuprofen (ADVIL) 200 MG tablet Take 200 mg by mouth every 6 (six) hours as needed.     irbesartan (AVAPRO) 300 MG tablet Take 1 tablet (300 mg total) by mouth daily. 90 tablet 1   KLS OMPERAZOLE 20 MG TBEC Take 1 tablet by mouth daily.     pantoprazole (PROTONIX) 20 MG tablet Take 1 tablet (20 mg total) by mouth daily. 90 tablet 3   pregabalin (LYRICA) 200 MG capsule Take 1 capsule (200 mg total) by mouth 2 (two) times daily. 60 capsule 0   spironolactone (ALDACTONE) 100 MG tablet Take 2 tablets (200 mg total) by mouth daily. 180 tablet 1   tamsulosin (FLOMAX) 0.4 MG CAPS capsule Take 1 capsule (0.4 mg total) by mouth daily. 15  capsule 0   cloNIDine (CATAPRES) 0.1 MG tablet Take 1 tablet (0.1 mg total) by mouth 3 (three) times daily. 270 tablet 3   No current facility-administered medications for this visit.    Allergies:   Labetalol    Social History:  The patient  reports that she has been smoking cigarettes. She started smoking about 31 years ago. She has been smoking an average of 1 pack per day. She has never used smokeless tobacco. She reports that she does not drink alcohol and does not use drugs.   Family History:  The patient's family history includes Breast cancer (age of onset: 51 - 67) in her maternal aunt; CAD in her maternal grandmother; Heart failure in her maternal grandmother; Hypertension in her maternal grandmother and mother; Lupus in her maternal grandmother; Stroke in her maternal grandmother.    ROS:   Please see the history of present illness.    (+) Myalgias (+) Urinary frequency All other systems are reviewed and negative.    PHYSICAL EXAM: VS:  BP (!) 198/114 (BP Location: Right Arm, Patient Position: Sitting, Cuff Size: Normal)   Pulse 88   Ht 5\' 4"  (1.626 m)   Wt 170 lb 4.8 oz (77.2 kg)   SpO2 97%   BMI 29.23 kg/m  , BMI Body mass index is 29.23 kg/m. GENERAL:  Well appearing HEENT: Pupils equal round and reactive, fundi not visualized, oral mucosa unremarkable NECK:  No jugular venous distention, waveform within normal limits, carotid upstroke brisk and symmetric, no bruits LUNGS:  Clear to auscultation bilaterally HEART:  RRR.  PMI not displaced or sustained,S1 and S2 within normal limits, no S3, no S4, no clicks, no rubs, no murmurs ABD:  Flat, positive bowel sounds normal in frequency in pitch, no bruits, no rebound, no guarding, no midline pulsatile mass, no hepatomegaly, no splenomegaly EXT:  1 plus pulses throughout, no edema, no cyanosis no clubbing SKIN:  No rashes no nodules NEURO:  Cranial nerves II through XII grossly intact, motor grossly intact  throughout PSYCH:  Cognitively intact, oriented to person place and time  EKG:  EKG is personally reviewed. 04/14/2022:  EKG was not ordered. 03/12/2022:  Sinus rhythm. Rate 85 bpm. LAFB. LVH. 05/23/2019: sinus rhythm.  Rate 82  bpm.  Nonspecific ST changes.  LAFB.  Cannot rule out anteroseptal infarct    CT Chest  05/22/2021: IMPRESSION: Lung-RADS 2, benign appearance or behavior. Continue annual screening with low-dose chest CT without contrast in 12 months.   Aortic Atherosclerosis (ICD10-I70.0) and Emphysema (ICD10-J43.9).  Echo  05/15/2021:  1. Left ventricular ejection fraction, by estimation, is 60 to 65%. Left  ventricular ejection fraction by 3D volume is 59 %. The left ventricle has  normal function. The left ventricle has no regional wall motion  abnormalities. There is mild concentric  left ventricular hypertrophy. Left ventricular diastolic parameters are  consistent with Grade I diastolic dysfunction (impaired relaxation).   2. Right ventricular systolic function is normal. The right ventricular  size is normal. There is normal pulmonary artery systolic pressure. The  estimated right ventricular systolic pressure is [id number] mmHg.   3. Left atrial size was moderately dilated.   4. The mitral valve is grossly normal. No evidence of mitral valve  regurgitation. The mean mitral valve gradient is 1.0 mmHg.   5. The aortic valve is tricuspid. Aortic valve regurgitation is mild. No  aortic stenosis is present.   6. The inferior vena cava is normal in size with greater than 50%  respiratory variability, suggesting right atrial pressure of 3 mmHg.   Comparison(s): No significant change from prior study. EF 60%.   Echo 05/2019: 1. Left ventricular ejection fraction, by estimation, is 60 to 65%. The  left ventricle has normal function. The left ventricle has no regional  wall motion abnormalities. There is mild concentric left ventricular  hypertrophy. Left ventricular diastolic   parameters are consistent with Grade II diastolic dysfunction  (pseudonormalization). Elevated left ventricular end-diastolic pressure.   2. Right ventricular systolic function is normal. The right ventricular  size is normal. There is normal pulmonary artery systolic pressure.   3. Left atrial size was mildly dilated.   4. The mitral valve is normal in structure. Trivial mitral valve  regurgitation. No evidence of mitral stenosis.   5. The aortic valve is tricuspid. Aortic valve regurgitation is not  visualized. No aortic stenosis is present.   6. Aortic dilatation noted. There is mild dilatation of the ascending  aorta measuring 38 mm.   7. The inferior vena cava is normal in size with greater than 50%  respiratory variability, suggesting right atrial pressure of 3 mmHg.   Recent Labs: 02/16/2022: BUN 8; Creatinine, Ser 0.85; Potassium 3.7; Sodium 144    Lipid Panel    Component Value Date/Time   CHOL 164 02/06/2021 1141   TRIG 88 02/06/2021 1141   HDL 53 02/06/2021 1141   CHOLHDL 3.1 02/06/2021 1141   LDLCALC 95 02/06/2021 1141    Wt Readings from Last 3 Encounters:  04/14/22 170 lb 4.8 oz (77.2 kg)  03/12/22 168 lb 8 oz (76.4 kg)  03/08/22 170 lb 9.6 oz (77.4 kg)      ASSESSMENT AND PLAN:  # Resistant Hypertension: - The patient has reported elevated blood pressure readings, with morning levels at approximately 139/120 and afternoon levels around 157/120. BP is significantly elevated today despite taking 6 antihypertensives as prescribed. - Plan: Increase Clonidine dosage to 0.1 mg three times daily at 7:30 am, 12 pm, and 5 pm to manage the afternoon spikes in blood pressure.  Continue amlodipine, carvedilol, chlorthalidone, irbesartan, and spironolactone.  We have ordered renal denervation and insurance approval is pending.  We checked on that today and it seems that it would be 5-7  more days.  Screening for Secondary Hypertension:      Relevant Labs/Studies:     Latest Ref Rng & Units 02/16/2022   11:42 AM 03/04/2021    9:39 AM 02/06/2021   11:41 AM  Basic Labs  Sodium 134 - 144 mmol/L 144  141  145   Potassium 3.5 - 5.2 mmol/L 3.7  4.7  4.4   Creatinine 0.57 - 1.00 mg/dL 0.85  0.85  0.75        Latest Ref Rng & Units 06/05/2020    7:56 AM 07/23/2019    2:04 PM  Thyroid   TSH 0.35 - 4.50 uIU/mL 1.82  0.97          Latest Ref Rng & Units 07/23/2019    2:04 PM 05/23/2019    9:25 AM  Metanephrines/Catecholamines   Epinephrine pg/mL <20    Norepinephrine pg/mL 319    Dopamine pg/mL <10    Metanephrines <=57 pg/mL 56  51.8   Normetanephrines  <=148 pg/mL 84  103.3           01/23/2018   12:26 PM  Renovascular   Renal Artery [place] Completed Yes    # Rheumatoid Arthritis: # SLE: - The patient is not currently on Lyrica, Plaquenil, or Renflexis due to the absence of a rheumatologist, resulting in significant pain and difficulty performing daily activities. - Plan: Encourage the patient to attend their upcoming rheumatology appointment and seek additional rheumatology services locally. Monitor pain levels and consider alternative pain management strategies as necessary.  # Obstructive Sleep Apnea: - The patient does not qualify for the Inspire device and has declined the use of a CPAP machine. - Plan: Address the patient's concerns with their pulmonologist and look into other sleep apnea treatments. Keep track of sleep quality and related symptoms.  # Smoking: - The patient smokes around one pack of cigarettes daily but has reduced smoking in the car. - Plan: Support the patient in further smoking reduction and offer smoking cessation resources. Discuss the effects of smoking on blood pressure and general health.   Medication Adjustments/Labs and Tests Ordered: Current medicines are reviewed at length with the patient today.  Concerns regarding medicines are outlined above.   No orders of the defined types were placed in this  encounter.  Meds ordered this encounter  Medications   cloNIDine (CATAPRES) 0.1 MG tablet    Sig: Take 1 tablet (0.1 mg total) by mouth 3 (three) times daily.    Dispense:  270 tablet    Refill:  3    NEW DOSE,D/C TWICE A DAY RX   Patient Instructions  Medication Instructions:  INCREASE CLONIDINE TO THREE TIMES A DAY   Labwork: NONE  Testing/Procedures: NONE   Follow-Up: 07/19/2022 10:00 AM WITH DR Clarendon   Any Other Special Instructions Will Be Listed Below (If Applicable).     If you need a refill on your cardiac medications before your next appointment, please call your pharmacy.  I,Mathew Stumpf,acting as a Education administrator for Denise Latch, MD.,have documented all relevant documentation on the behalf of Denise Latch, MD,as directed by  Denise Latch, MD while in the presence of Denise Latch, MD.  I, [place] Oval Linsey, MD have reviewed all documentation for this visit.  The documentation of the exam, diagnosis, procedures, and orders on 04/14/2022 are all accurate and complete.  Signed, Yazeed Pryer C. Oval Linsey, MD, [facility]  04/14/2022 3:43 PM    [place]

## 2022-04-14 NOTE — Telephone Encounter (Signed)
Pt states she spoke to her insurance today and they need clinical (pre-determination) information for pt's procedure. Unsure what procedure she's referring to but she stated this was talked about at her appt today. She also stated it was processed under a different Dr. Rogue Jury" maybe Dr. Fletcher Anon. Please advise.

## 2022-04-15 NOTE — Telephone Encounter (Signed)
Good morning,    Clinical Pre-Determination was sent last month. (Ref # [id number]) The Pre D was submitted with Dr. Tyrell Antonio info since he will be the one performing the renal denervation. Yesterday, I called the insurance company to check on the status because it can take 30-45 days for a determination and was informed that Holland Falling will need another 5-7 days.    I will call to make sure that the predetermination that I submitted last month was not withdrawn due to confusion about the MD.    Thank you all,    Estill Bamberg

## 2022-04-27 ENCOUNTER — Ambulatory Visit: Payer: 59 | Admitting: Family Medicine

## 2022-04-27 NOTE — Telephone Encounter (Signed)
Hi team any updates here?

## 2022-04-30 ENCOUNTER — Encounter (HOSPITAL_BASED_OUTPATIENT_CLINIC_OR_DEPARTMENT_OTHER): Payer: Self-pay | Admitting: Cardiovascular Disease

## 2022-04-30 NOTE — Telephone Encounter (Signed)
Hi there, following again for this patient

## 2022-05-03 NOTE — Telephone Encounter (Signed)
Update

## 2022-05-03 NOTE — Telephone Encounter (Signed)
Hi can you provide any insight into this?

## 2022-05-14 IMAGING — US US THYROID
1 series · 13 of 25 positions shown · non-contrast
Comparison: April 11, 2017.

CLINICAL DATA: Thyroid nodule follow-up

EXAM:
THYROID ULTRASOUND
TECHNIQUE: Ultrasound examination of the thyroid gland and adjacent soft
tissues was performed.

[Series 1: us thyroid · 0.04mm/px · 13 of 60 slices shown]
[im 1/60]
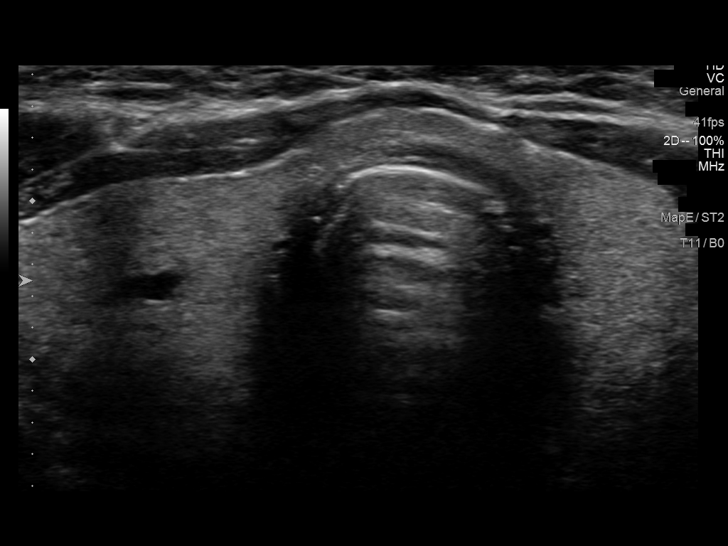
[im 5/60]
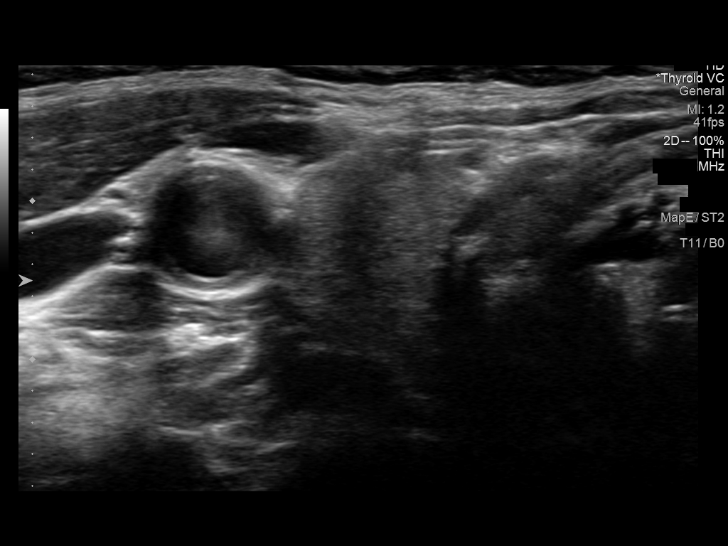
[im 10/60]
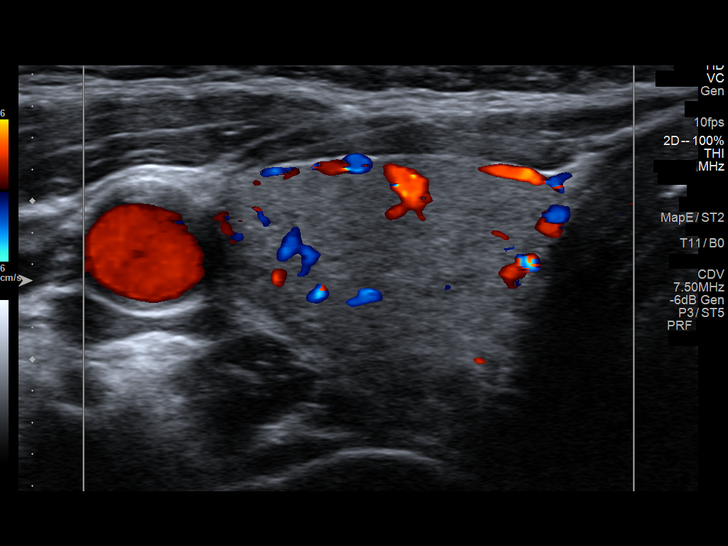
[im 15/60]
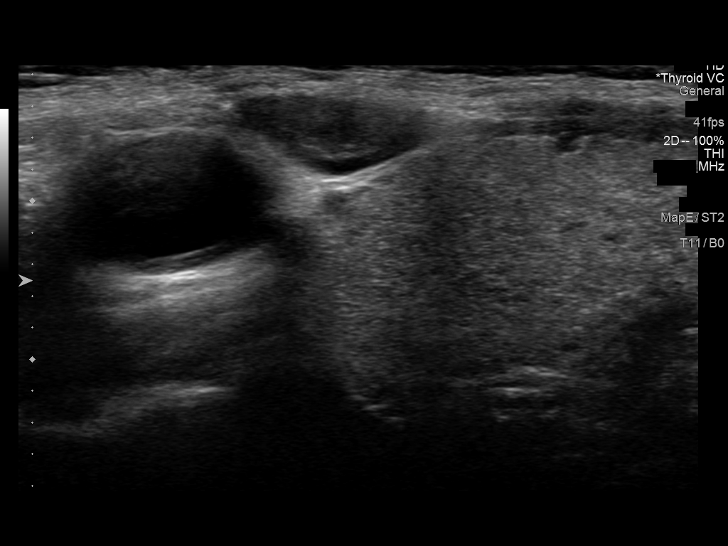
[im 20/60]
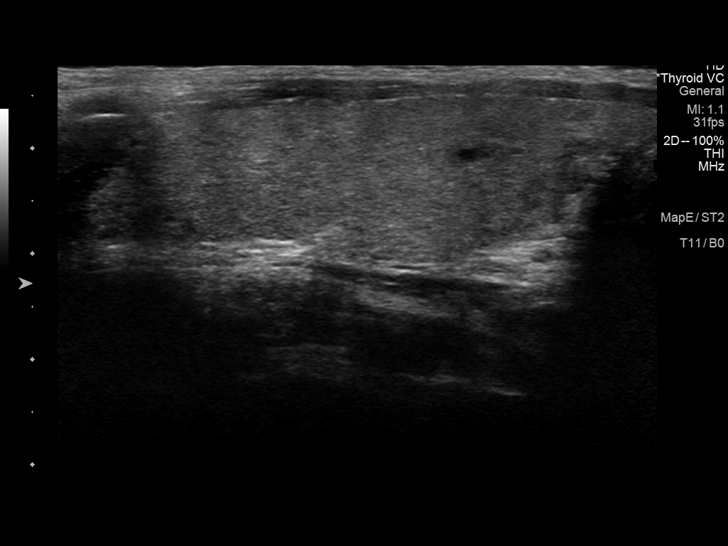
[im 25/60]
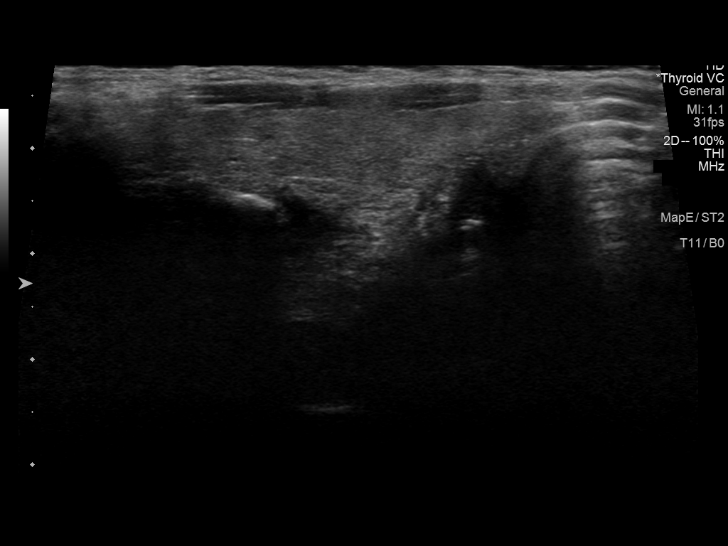
[im 30/60]
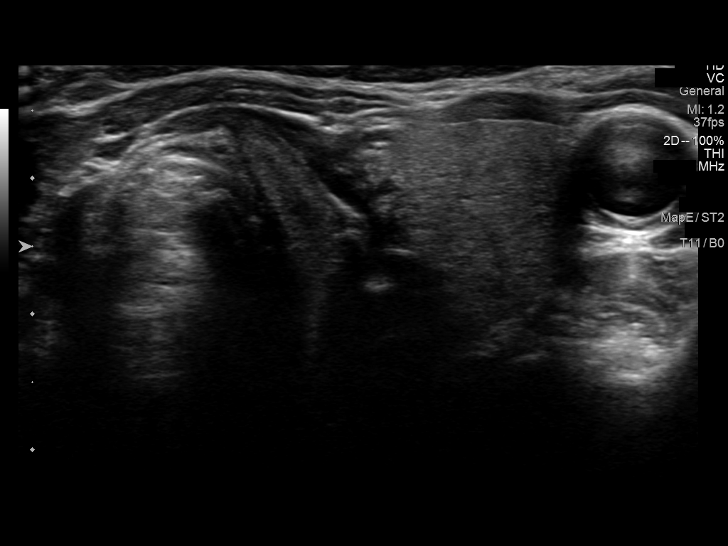
[im 35/60]
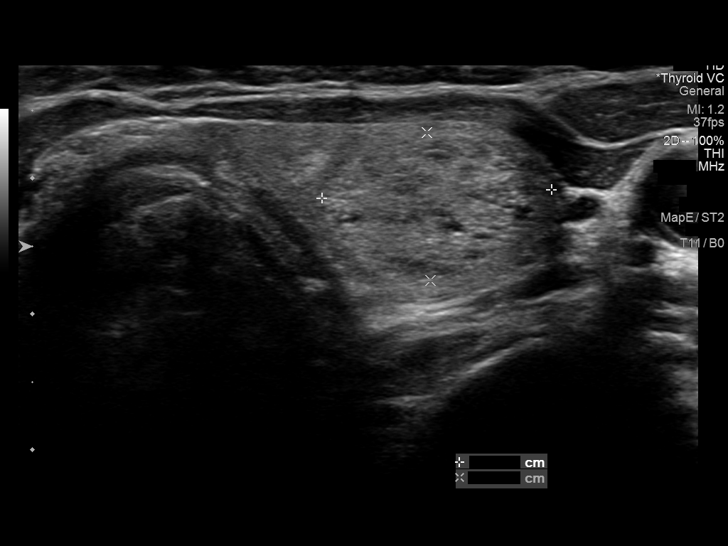
[im 40/60]
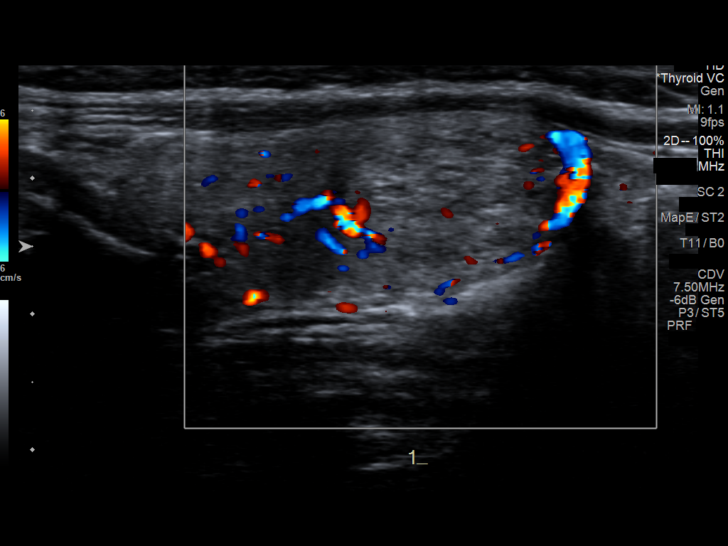
[im 45/60]
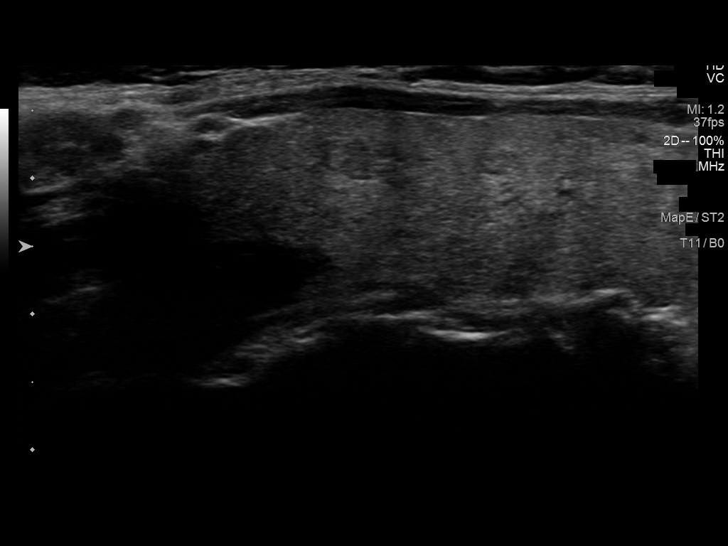
[im 50/60]
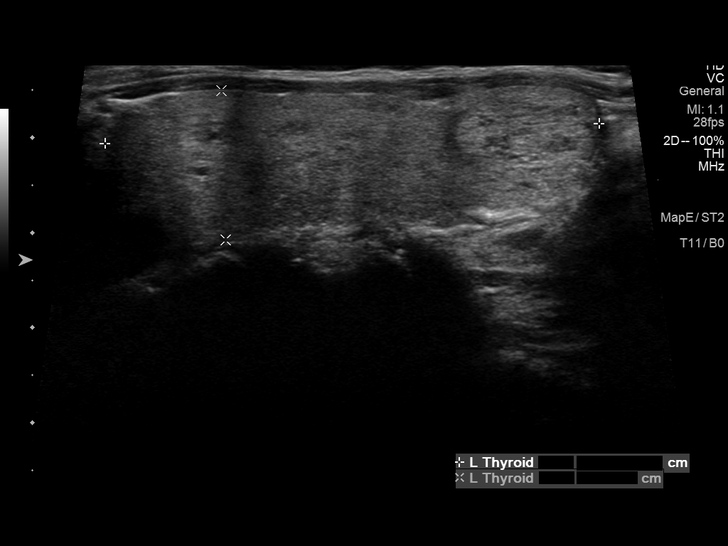
[im 55/60]
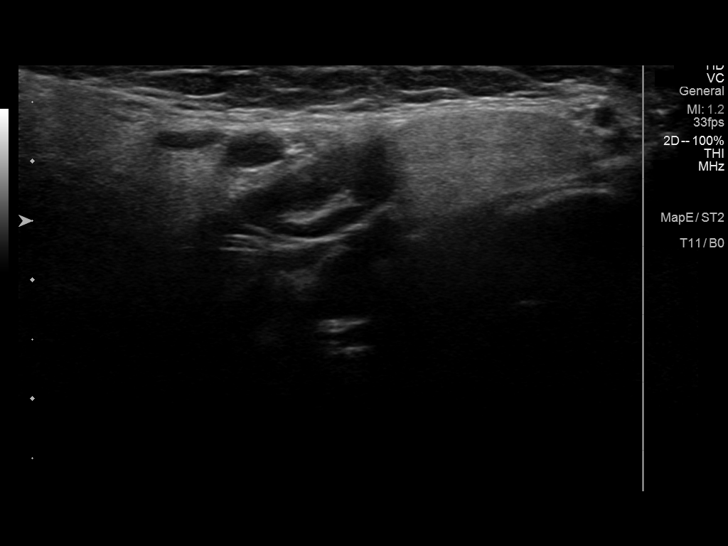
[im 60/60]
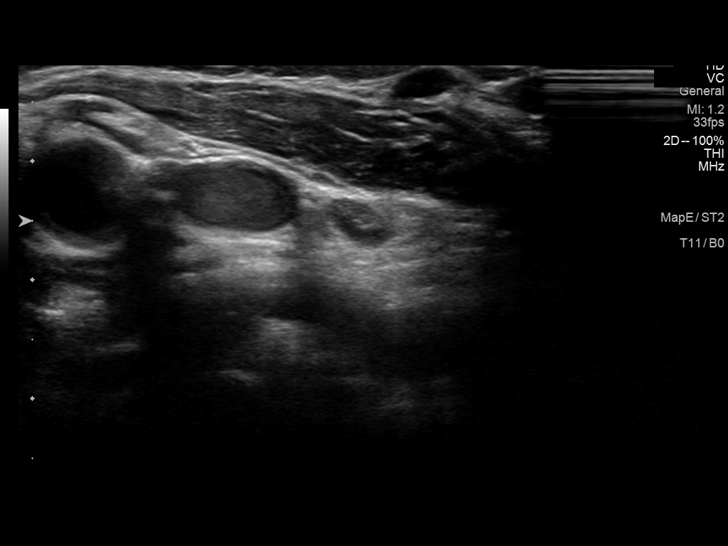

[13 of 25 positions shown; findings below may reference images not displayed]

FINDINGS: Parenchymal Echotexture: Mildly heterogenous

Isthmus: 0.2 cm

Right lobe: 5.5 x 1.6 x 2.2 cm

Left lobe: 5.2 x 1.6 x 2.3 cm

_________________________________________________________

Estimated total number of nodules >/= 1 cm: 1

Number of spongiform nodules >/=  2 cm not described below (TR1): 0

Number of mixed cystic and solid nodules >/= 1.5 cm not described
below (TR2): 0

_________________________________________________________

Nodule # 1:

Prior biopsy: No

Location: Left; Inferior

Maximum size: 1.7 cm cm; Other 2 dimensions: 1.7 x 1.1 cm,
previously, 1.5 x 1.4 x 1.2 cm

Composition: solid/almost completely solid (2)

Echogenicity: isoechoic (1)

Shape: not taller-than-wide (0)

Margins: ill-defined (0)

Echogenic foci: none (0)

ACR TI-RADS total points: 3.

ACR TI-RADS risk category:  TR3 (3 points).

Significant change in size (>/= 20% in two dimensions and minimal
increase of 2 mm): No

Change in features: No

Change in ACR TI-RADS risk category: No

ACR TI-RADS recommendations:

*Given size (>/= 1.5 - 2.4 cm) and appearance, a follow-up
ultrasound in 1 year should be considered based on TI-RADS criteria.

_________________________________________________________

No new thyroid nodules identified on this study.
IMPRESSION: There is a stable TR 3 thyroid nodule in the left inferior thyroid
gland. This thyroid nodule has demonstrated stability since 6096. A
follow-up study in 2 years is recommended to document a full 5 years
of stability.

The above is in keeping with the ACR TI-RADS recommendations - [HOSPITAL] 3062;[DATE].

## 2022-05-24 ENCOUNTER — Ambulatory Visit (HOSPITAL_BASED_OUTPATIENT_CLINIC_OR_DEPARTMENT_OTHER)
Admission: RE | Admit: 2022-05-24 | Discharge: 2022-05-24 | Disposition: A | Discharge status: Home / Self Care | Payer: 59 | Source: Ambulatory Visit | Attending: Family Medicine | Admitting: Family Medicine

## 2022-05-24 ENCOUNTER — Encounter: Payer: Self-pay | Admitting: Pulmonary Disease

## 2022-05-24 DIAGNOSIS — R911 Solitary pulmonary nodule: Secondary | ICD-10-CM

## 2022-05-24 DIAGNOSIS — Z122 Encounter for screening for malignant neoplasm of respiratory organs: Secondary | ICD-10-CM | POA: Insufficient documentation

## 2022-05-24 DIAGNOSIS — J438 Other emphysema: Secondary | ICD-10-CM

## 2022-05-24 DIAGNOSIS — R0609 Other forms of dyspnea: Secondary | ICD-10-CM | POA: Insufficient documentation

## 2022-05-24 DIAGNOSIS — I700 Atherosclerosis of aorta: Secondary | ICD-10-CM | POA: Diagnosis not present

## 2022-05-24 DIAGNOSIS — F17200 Nicotine dependence, unspecified, uncomplicated: Secondary | ICD-10-CM | POA: Diagnosis not present

## 2022-05-24 DIAGNOSIS — J439 Emphysema, unspecified: Secondary | ICD-10-CM | POA: Insufficient documentation

## 2022-05-27 ENCOUNTER — Encounter (HOSPITAL_BASED_OUTPATIENT_CLINIC_OR_DEPARTMENT_OTHER): Payer: Self-pay | Admitting: Cardiovascular Disease

## 2022-05-27 ENCOUNTER — Ambulatory Visit (INDEPENDENT_AMBULATORY_CARE_PROVIDER_SITE_OTHER): Payer: 59 | Admitting: Pulmonary Disease

## 2022-05-27 ENCOUNTER — Encounter: Payer: Self-pay | Admitting: Pulmonary Disease

## 2022-05-27 VITALS — BP 142/102 | HR 81 | Ht 64.0 in | Wt 169.0 lb

## 2022-05-27 DIAGNOSIS — J432 Centrilobular emphysema: Secondary | ICD-10-CM

## 2022-05-27 DIAGNOSIS — Z87891 Personal history of nicotine dependence: Secondary | ICD-10-CM | POA: Diagnosis not present

## 2022-05-27 DIAGNOSIS — Z716 Tobacco abuse counseling: Secondary | ICD-10-CM | POA: Diagnosis not present

## 2022-05-27 MED ORDER — TRELEGY ELLIPTA 100-62.5-25 MCG/ACT IN AEPB: 1.0000 | INHALATION_SPRAY | Freq: Every day | RESPIRATORY_TRACT | 5 refills | Status: DC

## 2022-05-27 MED ORDER — NICOTINE 14 MG/24HR TD PT24: 14.0000 mg | MEDICATED_PATCH | Freq: Every day | TRANSDERMAL | 2 refills | Status: DC

## 2022-05-27 MED ORDER — BUPROPION HCL ER (SR) 150 MG PO TB12
150.0000 mg | ORAL_TABLET | Freq: Two times a day (BID) | ORAL | 3 refills | Status: AC
Start: 2022-05-27 — End: ?

## 2022-05-27 NOTE — Progress Notes (Signed)
Denise Carter    [id number]    22-Jan-1971  Primary Care Physician:Wilson, Lauris Poag, MD  Referring Physician: Georganna Skeans, MD [address] [place],  [place] [address]  Chief complaint:   Patient is being seen for shortness of breath  HPI:  Has had no significant exacerbation of symptoms Still continues to be short of breath with any activity -Has been using Advair but still noticing she gets quite short of breath  Continues to smoke about a pack a day but was very receptive to discussions about quitting smoking  History of mild obstructive sleep apnea Sjogren's syndrome Chronic fatigue syndrome Polyneuropathy due to lupus History of rheumatoid arthritis Psoriatic arthritis Polyclonal gammopathy  Sometimes gets short of breath just at rest  Recent sleep study does reveal mild obstructive sleep apnea  No pertinent occupational history  Recent CT with lung nodules and emphysema  Outpatient Encounter Medications as of 05/27/2022  Medication Sig   albuterol (VENTOLIN HFA) 108 (90 Base) MCG/ACT inhaler Inhale 2 puffs into the lungs every 6 (six) hours as needed for wheezing or shortness of breath.   amLODipine (NORVASC) 5 MG tablet Take 1 tablet (5 mg total) by mouth daily.   aspirin (EC-81 ASPIRIN) 81 MG EC tablet Take 1 tablet (81 mg total) by mouth daily. Swallow whole.   atorvastatin (LIPITOR) 40 MG tablet Take 1 tablet (40 mg total) by mouth daily.   chlorthalidone (HYGROTON) 25 MG tablet Take 1 tablet (25 mg total) by mouth daily.   cloNIDine (CATAPRES) 0.1 MG tablet Take 1 tablet (0.1 mg total) by mouth 3 (three) times daily.   FLUoxetine (PROZAC) 20 MG tablet Take 1 tablet (20 mg total) by mouth daily.   fluticasone (FLONASE) 50 MCG/ACT nasal spray Place 2 sprays into both nostrils daily.   fluticasone-salmeterol (ADVAIR) 250-50 MCG/ACT AEPB Inhale 1 puff into the lungs every 12 (twelve) hours.   ibuprofen (ADVIL) 200 MG tablet Take 200 mg  by mouth every 6 (six) hours as needed.   irbesartan (AVAPRO) 300 MG tablet Take 1 tablet (300 mg total) by mouth daily.   KLS OMPERAZOLE 20 MG TBEC Take 1 tablet by mouth daily.   pantoprazole (PROTONIX) 20 MG tablet Take 1 tablet (20 mg total) by mouth daily.   pregabalin (LYRICA) 200 MG capsule Take 1 capsule (200 mg total) by mouth 2 (two) times daily.   spironolactone (ALDACTONE) 100 MG tablet Take 2 tablets (200 mg total) by mouth daily.   tamsulosin (FLOMAX) 0.4 MG CAPS capsule Take 1 capsule (0.4 mg total) by mouth daily.   carvedilol (COREG) 25 MG tablet Take 1 tablet (25 mg total) by mouth 2 (two) times daily.   No facility-administered encounter medications on file as of 05/27/2022.    Allergies as of 05/27/2022 - Review Complete 05/27/2022  Allergen Reaction Noted   Labetalol Anxiety 10/25/2018    Past Medical History:  Diagnosis Date   Atypical chest pain 05/23/2019   Essential hypertension 05/23/2019   Hypertension    Lupus (HCC)    OSA (obstructive sleep apnea) 11/19/2019   Resistant hypertension 05/23/2019   Shortness of breath 05/23/2019   Snoring 05/23/2019   Thyroid disease    Varicose veins of left lower extremity 01/26/2020    Past Surgical History:  Procedure Laterality Date   ABDOMINAL HYSTERECTOMY      Family History  Problem Relation Age of Onset   Hypertension Mother    Breast cancer Maternal  Aunt 40 - 49   Hypertension Maternal Grandmother    Lupus Maternal Grandmother    Heart failure Maternal Grandmother    Stroke Maternal Grandmother    CAD Maternal Grandmother    Adrenal disorder Neg Hx     Social History   Socioeconomic History   Marital status: Married    Spouse name: Not on file   Number of children: Not on file   Years of education: Not on file   Highest education level: Not on file  Occupational History   Not on file  Tobacco Use   Smoking status: Every Day    Packs/day: 1    Types: Cigarettes    Start date: 01/26/1991    Smokeless tobacco: Never   Tobacco comments:    Patient is participating in health coaching for smoking cessation.   Vaping Use   Vaping Use: Never used  Substance and Sexual Activity   Alcohol use: No   Drug use: No   Sexual activity: Not on file  Other Topics Concern   Not on file  Social History Narrative   Right handed      Highest level of edu- 12th grade      7 children      Apartment- 2nd floor   Social Determinants of Health   Financial Resource Strain: High Risk (11/28/2019)   Overall Financial Resource Strain (CARDIA)    Difficulty of Paying Living Expenses: Very hard  Food Insecurity: No Food Insecurity (11/28/2019)   Hunger Vital Sign    Worried About Running Out of Food in the Last Year: Never true    Ran Out of Food in the Last Year: Never true  Transportation Needs: No Transportation Needs (11/28/2019)   PRAPARE - Administrator, Civil Service (Medical): No    Lack of Transportation (Non-Medical): No  Physical Activity: Inactive (05/23/2019)   Exercise Vital Sign    Days of Exercise per Week: 0 days    Minutes of Exercise per Session: 0 min  Stress: Stress Concern Present (03/05/2020)   [employer] of Occupational Health - Occupational Stress Questionnaire    Feeling of Stress : Rather much  Social Connections: Not on file  Intimate Partner Violence: Not on file    Review of Systems  Constitutional:  Positive for fatigue.  Respiratory:  Positive for shortness of breath.   Psychiatric/Behavioral:  Positive for sleep disturbance.     Vitals:   05/27/22 0855  BP: 138/88  Pulse: 81  SpO2: 100%     Physical Exam Constitutional:      Appearance: She is obese.  HENT:     Head: Normocephalic.     Mouth/Throat:     Mouth: Mucous membranes are moist.  Eyes:     General: No scleral icterus.    Pupils: Pupils are equal, round, and reactive to light.  Cardiovascular:     Rate and Rhythm: Normal rate and regular rhythm.     Heart  sounds: No murmur heard.    No friction rub.  Pulmonary:     Effort: No respiratory distress.     Breath sounds: No stridor. No wheezing or rhonchi.  Musculoskeletal:     Cervical back: No rigidity or tenderness.  Neurological:     Mental Status: She is alert.  Psychiatric:        Mood and Affect: Mood normal.    Data Reviewed: CT with 3 mm lung nodule, emphysema 05/22/2021-reviewed by myself-CT scan was reviewed with  the patient during the visit  PFT reviewed with the patient showing mild obstructive disease, air trapping suggestive of emphysema with no significant bronchodilator response  Assessment:  Emphysema on CT scan of the chest  An active smoker who is willing to quit -Options of treatment discussed -Did try Chantix in the past and did not tolerate it well  Lung nodule -Will continue with low-dose CT  History of lupus, rheumatoid arthritis  Trial with Trelegy in place of Advair  Daytime sleepiness   Plan/Recommendations:  Smoking cessation -Prescription for Wellbutrin -Prescription for nicotine patch  Trelegy 100 in place of Advair  Albuterol use as needed  Encouraged to give [place] a call with any significant concerns  I will see you in about 6 months  Encouraged to call if having issues, we can see earlier  The importance of quitting smoking was discussed extensively  Virl Diamond MD [facility] 05/27/2022, 9:00 AM  CC: Georganna Skeans, MD

## 2022-05-27 NOTE — Patient Instructions (Signed)
Prescription for Trelegy 100 in place of Advair  Continue to work on quitting smoking  Prescription for Wellbutrin  Prescription for nicotine patch  I will see you back in 6 months  Call us with significant concerns

## 2022-06-10 ENCOUNTER — Encounter: Payer: Self-pay | Admitting: Pulmonary Disease

## 2022-06-10 ENCOUNTER — Other Ambulatory Visit: Payer: Self-pay | Admitting: Family Medicine

## 2022-06-10 MED ORDER — PREGABALIN 200 MG PO CAPS: 200.0000 mg | ORAL_CAPSULE | Freq: Two times a day (BID) | ORAL | 0 refills | Status: DC

## 2022-06-10 NOTE — Telephone Encounter (Signed)
Mychart message sent by pt:  [facility] (supporting Adewale Renne Musca, MD)6 hours ago (9:33 AM)    I wanted to know the stage of the COPD I'm at     Dr. Val Eagle, please advise.

## 2022-06-19 NOTE — Telephone Encounter (Signed)
Routing encounter to Darilyn for review of FMLA forms that pt attached.   Routing to Dr Val Eagle also since her initial email asked the question- what stage is her COPD? Please advise, thanks!

## 2022-07-01 ENCOUNTER — Telehealth: Payer: Self-pay | Admitting: Pulmonary Disease

## 2022-07-01 NOTE — Telephone Encounter (Signed)
Patient was seen by Dr. Wynona Neat on 05/27/2022 - we received a FMLA form from [employer] on 5/23 and it is due back on 07/05/22.  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  Her job requires a lot of lifting, reaching and bending.  She has improved now that she is taking Trelogy, but still has flare-ups and she has been told by her employer she needs to get approved FMLA so that she does not lose her job.  Her flare-ups happen up to 4 times per month and last for several hours.  I have filled out the form, but Dr. Wynona Neat will not be back in the office until late June.  I will ask Dr. Isaiah Serge if he will sign the form.

## 2022-07-06 NOTE — Telephone Encounter (Signed)
Dr. Isaiah Serge signed both forms and I faxed income protection form to [employer] fax# [phone] and faxed FMLA request to [employer] fax# [phone].  Included office notes with FMLA form.  Mailed hard copy of both forms to patient.

## 2022-07-13 ENCOUNTER — Other Ambulatory Visit: Payer: Self-pay | Admitting: Family Medicine

## 2022-07-13 ENCOUNTER — Other Ambulatory Visit (HOSPITAL_BASED_OUTPATIENT_CLINIC_OR_DEPARTMENT_OTHER): Payer: Self-pay | Admitting: Family

## 2022-07-19 ENCOUNTER — Ambulatory Visit (HOSPITAL_BASED_OUTPATIENT_CLINIC_OR_DEPARTMENT_OTHER): Payer: 59 | Admitting: Cardiovascular Disease

## 2022-07-19 ENCOUNTER — Encounter (HOSPITAL_BASED_OUTPATIENT_CLINIC_OR_DEPARTMENT_OTHER): Payer: Self-pay

## 2022-07-22 IMAGING — CT CT HEAD W/O CM
3 of 4 series · 13 of 47 positions shown, 15 images · non-contrast
Comparison: None.

CLINICAL DATA: Stroke suspected

EXAM:
CT HEAD WITHOUT CONTRAST
TECHNIQUE: Contiguous axial images were obtained from the base of the skull
through the vertex without intravenous contrast.

[Series 3: head without · axial · non-contrast · 0.45mm/px · z∈[-105,+15]mm · 7 of 33 slices shown, 9 images]
[im 5/33  brain]
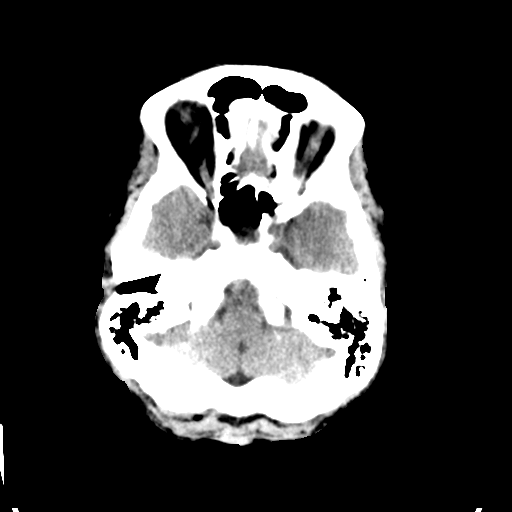
[im 5/33  bone]
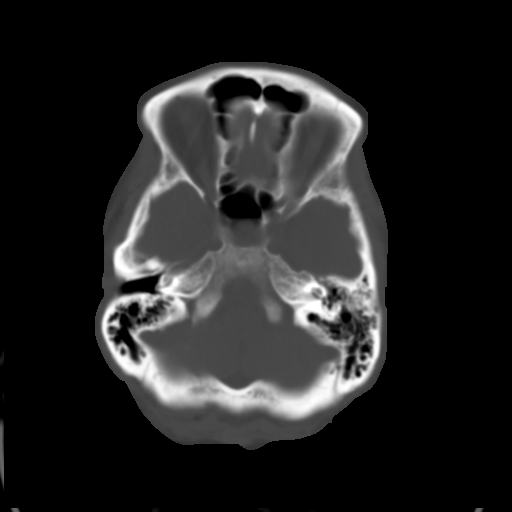
[im 9/33  brain]
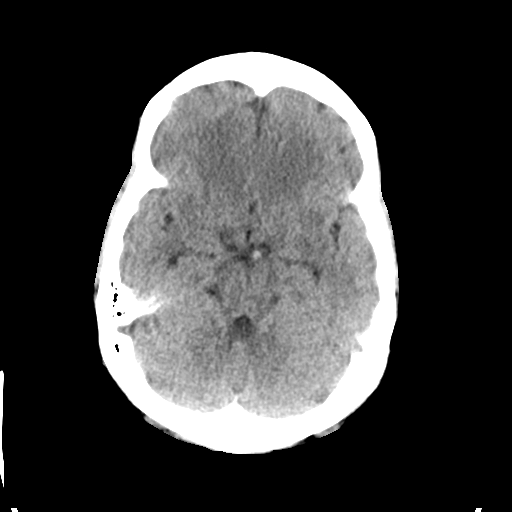
[im 13/33  brain]
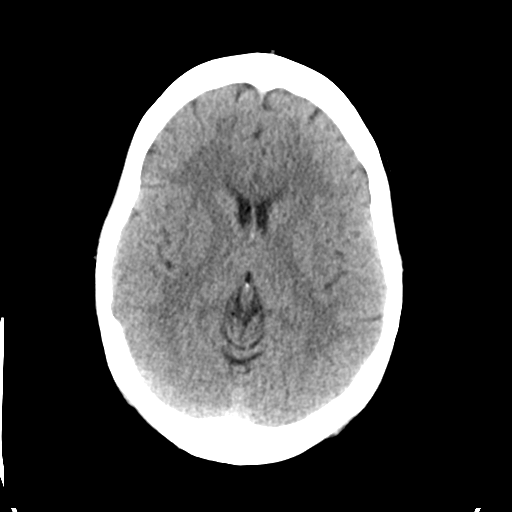
[im 17/33  brain]
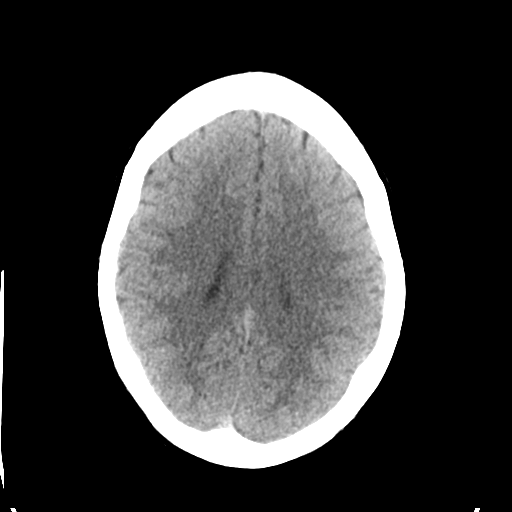
[im 21/33  brain]
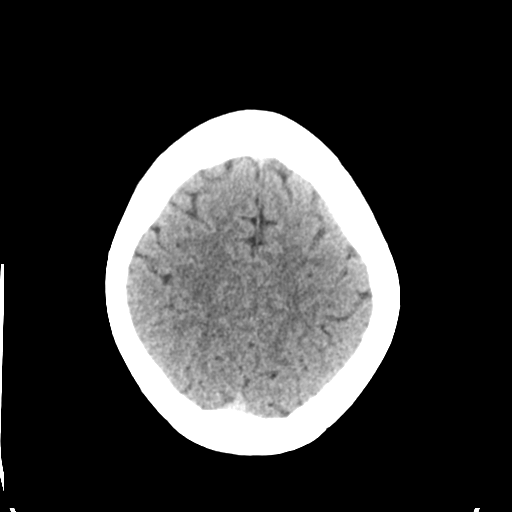
[im 21/33  bone]
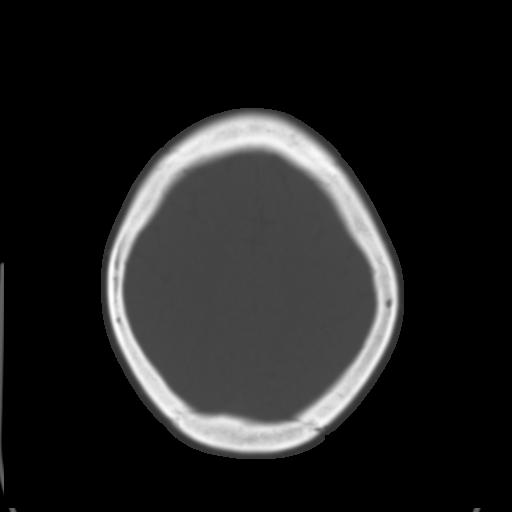
[im 25/33  brain]
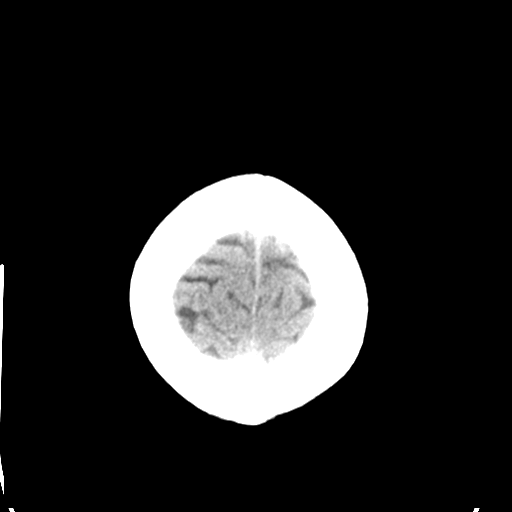
[im 29/33  brain]
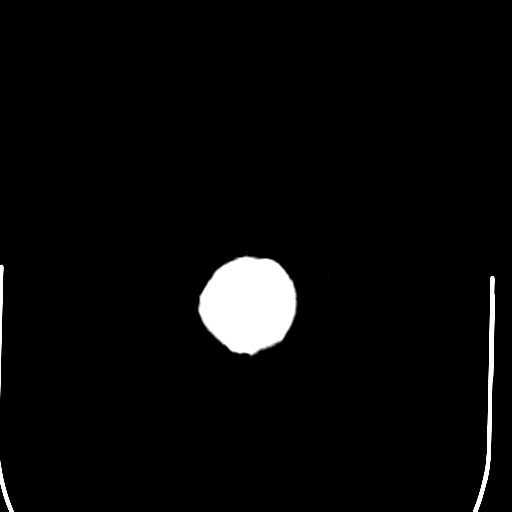

[Series 5: head without cor · coronal · non-contrast · 0.31mm/px · 3 of 68 slices shown]
[im 23/68  brain]
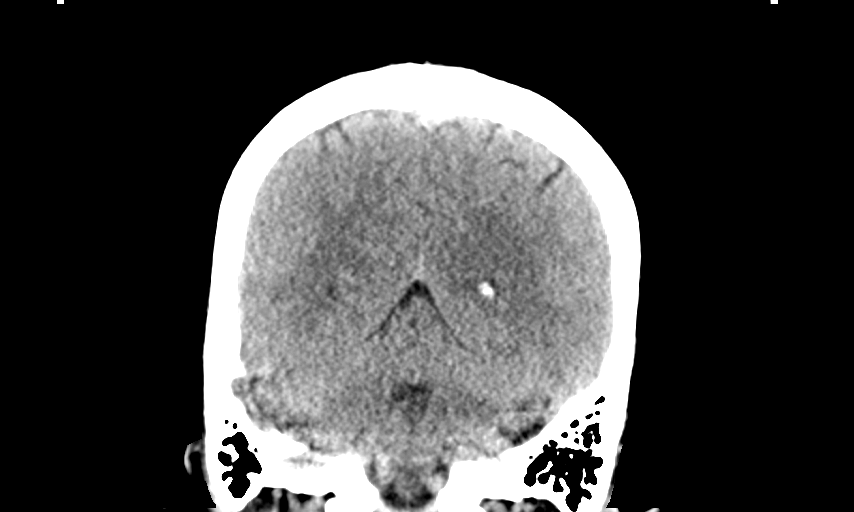
[im 30/68  brain]
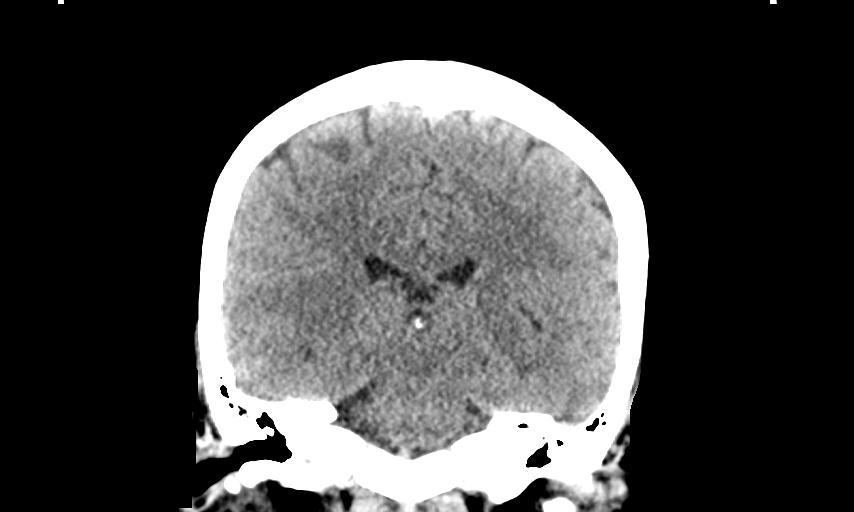
[im 38/68  brain]
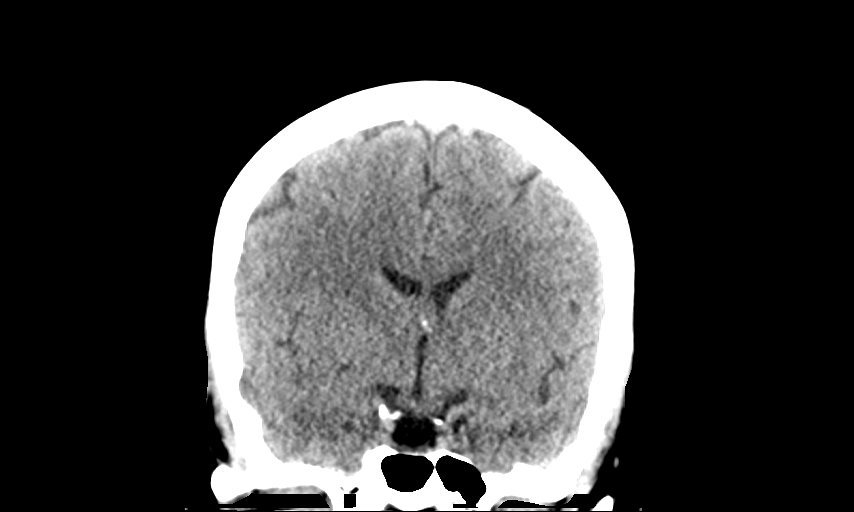

[Series 6: head without sag · sagittal · non-contrast · 0.31mm/px · 3 of 67 slices shown]
[im 23/67  brain]
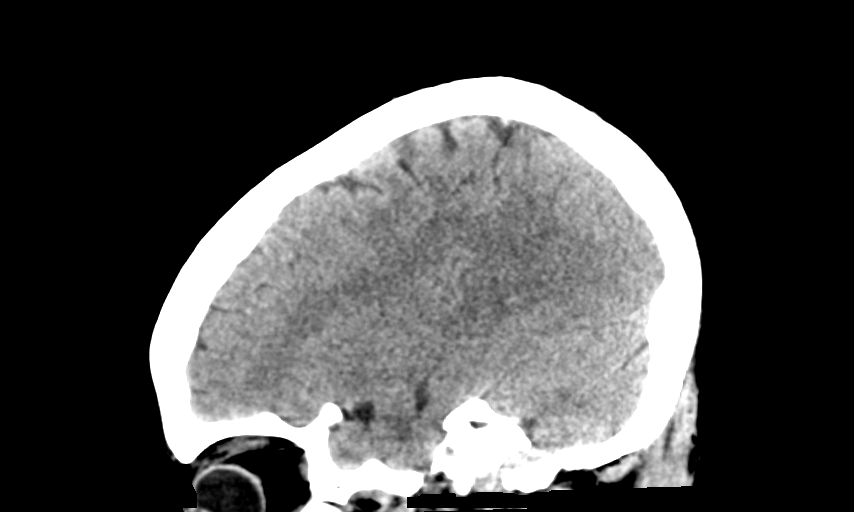
[im 34/67  brain]
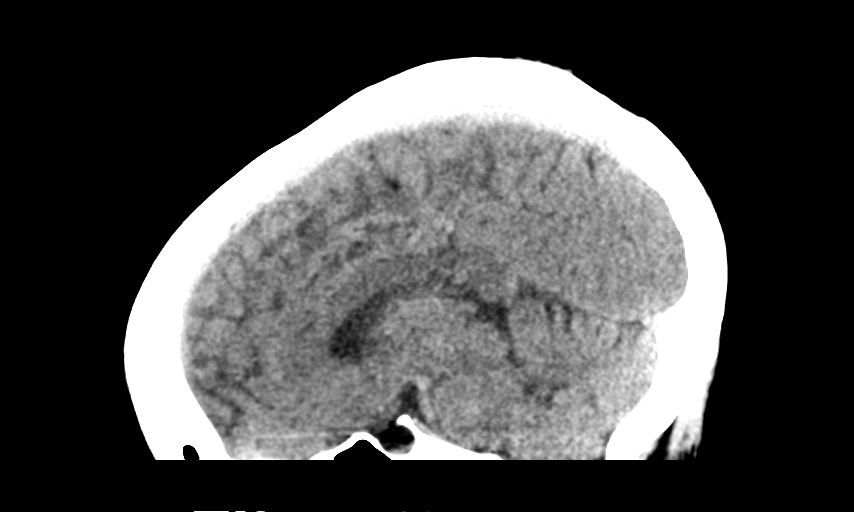
[im 45/67  brain]
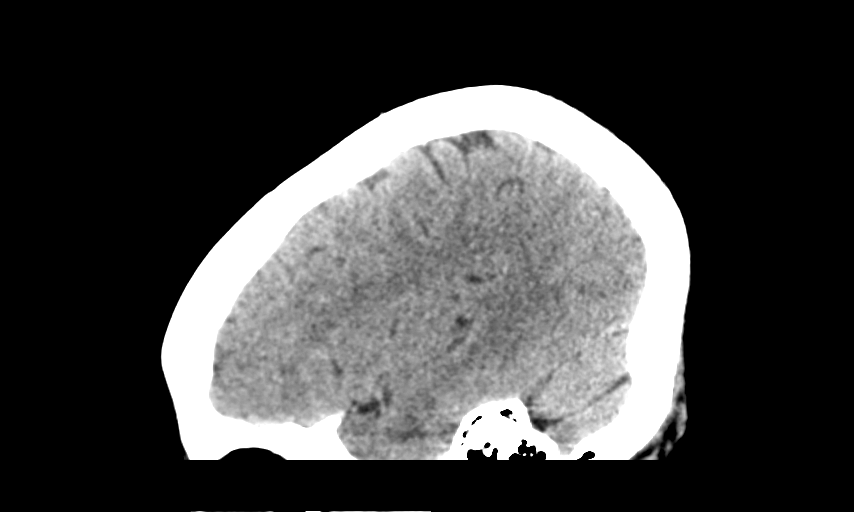

[13 of 47 positions shown; findings below may reference images not displayed]

FINDINGS: Brain: No acute intracranial abnormality. Specifically, no
hemorrhage, hydrocephalus, mass lesion, acute infarction, or
significant intracranial injury.

Vascular: No hyperdense vessel or unexpected calcification.

Skull: No acute calvarial abnormality.

Sinuses/Orbits: Visualized paranasal sinuses and mastoids clear.
Orbital soft tissues unremarkable.

Other: None
IMPRESSION: Normal study.

## 2022-08-16 ENCOUNTER — Other Ambulatory Visit: Payer: Self-pay | Admitting: Family Medicine

## 2022-08-25 ENCOUNTER — Ambulatory Visit (HOSPITAL_BASED_OUTPATIENT_CLINIC_OR_DEPARTMENT_OTHER): Payer: 59 | Admitting: Orthopaedic Surgery

## 2022-09-16 ENCOUNTER — Encounter: Payer: Self-pay | Admitting: Family Medicine

## 2022-09-16 ENCOUNTER — Ambulatory Visit: Payer: 59 | Admitting: Family Medicine

## 2022-09-16 VITALS — BP 179/132 | HR 86 | Temp 98.1°F | Resp 18 | Ht 64.0 in | Wt 162.2 lb

## 2022-09-16 DIAGNOSIS — Z13220 Encounter for screening for lipoid disorders: Secondary | ICD-10-CM | POA: Diagnosis not present

## 2022-09-16 DIAGNOSIS — Z13228 Encounter for screening for other metabolic disorders: Secondary | ICD-10-CM

## 2022-09-16 DIAGNOSIS — Z130 Encounter for screening for diseases of the blood and blood-forming organs and certain disorders involving the immune mechanism: Secondary | ICD-10-CM

## 2022-09-16 DIAGNOSIS — Z1329 Encounter for screening for other suspected endocrine disorder: Secondary | ICD-10-CM

## 2022-09-16 DIAGNOSIS — Z0000 Encounter for general adult medical examination without abnormal findings: Secondary | ICD-10-CM

## 2022-09-16 DIAGNOSIS — I10 Essential (primary) hypertension: Secondary | ICD-10-CM

## 2022-09-16 MED ORDER — AMLODIPINE BESYLATE 10 MG PO TABS: 10.0000 mg | ORAL_TABLET | Freq: Every day | ORAL | 0 refills | Status: DC

## 2022-09-16 MED ORDER — PREGABALIN 200 MG PO CAPS: 200.0000 mg | ORAL_CAPSULE | Freq: Two times a day (BID) | ORAL | 0 refills | Status: DC

## 2022-09-16 NOTE — Progress Notes (Signed)
Annual Exam Patients wants MD to look at results from CT scan for COPD.  Patient was refill on Pregabalin.  Patient wants her levels check for lupus

## 2022-09-17 LAB — CBC WITH DIFFERENTIAL/PLATELET
Basophils Absolute: 0 10*3/uL (ref 0.0–0.2)
Basos: 1 %
EOS (ABSOLUTE): 0.1 10*3/uL (ref 0.0–0.4)
Eos: 2 %
Hematocrit: 39.9 % (ref 34.0–46.6)
Hemoglobin: 12.9 g/dL (ref 11.1–15.9)
Immature Grans (Abs): 0 10*3/uL (ref 0.0–0.1)
Immature Granulocytes: 0 %
Lymphocytes Absolute: 1.7 10*3/uL (ref 0.7–3.1)
Lymphs: 25 %
MCH: 28.3 pg (ref 26.6–33.0)
MCHC: 32.3 g/dL (ref 31.5–35.7)
MCV: 88 fL (ref 79–97)
Monocytes Absolute: 0.4 10*3/uL (ref 0.1–0.9)
Monocytes: 6 %
Neutrophils Absolute: 4.5 10*3/uL (ref 1.4–7.0)
Neutrophils: 66 %
Platelets: 166 10*3/uL (ref 150–450)
RBC: 4.56 x10E6/uL (ref 3.77–5.28)
RDW: 12.7 % (ref 11.7–15.4)
WBC: 6.7 10*3/uL (ref 3.4–10.8)

## 2022-09-17 LAB — LIPID PANEL
Chol/HDL Ratio: 2.9 ratio (ref 0.0–4.4)
Cholesterol, Total: 165 mg/dL (ref 100–199)
HDL: 56 mg/dL (ref >39)
LDL Chol Calc (NIH): 93 mg/dL (ref 0–99)
Triglycerides: 83 mg/dL (ref 0–149)
VLDL Cholesterol Cal: 16 mg/dL (ref 5–40)

## 2022-09-17 LAB — CMP14+EGFR
ALT: 7 IU/L (ref 0–32)
AST: 12 IU/L (ref 0–40)
Albumin: 4.1 g/dL (ref 3.8–4.9)
Alkaline Phosphatase: 83 IU/L (ref 44–121)
BUN/Creatinine Ratio: 11 (ref 9–23)
BUN: 9 mg/dL (ref 6–24)
Bilirubin Total: 0.3 mg/dL (ref 0.0–1.2)
CO2: 25 mmol/L (ref 20–29)
Calcium: 9.4 mg/dL (ref 8.7–10.2)
Chloride: 104 mmol/L (ref 96–106)
Creatinine, Ser: 0.84 mg/dL (ref 0.57–1.00)
Globulin, Total: 3.8 g/dL (ref 1.5–4.5)
Glucose: 82 mg/dL (ref 70–99)
Potassium: 4.3 mmol/L (ref 3.5–5.2)
Sodium: 142 mmol/L (ref 134–144)
Total Protein: 7.9 g/dL (ref 6.0–8.5)
eGFR: 83 mL/min/{1.73_m2} (ref >59)

## 2022-09-17 LAB — VITAMIN D 25 HYDROXY (VIT D DEFICIENCY, FRACTURES): Vit D, 25-Hydroxy: 8.9 ng/mL — ABNORMAL LOW (ref 30.0–100.0)

## 2022-09-17 LAB — TSH: TSH: 1.6 u[IU]/mL (ref 0.450–4.500)

## 2022-09-20 ENCOUNTER — Other Ambulatory Visit (HOSPITAL_BASED_OUTPATIENT_CLINIC_OR_DEPARTMENT_OTHER): Payer: Self-pay | Admitting: Family Medicine

## 2022-09-20 ENCOUNTER — Encounter: Payer: Self-pay | Admitting: Family Medicine

## 2022-09-20 DIAGNOSIS — Z1231 Encounter for screening mammogram for malignant neoplasm of breast: Secondary | ICD-10-CM

## 2022-09-20 MED ORDER — VITAMIN D (ERGOCALCIFEROL) 1.25 MG (50000 UNIT) PO CAPS: 50000.0000 [IU] | ORAL_CAPSULE | ORAL | 0 refills | Status: DC

## 2022-09-20 NOTE — Progress Notes (Signed)
Established Patient Office Visit  Subjective    Patient ID: Denise Carter, female    DOB: 1970-02-27  Age: 52 y.o. MRN: [id number]  CC:  Chief Complaint  Patient presents with   Annual Exam    Look at COPD results had a CT Scan.  Refill on pregabalin.  Levels check for her lupus    HPI [name] presents for routine annual exam. Patient denies acute complaints.    Outpatient Encounter Medications as of 09/16/2022  Medication Sig   amLODipine (NORVASC) 10 MG tablet Take 1 tablet (10 mg total) by mouth daily.   albuterol (VENTOLIN HFA) 108 (90 Base) MCG/ACT inhaler Inhale 2 puffs into the lungs every 6 (six) hours as needed for wheezing or shortness of breath.   amLODipine (NORVASC) 5 MG tablet Take 1 tablet (5 mg total) by mouth daily.   aspirin (EC-81 ASPIRIN) 81 MG EC tablet Take 1 tablet (81 mg total) by mouth daily. Swallow whole.   atorvastatin (LIPITOR) 40 MG tablet Take 1 tablet (40 mg total) by mouth daily.   buPROPion (WELLBUTRIN SR) 150 MG 12 hr tablet Take 1 tablet (150 mg total) by mouth 2 (two) times daily.   carvedilol (COREG) 25 MG tablet Take 1 tablet (25 mg total) by mouth 2 (two) times daily.   chlorthalidone (HYGROTON) 25 MG tablet Take 1 tablet (25 mg total) by mouth daily.   cloNIDine (CATAPRES) 0.1 MG tablet Take 1 tablet (0.1 mg total) by mouth 3 (three) times daily.   FLUoxetine (PROZAC) 20 MG tablet Take 1 tablet (20 mg total) by mouth daily.   fluticasone (FLONASE) 50 MCG/ACT nasal spray Place 2 sprays into both nostrils daily.   Fluticasone-Umeclidin-Vilant (TRELEGY ELLIPTA) 100-62.5-25 MCG/ACT AEPB Inhale 1 puff into the lungs daily.   ibuprofen (ADVIL) 200 MG tablet Take 200 mg by mouth every 6 (six) hours as needed.   irbesartan (AVAPRO) 300 MG tablet Take 1 tablet (300 mg total) by mouth daily.   KLS OMPERAZOLE 20 MG TBEC Take 1 tablet by mouth daily.   nicotine (NICODERM CQ) 14 mg/24hr patch Place 1 patch (14 mg total) onto the skin  daily.   pantoprazole (PROTONIX) 20 MG tablet Take 1 tablet (20 mg total) by mouth daily.   pregabalin (LYRICA) 200 MG capsule Take 1 capsule (200 mg total) by mouth 2 (two) times daily.   spironolactone (ALDACTONE) 100 MG tablet Take 2 tablets (200 mg total) by mouth daily.   tamsulosin (FLOMAX) 0.4 MG CAPS capsule Take 1 capsule (0.4 mg total) by mouth daily.   [DISCONTINUED] pregabalin (LYRICA) 200 MG capsule Take 1 capsule (200 mg total) by mouth 2 (two) times daily.   No facility-administered encounter medications on file as of 09/16/2022.    Past Medical History:  Diagnosis Date   Atypical chest pain 05/23/2019   Essential hypertension 05/23/2019   Hypertension    Lupus (HCC)    OSA (obstructive sleep apnea) 11/19/2019   Resistant hypertension 05/23/2019   Shortness of breath 05/23/2019   Snoring 05/23/2019   Thyroid disease    Varicose veins of left lower extremity 01/26/2020    Past Surgical History:  Procedure Laterality Date   ABDOMINAL HYSTERECTOMY      Family History  Problem Relation Age of Onset   Hypertension Mother    Breast cancer Maternal Aunt 13 - 49   Hypertension Maternal Grandmother    Lupus Maternal Grandmother    Heart failure Maternal Grandmother    Stroke  Maternal Grandmother    CAD Maternal Grandmother    Adrenal disorder Neg Hx     Social History   Socioeconomic History   Marital status: Married    Spouse name: Not on file   Number of children: Not on file   Years of education: Not on file   Highest education level: Not on file  Occupational History   Not on file  Tobacco Use   Smoking status: Every Day    Current packs/day: 1.00    Average packs/day: 1 pack/day for 31.7 years (31.7 ttl pk-yrs)    Types: Cigarettes    Start date: 01/26/1991   Smokeless tobacco: Never   Tobacco comments:    Patient is participating in health coaching for smoking cessation.   Vaping Use   Vaping status: Never Used  Substance and Sexual Activity    Alcohol use: No   Drug use: No   Sexual activity: Yes    Birth control/protection: None  Other Topics Concern   Not on file  Social History Narrative   Right handed      Highest level of edu- 12th grade      7 children      Apartment- 2nd floor   Social Determinants of Health   Financial Resource Strain: High Risk (11/28/2019)   Overall Financial Resource Strain (CARDIA)    Difficulty of Paying Living Expenses: Very hard  Food Insecurity: No Food Insecurity (11/28/2019)   Hunger Vital Sign    Worried About Running Out of Food in the Last Year: Never true    Ran Out of Food in the Last Year: Never true  Transportation Needs: No Transportation Needs (11/28/2019)   PRAPARE - Administrator, Civil Service (Medical): No    Lack of Transportation (Non-Medical): No  Physical Activity: Inactive (05/23/2019)   Exercise Vital Sign    Days of Exercise per Week: 0 days    Minutes of Exercise per Session: 0 min  Stress: Stress Concern Present (03/05/2020)   [employer] of Occupational Health - Occupational Stress Questionnaire    Feeling of Stress : Rather much  Social Connections: Not on file  Intimate Partner Violence: Not on file    Review of Systems  All other systems reviewed and are negative.       Objective    BP (!) 179/132 (BP Location: Right Arm, Patient Position: Sitting, Cuff Size: Normal)   Pulse 86   Temp 98.1 F (36.7 C) (Oral)   Resp 18   Ht 5\' 4"  (1.626 m)   Wt 162 lb 3.2 oz (73.6 kg)   SpO2 98%   BMI 27.84 kg/m   Physical Exam Vitals and nursing note reviewed.  Constitutional:      General: She is not in acute distress. HENT:     Head: Normocephalic and atraumatic.     Right Ear: Tympanic membrane, ear canal and external ear normal.     Left Ear: Tympanic membrane, ear canal and external ear normal.     Nose: Nose normal.     Mouth/Throat:     Mouth: Mucous membranes are moist.     Pharynx: Oropharynx is clear.  Eyes:      Conjunctiva/sclera: Conjunctivae normal.     Pupils: Pupils are equal, round, and reactive to light.  Neck:     Thyroid: No thyromegaly.  Cardiovascular:     Rate and Rhythm: Normal rate and regular rhythm.     Heart sounds: Normal heart sounds.  No murmur heard. Pulmonary:     Effort: Pulmonary effort is normal. No respiratory distress.     Breath sounds: Normal breath sounds.  Abdominal:     General: There is no distension.     Palpations: Abdomen is soft. There is no mass.     Tenderness: There is no abdominal tenderness.  Musculoskeletal:        General: Normal range of motion.     Cervical back: Normal range of motion and neck supple.  Skin:    General: Skin is warm and dry.  Neurological:     General: No focal deficit present.     Mental Status: She is alert and oriented to person, place, and time.  Psychiatric:        Mood and Affect: Mood normal.        Behavior: Behavior normal.         Assessment & Plan:   1. Annual physical exam  - CMP14+EGFR  2. Screening for deficiency anemia  - CBC with Differential  3. Screening for lipid disorders  - Lipid Panel  4. Screening for endocrine/metabolic/immunity disorders  - Vitamin D, 25-hydroxy - TSH  5. Uncontrolled hypertension Elevated readings. Amlodipine 10 mg added to regimen.     No follow-ups on file.   Tommie Raymond, MD

## 2022-09-28 ENCOUNTER — Encounter: Payer: Self-pay | Admitting: Pharmacist

## 2022-09-30 ENCOUNTER — Ambulatory Visit (INDEPENDENT_AMBULATORY_CARE_PROVIDER_SITE_OTHER): Payer: 59 | Admitting: Family Medicine

## 2022-09-30 ENCOUNTER — Encounter: Payer: Self-pay | Admitting: Family Medicine

## 2022-09-30 VITALS — BP 124/75 | HR 75 | Temp 98.1°F | Resp 16 | Wt 162.0 lb

## 2022-09-30 DIAGNOSIS — I10 Essential (primary) hypertension: Secondary | ICD-10-CM | POA: Diagnosis not present

## 2022-09-30 DIAGNOSIS — F17200 Nicotine dependence, unspecified, uncomplicated: Secondary | ICD-10-CM

## 2022-09-30 NOTE — Progress Notes (Signed)
Established Patient Office Visit  Subjective    Patient ID: Denise Carter, female    DOB: 04-03-1970  Age: 52 y.o. MRN: [id number]  CC:  Chief Complaint  Patient presents with   Hypertension    HPI Denise Carter presents for follow up of hypertension. Patient denies acute complaints or concerns.  Outpatient Encounter Medications as of 09/30/2022  Medication Sig   albuterol (VENTOLIN HFA) 108 (90 Base) MCG/ACT inhaler Inhale 2 puffs into the lungs every 6 (six) hours as needed for wheezing or shortness of breath.   amLODipine (NORVASC) 10 MG tablet Take 1 tablet (10 mg total) by mouth daily.   aspirin (EC-81 ASPIRIN) 81 MG EC tablet Take 1 tablet (81 mg total) by mouth daily. Swallow whole.   atorvastatin (LIPITOR) 40 MG tablet Take 1 tablet (40 mg total) by mouth daily.   buPROPion (WELLBUTRIN SR) 150 MG 12 hr tablet Take 1 tablet (150 mg total) by mouth 2 (two) times daily.   chlorthalidone (HYGROTON) 25 MG tablet Take 1 tablet (25 mg total) by mouth daily.   cloNIDine (CATAPRES) 0.1 MG tablet Take 1 tablet (0.1 mg total) by mouth 3 (three) times daily.   FLUoxetine (PROZAC) 20 MG tablet Take 1 tablet (20 mg total) by mouth daily.   fluticasone (FLONASE) 50 MCG/ACT nasal spray Place 2 sprays into both nostrils daily.   Fluticasone-Umeclidin-Vilant (TRELEGY ELLIPTA) 100-62.5-25 MCG/ACT AEPB Inhale 1 puff into the lungs daily.   hydroxychloroquine (PLAQUENIL) 200 MG tablet hydroxychloroquine 200 mg tablet   ibuprofen (ADVIL) 200 MG tablet Take 200 mg by mouth every 6 (six) hours as needed.   irbesartan (AVAPRO) 300 MG tablet Take 1 tablet (300 mg total) by mouth daily.   KLS OMPERAZOLE 20 MG TBEC Take 1 tablet by mouth daily.   nicotine (NICODERM CQ) 14 mg/24hr patch Place 1 patch (14 mg total) onto the skin daily.   pantoprazole (PROTONIX) 20 MG tablet Take 1 tablet (20 mg total) by mouth daily.   pregabalin (LYRICA) 200 MG capsule Take 1 capsule (200 mg total) by mouth 2  (two) times daily.   spironolactone (ALDACTONE) 100 MG tablet Take 2 tablets (200 mg total) by mouth daily.   tamsulosin (FLOMAX) 0.4 MG CAPS capsule Take 1 capsule (0.4 mg total) by mouth daily.   Vitamin D, Ergocalciferol, (DRISDOL) 1.25 MG (50000 UNIT) CAPS capsule Take 1 capsule (50,000 Units total) by mouth every 7 (seven) days.   [DISCONTINUED] amLODipine (NORVASC) 5 MG tablet Take 1 tablet (5 mg total) by mouth daily.   carvedilol (COREG) 25 MG tablet Take 1 tablet (25 mg total) by mouth 2 (two) times daily.   No facility-administered encounter medications on file as of 09/30/2022.    Past Medical History:  Diagnosis Date   Atypical chest pain 05/23/2019   Essential hypertension 05/23/2019   Hypertension    Lupus (HCC)    OSA (obstructive sleep apnea) 11/19/2019   Resistant hypertension 05/23/2019   Shortness of breath 05/23/2019   Snoring 05/23/2019   Thyroid disease    Varicose veins of left lower extremity 01/26/2020    Past Surgical History:  Procedure Laterality Date   ABDOMINAL HYSTERECTOMY      Family History  Problem Relation Age of Onset   Hypertension Mother    Breast cancer Maternal Aunt 71 - 49   Hypertension Maternal Grandmother    Lupus Maternal Grandmother    Heart failure Maternal Grandmother    Stroke Maternal Grandmother  CAD Maternal Grandmother    Adrenal disorder Neg Hx     Social History   Socioeconomic History   Marital status: Married    Spouse name: Not on file   Number of children: Not on file   Years of education: Not on file   Highest education level: GED or equivalent  Occupational History   Not on file  Tobacco Use   Smoking status: Every Day    Current packs/day: 1.00    Average packs/day: 1 pack/day for 31.7 years (31.7 ttl pk-yrs)    Types: Cigarettes    Start date: 01/26/1991   Smokeless tobacco: Never   Tobacco comments:    Patient is participating in health coaching for smoking cessation.   Vaping Use   Vaping  status: Never Used  Substance and Sexual Activity   Alcohol use: No   Drug use: No   Sexual activity: Yes    Birth control/protection: None  Other Topics Concern   Not on file  Social History Narrative   Right handed      Highest level of edu- 12th grade      7 children      Apartment- 2nd floor   Social Determinants of Health   Financial Resource Strain: Medium Risk (09/26/2022)   Overall Financial Resource Strain (CARDIA)    Difficulty of Paying Living Expenses: Somewhat hard  Food Insecurity: Food Insecurity Present (09/26/2022)   Hunger Vital Sign    Worried About Running Out of Food in the Last Year: Sometimes true    Ran Out of Food in the Last Year: Sometimes true  Transportation Needs: Unmet Transportation Needs (09/26/2022)   PRAPARE - Transportation    Lack of Transportation (Medical): Yes    Lack of Transportation (Non-Medical): Yes  Physical Activity: Unknown (09/26/2022)   Exercise Vital Sign    Days of Exercise per Week: 0 days    Minutes of Exercise per Session: Not on file  Stress: Stress Concern Present (09/26/2022)   [employer] of Occupational Health - Occupational Stress Questionnaire    Feeling of Stress : Rather much  Social Connections: Moderately Isolated (09/26/2022)   Social Connection and Isolation Panel [NHANES]    Frequency of Communication with Friends and Family: More than three times a week    Frequency of Social Gatherings with Friends and Family: Never    Attends Religious Services: Never    Database administrator or Organizations: No    Attends Engineer, structural: Not on file    Marital Status: Married  Catering manager Violence: Not on file    Review of Systems  All other systems reviewed and are negative.       Objective    BP 124/75   Pulse 75   Temp 98.1 F (36.7 C) (Oral)   Resp 16   Wt 162 lb (73.5 kg)   SpO2 98%   BMI 27.81 kg/m   Physical Exam Vitals and nursing note reviewed.  Constitutional:       General: She is not in acute distress. Cardiovascular:     Rate and Rhythm: Normal rate and regular rhythm.  Pulmonary:     Effort: Pulmonary effort is normal.     Breath sounds: Normal breath sounds.  Abdominal:     Palpations: Abdomen is soft.     Tenderness: There is no abdominal tenderness.  Neurological:     General: No focal deficit present.     Mental Status: She is alert and  oriented to person, place, and time.         Assessment & Plan:  1. Essential hypertension Much improved with present management. Continue   2. Smoking Discussed reduction/cessation   Return in about 3 months (around 12/30/2022) for follow up.   Tommie Raymond, MD

## 2022-09-30 NOTE — Progress Notes (Signed)
Patient is here for 2wk follow-up BP Patent has uncontrolled hypertension Provider is  aware of readings  

## 2022-10-01 ENCOUNTER — Encounter: Payer: Self-pay | Admitting: Family Medicine

## 2022-10-20 ENCOUNTER — Encounter: Payer: Self-pay | Admitting: Pulmonary Disease

## 2022-10-26 ENCOUNTER — Encounter (HOSPITAL_BASED_OUTPATIENT_CLINIC_OR_DEPARTMENT_OTHER): Payer: Self-pay | Admitting: Family

## 2022-10-26 ENCOUNTER — Ambulatory Visit (HOSPITAL_BASED_OUTPATIENT_CLINIC_OR_DEPARTMENT_OTHER): Payer: 59 | Admitting: Family

## 2022-10-26 VITALS — BP 164/100 | HR 84 | Ht 64.0 in | Wt 167.0 lb

## 2022-10-26 DIAGNOSIS — G4733 Obstructive sleep apnea (adult) (pediatric): Secondary | ICD-10-CM

## 2022-10-26 DIAGNOSIS — I1A0 Resistant hypertension: Secondary | ICD-10-CM | POA: Diagnosis not present

## 2022-10-26 DIAGNOSIS — E782 Mixed hyperlipidemia: Secondary | ICD-10-CM | POA: Diagnosis not present

## 2022-10-26 DIAGNOSIS — Z720 Tobacco use: Secondary | ICD-10-CM | POA: Diagnosis not present

## 2022-10-26 MED ORDER — CLONIDINE 0.1 MG/24HR TD PTWK: 0.1000 mg | MEDICATED_PATCH | TRANSDERMAL | 12 refills | Status: DC

## 2022-10-26 NOTE — Patient Instructions (Addendum)
Medication Instructions:  Your physician has recommended you make the following change in your medication:  STOP Clonidine tablet  START Clonidine 0.1 mg patch once per week    Follow-Up:  12/12 at 1:55pm with Gillian Shields, NP    Special Instructions:   Pursed Lip Breathing Pursed lip breathing is a technique to relieve the feeling of being short of breath.  Being short of breath can make you tense and anxious. Before you start this breathing exercise, take a minute to relax your shoulders and close your eyes. Then: Start the exercise by closing your mouth. Breathe in through your nose, taking a normal breath. You can do this at your normal rate of breathing. If you feel you are not getting enough air, breathe in while slowly counting to 2 or 3. Pucker (purse) your lips as if you were going to whistle. Gently tighten the muscles of your abdomenor press on your abdomen to help push the air out. Breathe out slowly through your pursed lips. Take at least twice as long to breathe out as it takes you to breathe in. Make sure that you breathe out all of the air, but do not force air out.

## 2022-10-26 NOTE — Progress Notes (Unsigned)
[facility] Initial Assessment:    Date:  10/26/2022   ID:  Denise Carter, DOB May 22, 1970, MRN [id number]  PCP:  Georganna Skeans, MD  Cardiologist:  Chilton Si, MD  Nephrologist:  Referring MD: Georganna Skeans, MD   CC: Hypertension  History of Present Illness:    Denise Carter is a 52 y.o. female with a hx of  with a hx of RA, Sjorgren's, SLE, resistant hypertension, OSA, tobacco use, varicose veins, hyperlipidemia  last seen 03/04/21.   Hypertension initially diagnosed in 2015 at time of SLE diagnosis. BP never well controlled. Previously seen by Dr. Eden Emms but has transitioned to Dr. Duke Salvia. Renal artery dopplers 2019 normal. Plasma catecholamines and metanephrines negative. Coronary CTA 05/2019 with normal coronary arteries. Echo 05/2019 normal LVEF 60-65%, gr2DD. Renin and aldosterone normal for hyperaldosteronism but concerning for Cushing syndrome. Referred to endocrinology who did not feel her labs were consistent with Cushing. Sleep study showed OSA recommended for CPAP. Multiple previous medication changes and intolerances followed by Dr. Duke Salvia in [facility] and pharmacy team.    She was seen by primary care 02/06/21 with BP 168/133 and antihypertensive medications resumed including Amlodipine 5mg  QD, Chlorthalidone 25mg  QD, Irbesartan 300mg  qD, Spironolactone 200mg  QD. Confirmed with pharmacy that she picked up 90 day supply on 02/06/21.    She was seen 03/04/20 then lost to follow up for 1 year due to loss of insurance. Was seen to re-establish care 03/04/21. 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 BP at home 150-160 with arm cuff though elevated in clinic. Referred to ENT for inspir as did not tolerate CPAP due to fear of tube strangling her.  Coreg added to regimen and echo ordered due to dyspnea. Echo 04/2021 LVEF 60-65%, no rWMA, mild LVH, gr1DD, mild AI. She was referred to pulmonology.    Seen 05/18/21 for hypertension follow up.  BP not at goal, Carvedilol increased. Referred to pulmonology for dyspnea. She had upcoming visit with ENT to discuss Inspire device of OSA. CT lung cancer screening and 24h blood pressure monitor ordered but not performed.    She presents today for follow up independently. Checks her blood pressure at home in the evening with SBP 150-160s. She describes "tension" headaches where her ponytail is and drink a cup of coffee which improves it. Since transition of inhalers tells me her breathing is about the same both sitting still and with activity. Tells me pulmonology told her no to inspire device. She is not interested in CPAP. Takes all of her medications in the morning - taking Carvedilol only daily. She is only taking 100mg  of Spironolactone rather than 200mg  daily. She notes she does feel better when she takes Irbesartan. Takes her medicine around 0430-0500 before leaving for work.   Seen 03/2022 BP 198/114. Clonidine increased to 0.1mg  TID. Amlodipine, Coreg, Chlorthalidone, Irbesartan, Spironolactone continued  RDN ordered but not covered by insurance.   Had cold 1-2 weeks ago. Still with dyspnea at rest. Using her Trelegy regularly. She is using PRN Albuterol every toher day. No cough, wheeze.   BP at home has been labile. At most 179/112 but has not been that high in some time. Most often at home SBP 150s. Taking medications   AM - Chlorthalidone, Coreg?, Spironolactone, Amlodipine PM - Coreg, Irbesartan  She is taking Clonidine just in the evening ***. She has never taking Clonidine 0.1mg  TID as had difficulty remembering the evening dose. Notes difficulty remembering her various medications.   Attributes  elevated BP readings to stress.  She h  Has been taking lots of ***  Has not been on plaquenil or lyricsa for 1.5 years Frustrated...  has been dsimsised by multiple rheumatologist  Nicotine patch did not work. Is motivated to quite smoking.  Attributes low vitamin D to low energy  level  Previous intolerances: Labetalol - headaches HCTZ - headaches Valsartan - diarrhea Amlodipine causes headaches at 10 mg, but tolerates 5mg  Hydralazine - headaches   Past Medical History:  Diagnosis Date   Atypical chest pain 05/23/2019   Essential hypertension 05/23/2019   Hypertension    Lupus    OSA (obstructive sleep apnea) 11/19/2019   Resistant hypertension 05/23/2019   Shortness of breath 05/23/2019   Snoring 05/23/2019   Thyroid disease    Varicose veins of left lower extremity 01/26/2020    Past Surgical History:  Procedure Laterality Date   ABDOMINAL HYSTERECTOMY      Current Medications: Current Meds  Medication Sig   albuterol (VENTOLIN HFA) 108 (90 Base) MCG/ACT inhaler Inhale 2 puffs into the lungs every 6 (six) hours as needed for wheezing or shortness of breath.   amLODipine (NORVASC) 10 MG tablet Take 1 tablet (10 mg total) by mouth daily.   aspirin (EC-81 ASPIRIN) 81 MG EC tablet Take 1 tablet (81 mg total) by mouth daily. Swallow whole.   atorvastatin (LIPITOR) 40 MG tablet Take 1 tablet (40 mg total) by mouth daily.   carvedilol (COREG) 25 MG tablet Take 1 tablet (25 mg total) by mouth 2 (two) times daily.   chlorthalidone (HYGROTON) 25 MG tablet Take 1 tablet (25 mg total) by mouth daily.   cloNIDine (CATAPRES) 0.1 MG tablet Take 1 tablet (0.1 mg total) by mouth 3 (three) times daily.   FLUoxetine (PROZAC) 20 MG tablet Take 1 tablet (20 mg total) by mouth daily.   fluticasone (FLONASE) 50 MCG/ACT nasal spray Place 2 sprays into both nostrils daily.   Fluticasone-Umeclidin-Vilant (TRELEGY ELLIPTA) 100-62.5-25 MCG/ACT AEPB Inhale 1 puff into the lungs daily.   ibuprofen (ADVIL) 200 MG tablet Take 200 mg by mouth every 6 (six) hours as needed.   irbesartan (AVAPRO) 300 MG tablet Take 1 tablet (300 mg total) by mouth daily.   KLS OMPERAZOLE 20 MG TBEC Take 1 tablet by mouth daily.   nicotine (NICODERM CQ) 14 mg/24hr patch Place 1 patch (14 mg total)  onto the skin daily.   pantoprazole (PROTONIX) 20 MG tablet Take 1 tablet (20 mg total) by mouth daily.   spironolactone (ALDACTONE) 100 MG tablet Take 2 tablets (200 mg total) by mouth daily.   tamsulosin (FLOMAX) 0.4 MG CAPS capsule Take 1 capsule (0.4 mg total) by mouth daily.   Vitamin D, Ergocalciferol, (DRISDOL) 1.25 MG (50000 UNIT) CAPS capsule Take 1 capsule (50,000 Units total) by mouth every 7 (seven) days.     Allergies:   Labetalol   Social History   Socioeconomic History   Marital status: Married    Spouse name: Not on file   Number of children: Not on file   Years of education: Not on file   Highest education level: GED or equivalent  Occupational History   Not on file  Tobacco Use   Smoking status: Every Day    Current packs/day: 1.00    Average packs/day: 1 pack/day for 31.7 years (31.7 ttl pk-yrs)    Types: Cigarettes    Start date: 01/26/1991   Smokeless tobacco: Never   Tobacco comments:    Patient  is participating in health coaching for smoking cessation.   Vaping Use   Vaping status: Never Used  Substance and Sexual Activity   Alcohol use: No   Drug use: No   Sexual activity: Yes    Birth control/protection: None  Other Topics Concern   Not on file  Social History Narrative   Right handed      Highest level of edu- 12th grade      7 children      Apartment- 2nd floor   Social Determinants of Health   Financial Resource Strain: Medium Risk (09/26/2022)   Overall Financial Resource Strain (CARDIA)    Difficulty of Paying Living Expenses: Somewhat hard  Food Insecurity: Food Insecurity Present (09/26/2022)   Hunger Vital Sign    Worried About Running Out of Food in the Last Year: Sometimes true    Ran Out of Food in the Last Year: Sometimes true  Transportation Needs: Unmet Transportation Needs (09/26/2022)   PRAPARE - Transportation    Lack of Transportation (Medical): Yes    Lack of Transportation (Non-Medical): Yes  Physical Activity: Unknown  (09/26/2022)   Exercise Vital Sign    Days of Exercise per Week: 0 days    Minutes of Exercise per Session: Not on file  Stress: Stress Concern Present (09/26/2022)   [employer] of Occupational Health - Occupational Stress Questionnaire    Feeling of Stress : Rather much  Social Connections: Moderately Isolated (09/26/2022)   Social Connection and Isolation Panel [NHANES]    Frequency of Communication with Friends and Family: More than three times a week    Frequency of Social Gatherings with Friends and Family: Never    Attends Religious Services: Never    Diplomatic Services operational officer: No    Attends Engineer, structural: Not on file    Marital Status: Married     Family History: The patient's ***family history includes Breast cancer (age of onset: 70 - 67) in her maternal aunt; CAD in her maternal grandmother; Heart failure in her maternal grandmother; Hypertension in her maternal grandmother and mother; Lupus in her maternal grandmother; Stroke in her maternal grandmother. There is no history of Adrenal disorder.  ROS:   Please see the history of present illness.    *** All other systems reviewed and are negative.  EKGs/Labs/Other Studies Reviewed:    EKG:  EKG is *** ordered today.  The ekg ordered today demonstrates ***  Recent Labs: 09/16/2022: ALT 7; BUN 9; Creatinine, Ser 0.84; Hemoglobin 12.9; Platelets 166; Potassium 4.3; Sodium 142; TSH 1.600   Recent Lipid Panel    Component Value Date/Time   CHOL 165 09/16/2022 1112   TRIG 83 09/16/2022 1112   HDL 56 09/16/2022 1112   CHOLHDL 2.9 09/16/2022 1112   LDLCALC 93 09/16/2022 1112    Physical Exam:   VS:  BP (!) 156/98   Pulse 84   Ht 5\' 4"  (1.626 m)   Wt 167 lb (75.8 kg)   SpO2 98%   BMI 28.67 kg/m  , BMI Body mass index is 28.67 kg/m. GENERAL:  Well appearing HEENT: Pupils equal round and reactive, fundi not visualized, oral mucosa unremarkable NECK:  No jugular venous distention,  waveform within normal limits, carotid upstroke brisk and symmetric, no bruits, no thyromegaly LYMPHATICS:  No cervical adenopathy LUNGS:  Clear to auscultation bilaterally HEART:  RRR.  PMI not displaced or sustained,S1 and S2 within normal limits, no S3, no S4, no clicks, no rubs, ***  murmurs ABD:  Flat, positive bowel sounds normal in frequency in pitch, no bruits, no rebound, no guarding, no midline pulsatile mass, no hepatomegaly, no splenomegaly EXT:  2 plus pulses throughout, no edema, no cyanosis no clubbing SKIN:  No rashes no nodules NEURO:  Cranial nerves II through XII grossly intact, motor grossly intact throughout PSYCH:  Cognitively intact, oriented to person place and time   ASSESSMENT/PLAN:    No problem-specific Assessment & Plan notes found for this encounter. SOB -  Echo 04/2021 normal LVEF, r1DD, normal PASP, mild AI. Seen by pulmonology 09/16/21 switched from Trelegy to [facility], encouraged to stop smoking, follow up in 6 months.     OSA - Declines CPAP trial due to fear of being strangled. Addendum 11/11/21: discussed with Dr. Mayford Knife. Will need AHI Of >15 to qualify for Inspire. As last sleep study >2 years ago, plan for in lab PSG to reassess AHI to determine if she is candidate for Inspire.    Tobacco use - Reports Wellbutrin and gradual cessation have not worked. Previously declined referral to psychology, Dr. Georgiann Mohs, for assistance with cessation. Smoking 1 PPD. Though has been cutting back over last 2 weeks.  Smoking cessation encouraged. Recommend utilization of 1800QUITNOW. 04/2021 CT Lung RADS2 - repeat CT 04/2022.   Resistant HTN - Not at goal. Taking Coreg only once per day and taking only 100mg  of Spironolactone instead of 200mg  prescribed dose.   She is frustrated by number of medications - discussed familial element as well as untreated OSA contributory. Multiple prior medication intolerances detailed above.  Hesitant to utilize Imdur as she has had headache  with multiple her medications.  Increase Spironolactone to 200mg  QD as prescribed. Take Carvedilol 25mg  BID as prescribed. BMP in 1-2 weeks.  Secondary hypertension workup: 12/2017 normal renal arteries 05/2020 normal thyroid function OSA - declines CPAP, upcoming appt June with ENT for consideration of Inspire device   HLD - Continue Atorvastatin. Management per PCP.    Screening for Secondary Hypertension: { Click here to document screening for secondary causes of HTN  :[id number]}    Relevant Labs/Studies:    Latest Ref Rng & Units 09/16/2022   11:12 AM 02/16/2022   11:42 AM 03/04/2021    9:39 AM  Basic Labs  Sodium 134 - 144 mmol/L 142  144  141   Potassium 3.5 - 5.2 mmol/L 4.3  3.7  4.7   Creatinine 0.57 - 1.00 mg/dL 0.27  2.53  6.64        Latest Ref Rng & Units 09/16/2022   11:12 AM 06/05/2020    7:56 AM  Thyroid   TSH 0.450 - 4.500 uIU/mL 1.600  1.82        Latest Ref Rng & Units 08/03/2019    2:01 PM 07/23/2019    2:04 PM 05/23/2019    9:25 AM  Renin/Aldosterone   Aldosterone *** ng/dL 3  1  3.1   Renin 4.034 - 5.380 ng/mL/hr   <0.167   Aldos/Renin Ratio 0.0 - 30.0   >18.6        Latest Ref Rng & Units 07/23/2019    2:04 PM 05/23/2019    9:25 AM  Metanephrines/Catecholamines   Epinephrine pg/mL <20    Norepinephrine pg/mL 319    Dopamine pg/mL <10    Metanephrines <=57 pg/mL 56  51.8   Normetanephrines  <=148 pg/mL 84  103.3           01/23/2018   12:26 PM  Renovascular  Renal Artery [place] Completed Yes        she consents to be monitored in our remote patient monitoring program through [employer].  she will track his blood pressure twice daily and understands that these trends will help [place] to adjust her medications as needed prior to his next appointment.  she *** interested in enrolling in the PREP exercise and nutrition program through the [facility].     Disposition:    FU with MD/PharmD in {gen number 1-61:[id number]} {Days to years:10300}    Medication  Adjustments/Labs and Tests Ordered: Current medicines are reviewed at length with the patient today.  Concerns regarding medicines are outlined above.  Orders Placed This Encounter  Procedures   EKG 12-Lead   No orders of the defined types were placed in this encounter.    Signed, Alver Sorrow, NP  10/26/2022 10:58 AM    [facility]

## 2022-11-06 ENCOUNTER — Ambulatory Visit (HOSPITAL_BASED_OUTPATIENT_CLINIC_OR_DEPARTMENT_OTHER)
Admission: RE | Admit: 2022-11-06 | Discharge: 2022-11-06 | Disposition: A | Discharge status: Home / Self Care | Payer: 59 | Source: Ambulatory Visit | Attending: Family Medicine | Admitting: Family Medicine

## 2022-11-06 DIAGNOSIS — Z1231 Encounter for screening mammogram for malignant neoplasm of breast: Secondary | ICD-10-CM | POA: Diagnosis present

## 2022-11-10 ENCOUNTER — Encounter (HOSPITAL_BASED_OUTPATIENT_CLINIC_OR_DEPARTMENT_OTHER): Payer: Self-pay

## 2022-11-16 ENCOUNTER — Encounter: Payer: Self-pay | Admitting: Family Medicine

## 2022-11-22 ENCOUNTER — Encounter: Payer: Self-pay | Admitting: Family Medicine

## 2022-12-02 ENCOUNTER — Ambulatory Visit: Payer: 59 | Admitting: Pulmonary Disease

## 2022-12-02 VITALS — BP 150/110 | HR 86 | Ht 64.0 in | Wt 167.2 lb

## 2022-12-02 DIAGNOSIS — R0609 Other forms of dyspnea: Secondary | ICD-10-CM | POA: Diagnosis not present

## 2022-12-02 DIAGNOSIS — R0602 Shortness of breath: Secondary | ICD-10-CM | POA: Diagnosis not present

## 2022-12-02 DIAGNOSIS — J432 Centrilobular emphysema: Secondary | ICD-10-CM

## 2022-12-02 MED ORDER — PREDNISONE 20 MG PO TABS: 40.0000 mg | ORAL_TABLET | Freq: Every day | ORAL | 0 refills | Status: AC

## 2022-12-02 MED ORDER — VARENICLINE TARTRATE 1 MG PO TABS: 1.0000 mg | ORAL_TABLET | Freq: Two times a day (BID) | ORAL | 2 refills | Status: DC

## 2022-12-02 MED ORDER — VARENICLINE TARTRATE 0.5 MG PO TABS: 0.5000 mg | ORAL_TABLET | Freq: Two times a day (BID) | ORAL | 0 refills | Status: DC

## 2022-12-02 MED ORDER — AZITHROMYCIN 250 MG PO TABS: ORAL_TABLET | ORAL | 0 refills | Status: DC

## 2022-12-02 MED ORDER — BREZTRI AEROSPHERE 160-9-4.8 MCG/ACT IN AERO: 2.0000 | INHALATION_SPRAY | Freq: Two times a day (BID) | RESPIRATORY_TRACT | 3 refills | Status: DC

## 2022-12-02 NOTE — Patient Instructions (Signed)
Provide sample of Breztri  Prescription for Markus Daft will be sent to pharmacy  Prescription for azithromycin and prednisone  Work on quitting smoking  Prescription for Chantix will be sent to pharmacy  Follow-up in 8 to 12 weeks

## 2022-12-02 NOTE — Progress Notes (Signed)
Denise Carter    [id number]    08-Oct-1970  Primary Care Physician:Wilson, Lauris Poag, MD  Referring Physician: Georganna Skeans, MD [address] [place],  [place] [address]  Chief complaint:   Patient is being seen for shortness of breath  HPI:  Has had some exacerbation of symptoms  Continues to smoke about a pack of cigarettes a day Short of breath with activity Cough with mucus production Continues to be short of breath with activity, has been using Trelegy regularly but feels that breathing is still quite bad okay  Continues to smoke about a pack a day but was very receptive to discussions about quitting smoking  History of mild obstructive sleep apnea Sjogren's syndrome Chronic fatigue syndrome Polyneuropathy due to lupus History of rheumatoid arthritis Psoriatic arthritis Polyclonal gammopathy  Recent sleep study does reveal mild obstructive sleep apnea  No pertinent occupational history  Recent CT with lung nodules and emphysema  Outpatient Encounter Medications as of 12/02/2022  Medication Sig   albuterol (VENTOLIN HFA) 108 (90 Base) MCG/ACT inhaler Inhale 2 puffs into the lungs every 6 (six) hours as needed for wheezing or shortness of breath.   amLODipine (NORVASC) 10 MG tablet Take 1 tablet (10 mg total) by mouth daily.   aspirin (EC-81 ASPIRIN) 81 MG EC tablet Take 1 tablet (81 mg total) by mouth daily. Swallow whole.   atorvastatin (LIPITOR) 40 MG tablet Take 1 tablet (40 mg total) by mouth daily.   chlorthalidone (HYGROTON) 25 MG tablet Take 1 tablet (25 mg total) by mouth daily.   cloNIDine (CATAPRES - DOSED IN MG/24 HR) 0.1 mg/24hr patch Place 1 patch (0.1 mg total) onto the skin once a week.   FLUoxetine (PROZAC) 20 MG tablet Take 1 tablet (20 mg total) by mouth daily.   fluticasone (FLONASE) 50 MCG/ACT nasal spray Place 2 sprays into both nostrils daily.   Fluticasone-Umeclidin-Vilant (TRELEGY ELLIPTA) 100-62.5-25 MCG/ACT AEPB  Inhale 1 puff into the lungs daily.   ibuprofen (ADVIL) 200 MG tablet Take 200 mg by mouth every 6 (six) hours as needed.   irbesartan (AVAPRO) 300 MG tablet Take 1 tablet (300 mg total) by mouth daily.   KLS OMPERAZOLE 20 MG TBEC Take 1 tablet by mouth daily.   pantoprazole (PROTONIX) 20 MG tablet Take 1 tablet (20 mg total) by mouth daily.   pregabalin (LYRICA) 200 MG capsule Take 1 capsule (200 mg total) by mouth 2 (two) times daily.   spironolactone (ALDACTONE) 100 MG tablet Take 2 tablets (200 mg total) by mouth daily.   tamsulosin (FLOMAX) 0.4 MG CAPS capsule Take 1 capsule (0.4 mg total) by mouth daily.   Vitamin D, Ergocalciferol, (DRISDOL) 1.25 MG (50000 UNIT) CAPS capsule Take 1 capsule (50,000 Units total) by mouth every 7 (seven) days.   carvedilol (COREG) 25 MG tablet Take 1 tablet (25 mg total) by mouth 2 (two) times daily.   No facility-administered encounter medications on file as of 12/02/2022.    Allergies as of 12/02/2022 - Review Complete 12/02/2022  Allergen Reaction Noted   Labetalol Anxiety 10/25/2018    Past Medical History:  Diagnosis Date   Atypical chest pain 05/23/2019   Essential hypertension 05/23/2019   Hypertension    Lupus    OSA (obstructive sleep apnea) 11/19/2019   Resistant hypertension 05/23/2019   Shortness of breath 05/23/2019   Snoring 05/23/2019   Thyroid disease    Varicose veins of left lower extremity 01/26/2020  Past Surgical History:  Procedure Laterality Date   ABDOMINAL HYSTERECTOMY      Family History  Problem Relation Age of Onset   Hypertension Mother    Breast cancer Maternal Aunt 30 - 49   Hypertension Maternal Grandmother    Lupus Maternal Grandmother    Heart failure Maternal Grandmother    Stroke Maternal Grandmother    CAD Maternal Grandmother    Adrenal disorder Neg Hx     Social History   Socioeconomic History   Marital status: Married    Spouse name: Not on file   Number of children: Not on file    Years of education: Not on file   Highest education level: GED or equivalent  Occupational History   Not on file  Tobacco Use   Smoking status: Every Day    Current packs/day: 1.00    Average packs/day: 1 pack/day for 31.8 years (31.8 ttl pk-yrs)    Types: Cigarettes    Start date: 01/26/1991   Smokeless tobacco: Never   Tobacco comments:    Patient is participating in health coaching for smoking cessation.   Vaping Use   Vaping status: Never Used  Substance and Sexual Activity   Alcohol use: No   Drug use: No   Sexual activity: Yes    Birth control/protection: None  Other Topics Concern   Not on file  Social History Narrative   Right handed      Highest level of edu- 12th grade      7 children      Apartment- 2nd floor   Social Determinants of Health   Financial Resource Strain: Medium Risk (09/26/2022)   Overall Financial Resource Strain (CARDIA)    Difficulty of Paying Living Expenses: Somewhat hard  Food Insecurity: Food Insecurity Present (09/26/2022)   Hunger Vital Sign    Worried About Running Out of Food in the Last Year: Sometimes true    Ran Out of Food in the Last Year: Sometimes true  Transportation Needs: Unmet Transportation Needs (09/26/2022)   PRAPARE - Transportation    Lack of Transportation (Medical): Yes    Lack of Transportation (Non-Medical): Yes  Physical Activity: Unknown (09/26/2022)   Exercise Vital Sign    Days of Exercise per Week: 0 days    Minutes of Exercise per Session: Not on file  Stress: Stress Concern Present (09/26/2022)   [employer] of Occupational Health - Occupational Stress Questionnaire    Feeling of Stress : Rather much  Social Connections: Moderately Isolated (09/26/2022)   Social Connection and Isolation Panel [NHANES]    Frequency of Communication with Friends and Family: More than three times a week    Frequency of Social Gatherings with Friends and Family: Never    Attends Religious Services: Never    Doctor, general practice or Organizations: No    Attends Engineer, structural: Not on file    Marital Status: Married  Catering manager Violence: Not on file    Review of Systems  Constitutional:  Positive for fatigue.  Respiratory:  Positive for shortness of breath.   Psychiatric/Behavioral:  Positive for sleep disturbance.     Vitals:   12/02/22 0853  BP: (!) 150/110  Pulse: 86  SpO2: 100%     Physical Exam Constitutional:      Appearance: She is obese.  HENT:     Head: Normocephalic.     Mouth/Throat:     Mouth: Mucous membranes are moist.  Eyes:  General: No scleral icterus.    Pupils: Pupils are equal, round, and reactive to light.  Cardiovascular:     Rate and Rhythm: Normal rate and regular rhythm.     Heart sounds: No murmur heard.    No friction rub.  Pulmonary:     Effort: No respiratory distress.     Breath sounds: No stridor. Rhonchi present. No wheezing.  Musculoskeletal:     Cervical back: No rigidity or tenderness.  Neurological:     Mental Status: She is alert.  Psychiatric:        Mood and Affect: Mood normal.    Data Reviewed: CT with 3 mm lung nodule, emphysema 05/22/2021-reviewed by myself-CT scan was reviewed with the patient during the visit  PFT reviewed with the patient showing mild obstructive disease, air trapping suggestive of emphysema with no significant bronchodilator response  Assessment:  Emphysema on CT scan of the chest  COPD with exacerbation  She states she is willing to quit -Discussed quit assist medications and we chose to try Chantix again  History of lupus, rheumatoid arthritis  Trelegy has not really made a difference to her breathing   Plan/Recommendations:  Smoking cessation -We will try Chantix again  Replace Trelegy with Markus Daft -Samples will be provided  Albuterol as needed  Will call in a prescription for azithromycin and prednisone  Encouraged to call with significant concerns  I will see you in  about 6 months  Encouraged to call if having issues, we can see earlier  She really needs to quit smoking to feel better  Virl Diamond MD [facility] 12/02/2022, 9:03 AM  CC: Georganna Skeans, MD

## 2022-12-03 ENCOUNTER — Other Ambulatory Visit: Payer: Self-pay | Admitting: Family Medicine

## 2022-12-03 DIAGNOSIS — Z1239 Encounter for other screening for malignant neoplasm of breast: Secondary | ICD-10-CM

## 2022-12-27 ENCOUNTER — Telehealth: Payer: Self-pay | Admitting: *Deleted

## 2022-12-27 ENCOUNTER — Encounter: Payer: Self-pay | Admitting: Pulmonary Disease

## 2022-12-27 MED ORDER — DOXYCYCLINE HYCLATE 100 MG PO TABS: 100.0000 mg | ORAL_TABLET | Freq: Two times a day (BID) | ORAL | 0 refills | Status: DC

## 2022-12-27 MED ORDER — BENZONATATE 200 MG PO CAPS: 200.0000 mg | ORAL_CAPSULE | Freq: Three times a day (TID) | ORAL | 1 refills | Status: DC | PRN

## 2022-12-27 MED ORDER — PREDNISONE 10 MG PO TABS: ORAL_TABLET | ORAL | 0 refills | Status: DC

## 2022-12-27 NOTE — Telephone Encounter (Signed)
Called and spoke with patient, advised of recommendations per Rexene Edison NP.  She verbalized understanding.  Nothing further needed.

## 2022-12-27 NOTE — Telephone Encounter (Signed)
Begin doxycycline 100 mg twice daily #14 no refills Prednisone taper over the next week Prescriptions were sent to the pharmacy. May use Mucinex DM liquid as needed for cough and congestion.  Will send in [facility] for cough as needed if symptoms or not improving will need office visit for further evaluation  Continue on Breztri inhaler twice daily Albuterol as needed  Please contact office for sooner follow up if symptoms do not improve or worsen or seek emergency care

## 2022-12-27 NOTE — Telephone Encounter (Signed)
Converted from [employer] message:   I'm still have the bad coughing spells I been taken cough medicine, Also its helps  a little bit but my tightness and the headache and stuffiness can't breathe is what I'm feeling now   Primary Pulmonologist: Ol[facility]re Last office visit and with whom: 12/02/2022 Olalere What do we see them for (pulmonary problems): Emphysema, sob DOE Last OV assessment/plan:  Instructions   Return in about 8 weeks (around 01/27/2023). Provide sample of Breztri   Prescription for Markus Daft will be sent to pharmacy   Prescription for azithromycin and prednisone   Work on quitting smoking   Prescription for Chantix will be sent to pharmacy   Follow-up in 8 to 12 weeks      Was appointment offered to patient (explain)?  No, just seen by Dr. Wynona Neat on 12/02/2022   Reason for call: She is using the [facility] twice daily, she is ok after the am and pm dose, she has coughing fits during the day to the point she vomits or is incontinent, had to call out of work today.  Coughing since Thursday (11/28).  Took Covid test Saturday and it was negative.  She has been using the dayquil and nyquil.  Dayquil does not help.  She has an albuterol inhaler.  She is complaining of nasal and sinus congestion with a headache. Cough is dry and no mucous.  She is still smoking 1 cigarette per day, she says she chokes when she smokes.   She was better after the Azithromycin and Prednisone for a little while and then started feeling bad again.   Advised I would send a message to the provider of the day as Dr. Wynona Neat is off this week.  Tammy, please advise.  Thank you.,  (examples of things to ask: : When did symptoms start? Fever? Cough? Productive? Color to sputum? More sputum than usual? Wheezing? Have you needed increased oxygen? Are you taking your respiratory medications? What over the counter measures have you tried?)  Allergies  Allergen Reactions   Labetalol Anxiety    Headaches Headaches     Immunization History  Administered Date(s) Administered   Influenza, Quadrivalent, Recombinant, Inj, Pf 10/22/2015   Influenza,inj,Quad PF,6+ Mos 11/08/2013, 10/25/2018, 02/06/2021, 09/29/2021   PFIZER(Purple Top)SARS-COV-2 Vaccination 04/13/2019, 05/09/2019, 11/24/2019   Pneumococcal Conjugate-13 11/04/2014   Pneumococcal Polysaccharide-23 11/08/2013, 02/06/2021   Zoster Recombinant(Shingrix) 09/29/2021, 12/03/2021

## 2022-12-30 ENCOUNTER — Ambulatory Visit: Payer: 59 | Admitting: Family Medicine

## 2023-01-06 ENCOUNTER — Ambulatory Visit (HOSPITAL_BASED_OUTPATIENT_CLINIC_OR_DEPARTMENT_OTHER): Payer: 59 | Admitting: Family

## 2023-01-06 ENCOUNTER — Encounter (HOSPITAL_BASED_OUTPATIENT_CLINIC_OR_DEPARTMENT_OTHER): Payer: Self-pay | Admitting: Family

## 2023-01-06 VITALS — BP 180/106 | HR 95 | Ht 64.0 in | Wt 166.8 lb

## 2023-01-06 DIAGNOSIS — G4733 Obstructive sleep apnea (adult) (pediatric): Secondary | ICD-10-CM | POA: Diagnosis not present

## 2023-01-06 DIAGNOSIS — Z720 Tobacco use: Secondary | ICD-10-CM | POA: Diagnosis not present

## 2023-01-06 DIAGNOSIS — E782 Mixed hyperlipidemia: Secondary | ICD-10-CM | POA: Diagnosis not present

## 2023-01-06 DIAGNOSIS — J432 Centrilobular emphysema: Secondary | ICD-10-CM

## 2023-01-06 DIAGNOSIS — I1A0 Resistant hypertension: Secondary | ICD-10-CM

## 2023-01-06 MED ORDER — CLONIDINE 0.2 MG/24HR TD PTWK: 0.2000 mg | MEDICATED_PATCH | TRANSDERMAL | 5 refills | Status: DC

## 2023-01-06 NOTE — Patient Instructions (Signed)
Medication Instructions:  CHANGE Clonidine to 0.2mg  patch weekly  Follow-Up: In 2-3 months with [facility] Dr. Duke Salvia or Alver Sorrow, NP   Special Instructions:   Tips to Measure your Blood Pressure Correctly  Here's what you can do to ensure a correct reading:  Don't drink a caffeinated beverage or smoke during the 30 minutes before the test.  Sit quietly for five minutes before the test begins.  During the measurement, sit in a chair with your feet on the floor and your arm supported so your elbow is at about heart level.  The inflatable part of the cuff should completely cover at least 80% of your upper arm, and the cuff should be placed on bare skin, not over a shirt.  Don't talk during the measurement.  Blood pressure categories  Blood pressure category SYSTOLIC (upper number)  DIASTOLIC (lower number)  Normal Less than 120 mm Hg and Less than 80 mm Hg  Elevated 120-129 mm Hg and Less than 80 mm Hg  High blood pressure: Stage 1 hypertension 130-139 mm Hg or 80-89 mm Hg  High blood pressure: Stage 2 hypertension 140 mm Hg or higher or 90 mm Hg or higher  Hypertensive crisis (consult your doctor immediately) Higher than 180 mm Hg and/or Higher than 120 mm Hg  Source: American Heart Association and American Stroke Association. For more on getting your blood pressure under control, buy Controlling Your Blood Pressure, a Special Health Report from [facility].   Blood Pressure Log   Date   Time  Blood Pressure  Example: Nov 1 9 AM 124/78

## 2023-01-06 NOTE — Progress Notes (Signed)
[facility] Assessment:    Date:  01/06/2023   ID:  Denise Carter, DOB Aug 15, 1970, MRN [id number]  PCP:  Georganna Skeans, MD  Cardiologist:  Chilton Si, MD  Nephrologist:  Referring MD: Georganna Skeans, MD   CC: Hypertension  History of Present Illness:    Denise Carter is a 52 y.o. female with a hx of  with a hx of RA, Sjorgren's, SLE, resistant hypertension, OSA, tobacco use, varicose veins, hyperlipidemia.   Hypertension initially diagnosed in 2015 at time of SLE diagnosis. Previously seen by Dr. Eden Emms but has transitioned to Dr. Duke Salvia. Renal artery dopplers 2019 normal. Plasma catecholamines and metanephrines negative. Coronary CTA 05/2019 with normal coronary arteries. Echo 05/2019 normal LVEF 60-65%, gr2DD. Renin and aldosterone normal for hyperaldosteronism but concerning for Cushing syndrome. Referred to endocrinology who did not feel her labs were consistent with Cushing. Sleep study showed OSA recommended for CPAP. Did not tolerate CPAP, would need AHI >15 for Inspire but has not completed in lab PSG.   Echo 04/2021 LVEF 60-65%, no rWMA, mild LVH, gr1DD, mild AI. She was referred to pulmonology.   Valsartan previously transitioned to [place]. Spironolactone and Carvedilol increased. She was later started on Clonidine 0.1mg  BID and at visit 04/14/22 it was increased to TID. She was referred for renal denervation but her insurance did not cover. At visit 10/26/22 Clonidine transitioned from tablet to patch for more consistent dosing.   Presents today for follow up. Working at [employer] but daytime hours 4p-9p. BP at home has been labile.  Most often at home SBP 150s-160s. Attributes elevated BP to stress and recent COPD exacerbation requiring 2 courses of abx.  Taking medications morning and evening.  Remains out of Lyrica, Plaquenilil with significant pain. Has been discharged from multiple rheumatology practices. Previously did not have success with  nicotine patch, pulmonology as prescribed Chantix but not yet started. Noted at time of her respiratory infection she had some chest pain which she attributes to coughing, cough and chest pain have improved. Does note difficulty with pill burden noting some missed doses which she attributes to brain fog. Has worked to reduce caffeine intake now 2 cups of coffee and occasional soda as reports soda helps with her indigestion. Plans to see GI to re-discuss indigestion and lack of appetite.   Previous intolerances: Labetalol - headaches HCTZ - headaches Valsartan - diarrhea Amlodipine causes headaches at 10 mg, but tolerates 5mg  Hydralazine - headaches  Past Medical History:  Diagnosis Date   Atypical chest pain 05/23/2019   Essential hypertension 05/23/2019   Hypertension    Lupus    OSA (obstructive sleep apnea) 11/19/2019   Resistant hypertension 05/23/2019   Shortness of breath 05/23/2019   Snoring 05/23/2019   Thyroid disease    Varicose veins of left lower extremity 01/26/2020    Past Surgical History:  Procedure Laterality Date   ABDOMINAL HYSTERECTOMY      Current Medications: Current Meds  Medication Sig   albuterol (VENTOLIN HFA) 108 (90 Base) MCG/ACT inhaler Inhale 2 puffs into the lungs every 6 (six) hours as needed for wheezing or shortness of breath.   amLODipine (NORVASC) 10 MG tablet Take 1 tablet (10 mg total) by mouth daily.   aspirin (EC-81 ASPIRIN) 81 MG EC tablet Take 1 tablet (81 mg total) by mouth daily. Swallow whole.   atorvastatin (LIPITOR) 40 MG tablet Take 1 tablet (40 mg total) by mouth daily.   benzonatate (TESSALON) 200 MG capsule Take 1  capsule (200 mg total) by mouth 3 (three) times daily as needed.   Budeson-Glycopyrrol-Formoterol (BREZTRI AEROSPHERE) 160-9-4.8 MCG/ACT AERO Inhale 2 puffs into the lungs in the morning and at bedtime.   chlorthalidone (HYGROTON) 25 MG tablet Take 1 tablet (25 mg total) by mouth daily.   fluticasone (FLONASE) 50  MCG/ACT nasal spray Place 2 sprays into both nostrils daily.   irbesartan (AVAPRO) 300 MG tablet Take 1 tablet (300 mg total) by mouth daily.   KLS OMPERAZOLE 20 MG TBEC Take 1 tablet by mouth daily.   predniSONE (DELTASONE) 10 MG tablet 4 tabs for 2 days, then 3 tabs for 2 days, 2 tabs for 2 days, then 1 tab for 2 days, then stop   spironolactone (ALDACTONE) 100 MG tablet Take 2 tablets (200 mg total) by mouth daily.   Vitamin D, Ergocalciferol, (DRISDOL) 1.25 MG (50000 UNIT) CAPS capsule Take 1 capsule (50,000 Units total) by mouth every 7 (seven) days.   [DISCONTINUED] cloNIDine (CATAPRES - DOSED IN MG/24 HR) 0.1 mg/24hr patch Place 1 patch (0.1 mg total) onto the skin once a week.     Allergies:   Labetalol   Social History   Socioeconomic History   Marital status: Married    Spouse name: Not on file   Number of children: Not on file   Years of education: Not on file   Highest education level: GED or equivalent  Occupational History   Not on file  Tobacco Use   Smoking status: Every Day    Current packs/day: 1.00    Average packs/day: 1 pack/day for 31.9 years (31.9 ttl pk-yrs)    Types: Cigarettes    Start date: 01/26/1991   Smokeless tobacco: Never   Tobacco comments:    Patient is participating in health coaching for smoking cessation.   Vaping Use   Vaping status: Never Used  Substance and Sexual Activity   Alcohol use: No   Drug use: No   Sexual activity: Yes    Birth control/protection: None  Other Topics Concern   Not on file  Social History Narrative   Right handed      Highest level of edu- 12th grade      7 children      Apartment- 2nd floor   Social Drivers of Health   Financial Resource Strain: Medium Risk (09/26/2022)   Overall Financial Resource Strain (CARDIA)    Difficulty of Paying Living Expenses: Somewhat hard  Food Insecurity: Food Insecurity Present (09/26/2022)   Hunger Vital Sign    Worried About Running Out of Food in the Last Year:  Sometimes true    Ran Out of Food in the Last Year: Sometimes true  Transportation Needs: Unmet Transportation Needs (09/26/2022)   PRAPARE - Transportation    Lack of Transportation (Medical): Yes    Lack of Transportation (Non-Medical): Yes  Physical Activity: Unknown (09/26/2022)   Exercise Vital Sign    Days of Exercise per Week: 0 days    Minutes of Exercise per Session: Not on file  Stress: Stress Concern Present (09/26/2022)   [employer] of Occupational Health - Occupational Stress Questionnaire    Feeling of Stress : Rather much  Social Connections: Moderately Isolated (09/26/2022)   Social Connection and Isolation Panel [NHANES]    Frequency of Communication with Friends and Family: More than three times a week    Frequency of Social Gatherings with Friends and Family: Never    Attends Religious Services: Never    Active Member  of Clubs or Organizations: No    Attends Engineer, structural: Not on file    Marital Status: Married     Family History: The patient's family history includes Breast cancer (age of onset: 20 50 11) in her maternal aunt; CAD in her maternal grandmother; Heart failure in her maternal grandmother; Hypertension in her maternal grandmother and mother; Lupus in her maternal grandmother; Stroke in her maternal grandmother. There is no history of Adrenal disorder.  ROS:   Please see the history of present illness.     All other systems reviewed and are negative.  EKGs/Labs/Other Studies Reviewed:         Recent Labs: 09/16/2022: ALT 7; BUN 9; Creatinine, Ser 0.84; Hemoglobin 12.9; Platelets 166; Potassium 4.3; Sodium 142; TSH 1.600   Recent Lipid Panel    Component Value Date/Time   CHOL 165 09/16/2022 1112   TRIG 83 09/16/2022 1112   HDL 56 09/16/2022 1112   CHOLHDL 2.9 09/16/2022 1112   LDLCALC 93 09/16/2022 1112    Physical Exam:   VS:  BP (!) 180/106   Pulse 95   Ht 5\' 4"  (1.626 m)   Wt 166 lb 12.8 oz (75.7 kg)   SpO2 95%   BMI  28.63 kg/m  , BMI Body mass index is 28.63 kg/m. GENERAL:  Well appearing HEENT: Pupils equal round and reactive, fundi not visualized, oral mucosa unremarkable NECK:  No jugular venous distention, waveform within normal limits, carotid upstroke brisk and symmetric, no bruits, no thyromegaly LYMPHATICS:  No cervical adenopathy LUNGS:  Clear to auscultation bilaterally HEART:  RRR.  PMI not displaced or sustained,S1 and S2 within normal limits, no S3, no S4, no clicks, no rubs, no murmurs ABD:  Flat, positive bowel sounds normal in frequency in pitch, no bruits, no rebound, no guarding, no midline pulsatile mass, no hepatomegaly, no splenomegaly EXT:  2 plus pulses throughout, no edema, no cyanosis no clubbing SKIN:  No rashes no nodules NEURO:  Cranial nerves II through XII grossly intact, motor grossly intact throughout PSYCH:  Cognitively intact, oriented to person place and time   ASSESSMENT/PLAN:    OSA - Declines CPAP trial due to fear of being strangled.Per previous discussion with  Dr. Mayford Knife. Will need AHI Of >15 to qualify for Inspire. In lab PSG sleep study previously ordered but not completed.    Tobacco use - Reports Wellbutrin, nicotine patch have not worked. Encouraged ot gradually reduce.  Recommend utilization of 1800QUITNOW. 04/2022 CT Lung RADS2 - repeat CT 04/2023. Pulmonology has prescribed Chantix which she is planning to start.   Resistant HTN - Not at goal. Insurance does not cover RDN. Increase Clonidine 0.1mg  to 0.2 patch weekly. Can further up-titrate at follow up.  Multiple prior medication intolerances detailed above.  Hesitant to utilize Imdur as she has had headache with multiple her medications. Continue Amlodipine 10mg  daily, Coreg 25mg  BID, Chlorthalidone 25mg  daily, Spironolactone 200mg  daily, Irbesartan 300mg  daily. Her pharmacy does not offer adherence packaging Consider resubmit for RDN after new year to see if coverage has changed Secondary hypertension  workup: 12/2017 normal renal arteries 05/2020 normal thyroid function OSA - declines CPAP.   Aortic atherosclerosis / HLD - 08/2022 LDL 93.  Continue Atorvastatin. Management per PCP.  Centrilobular emphysema - on Trelegy and PRN Albuterol per Dr. Wynona Neat.    Screening for Secondary Hypertension:     Relevant Labs/Studies:    Latest Ref Rng & Units 09/16/2022   11:12 AM 02/16/2022  11:42 AM 03/04/2021    9:39 AM  Basic Labs  Sodium 134 - 144 mmol/L 142  144  141   Potassium 3.5 - 5.2 mmol/L 4.3  3.7  4.7   Creatinine 0.57 - 1.00 mg/dL 1.61  0.96  0.45        Latest Ref Rng & Units 09/16/2022   11:12 AM 06/05/2020    7:56 AM  Thyroid   TSH 0.450 - 4.500 uIU/mL 1.600  1.82        Latest Ref Rng & Units 08/03/2019    2:01 PM 07/23/2019    2:04 PM 05/23/2019    9:25 AM  Renin/Aldosterone   Aldosterone ** ng/dL 3  1  3.1   Renin 4.098 - 5.380 ng/mL/hr   <0.167   Aldos/Renin Ratio 0.0 - 30.0   >18.6        Latest Ref Rng & Units 07/23/2019    2:04 PM 05/23/2019    9:25 AM  Metanephrines/Catecholamines   Epinephrine pg/mL <20    Norepinephrine pg/mL 319    Dopamine pg/mL <10    Metanephrines <=57 pg/mL 56  51.8   Normetanephrines  <=148 pg/mL 84  103.3           01/23/2018   12:26 PM  Renovascular   Renal Artery [place] Completed Yes     Disposition:    FU with MD/PharmD in 2-3 months    Medication Adjustments/Labs and Tests Ordered: Current medicines are reviewed at length with the patient today.  Concerns regarding medicines are outlined above.  No orders of the defined types were placed in this encounter.  Meds ordered this encounter  Medications   cloNIDine (CATAPRES - DOSED IN MG/24 HR) 0.2 mg/24hr patch    Sig: Place 1 patch (0.2 mg total) onto the skin once a week.    Dispense:  4 patch    Refill:  5    Discontinue 0.1mg  dose    Supervising Provider:   Jodelle Red [[id number]]     Signed, Alver Sorrow, NP  01/06/2023 2:49 PM    [facility]

## 2023-01-13 ENCOUNTER — Encounter: Payer: Self-pay | Admitting: Pulmonary Disease

## 2023-01-21 ENCOUNTER — Encounter: Payer: Self-pay | Admitting: Pulmonary Disease

## 2023-02-09 ENCOUNTER — Encounter: Payer: Self-pay | Admitting: Pulmonary Disease

## 2023-02-09 ENCOUNTER — Telehealth: Payer: Self-pay | Admitting: Family Medicine

## 2023-02-09 ENCOUNTER — Ambulatory Visit: Payer: 59 | Admitting: Pulmonary Disease

## 2023-02-09 VITALS — BP 160/100 | HR 93 | Temp 98.5°F | Ht 64.0 in | Wt 167.0 lb

## 2023-02-09 DIAGNOSIS — J449 Chronic obstructive pulmonary disease, unspecified: Secondary | ICD-10-CM | POA: Diagnosis not present

## 2023-02-09 DIAGNOSIS — J432 Centrilobular emphysema: Secondary | ICD-10-CM

## 2023-02-09 DIAGNOSIS — R053 Chronic cough: Secondary | ICD-10-CM

## 2023-02-09 NOTE — Telephone Encounter (Signed)
 Copied from CRM [id number]. Topic: Referral - Request for Referral >> Feb 09, 2023 12:18 PM May Sparks wrote: Pt Denise Carter is requesting a referral for a kidney doctor.  Per the advice of Dr, Margaretann Sharper, MD.... Please advise

## 2023-02-09 NOTE — Progress Notes (Signed)
 Denise Carter    [id number]    February 12, 1970  Primary Care Physician:Wilson, Ray Caffey, MD  Referring Physician: Abraham Abo, MD [address] [place],  [place] [address]  Chief complaint:   Patient is being seen for shortness of breath  HPI:  Has been doing okay Persistent cough, worse at night  She does have a history of reflux for which she takes a PPI  This more likely is contributing to symptoms  She is not feeling acutely ill No fever, no chills  Continues to use Breztri   Frustrated with using many blood pressure medications are not blood pressure still not controlled, has not been using her blood pressure meds recently Encouraged to resume using blood pressure medications because of risks involved  She is compliant with Breztri   History of mild obstructive sleep apnea Sjogren's syndrome Chronic fatigue syndrome Polyneuropathy due to lupus History of rheumatoid arthritis Psoriatic arthritis Polyclonal gammopathy  Recent sleep study does reveal mild obstructive sleep apnea  No pertinent occupational history  Recent CT with lung nodules and emphysema  Outpatient Encounter Medications as of 02/09/2023  Medication Sig   albuterol  (VENTOLIN  HFA) 108 (90 Base) MCG/ACT inhaler Inhale 2 puffs into the lungs every 6 (six) hours as needed for wheezing or shortness of breath.   benzonatate  (TESSALON ) 200 MG capsule Take 1 capsule (200 mg total) by mouth 3 (three) times daily as needed.   Budeson-Glycopyrrol-Formoterol (BREZTRI  AEROSPHERE) 160-9-4.8 MCG/ACT AERO Inhale 2 puffs into the lungs in the morning and at bedtime.   amLODipine  (NORVASC ) 10 MG tablet Take 1 tablet (10 mg total) by mouth daily. (Patient not taking: Reported on 02/09/2023)   aspirin  (EC-81 ASPIRIN ) 81 MG EC tablet Take 1 tablet (81 mg total) by mouth daily. Swallow whole. (Patient not taking: Reported on 02/09/2023)   atorvastatin  (LIPITOR) 40 MG tablet Take 1 tablet (40 mg  total) by mouth daily. (Patient not taking: Reported on 02/09/2023)   carvedilol  (COREG ) 25 MG tablet Take 1 tablet (25 mg total) by mouth 2 (two) times daily.   chlorthalidone  (HYGROTON ) 25 MG tablet Take 1 tablet (25 mg total) by mouth daily. (Patient not taking: Reported on 02/09/2023)   cloNIDine  (CATAPRES  - DOSED IN MG/24 HR) 0.2 mg/24hr patch Place 1 patch (0.2 mg total) onto the skin once a week. (Patient not taking: Reported on 02/09/2023)   FLUoxetine  (PROZAC ) 20 MG tablet Take 1 tablet (20 mg total) by mouth daily. (Patient not taking: Reported on 02/09/2023)   fluticasone  (FLONASE ) 50 MCG/ACT nasal spray Place 2 sprays into both nostrils daily. (Patient not taking: Reported on 02/09/2023)   ibuprofen (ADVIL) 200 MG tablet Take 200 mg by mouth every 6 (six) hours as needed. (Patient not taking: Reported on 02/09/2023)   irbesartan  (AVAPRO ) 300 MG tablet Take 1 tablet (300 mg total) by mouth daily. (Patient not taking: Reported on 02/09/2023)   KLS OMPERAZOLE 20 MG TBEC Take 1 tablet by mouth daily. (Patient not taking: Reported on 02/09/2023)   pantoprazole  (PROTONIX ) 20 MG tablet Take 1 tablet (20 mg total) by mouth daily. (Patient not taking: Reported on 02/09/2023)   predniSONE  (DELTASONE ) 10 MG tablet 4 tabs for 2 days, then 3 tabs for 2 days, 2 tabs for 2 days, then 1 tab for 2 days, then stop (Patient not taking: Reported on 02/09/2023)   pregabalin  (LYRICA ) 200 MG capsule Take 1 capsule (200 mg total) by mouth 2 (two) times daily. (Patient not taking:  Reported on 02/09/2023)   spironolactone  (ALDACTONE ) 100 MG tablet Take 2 tablets (200 mg total) by mouth daily. (Patient not taking: Reported on 02/09/2023)   tamsulosin  (FLOMAX ) 0.4 MG CAPS capsule Take 1 capsule (0.4 mg total) by mouth daily. (Patient not taking: Reported on 02/09/2023)   varenicline  (CHANTIX  CONTINUING MONTH PAK) 1 MG tablet Take 1 tablet (1 mg total) by mouth 2 (two) times daily. (Patient not taking: Reported on 02/09/2023)    varenicline  (CHANTIX ) 0.5 MG tablet Take 1 tablet (0.5 mg total) by mouth 2 (two) times daily. 0.5 mg daily for 3 days then 0.5 mg twice a day for 4 days (Patient not taking: Reported on 02/09/2023)   Vitamin D , Ergocalciferol , (DRISDOL ) 1.25 MG (50000 UNIT) CAPS capsule Take 1 capsule (50,000 Units total) by mouth every 7 (seven) days. (Patient not taking: Reported on 02/09/2023)   No facility-administered encounter medications on file as of 02/09/2023.    Allergies as of 02/09/2023 - Review Complete 02/09/2023  Allergen Reaction Noted   Labetalol  Anxiety 10/25/2018    Past Medical History:  Diagnosis Date   Atypical chest pain 05/23/2019   Essential hypertension 05/23/2019   Hypertension    Lupus    OSA (obstructive sleep apnea) 11/19/2019   Resistant hypertension 05/23/2019   Shortness of breath 05/23/2019   Snoring 05/23/2019   Thyroid  disease    Varicose veins of left lower extremity 01/26/2020    Past Surgical History:  Procedure Laterality Date   ABDOMINAL HYSTERECTOMY      Family History  Problem Relation Age of Onset   Hypertension Mother    Breast cancer Maternal Aunt 70 - 49   Hypertension Maternal Grandmother    Lupus Maternal Grandmother    Heart failure Maternal Grandmother    Stroke Maternal Grandmother    CAD Maternal Grandmother    Adrenal disorder Neg Hx     Social History   Socioeconomic History   Marital status: Married    Spouse name: Not on file   Number of children: Not on file   Years of education: Not on file   Highest education level: GED or equivalent  Occupational History   Not on file  Tobacco Use   Smoking status: Every Day    Current packs/day: 1.00    Average packs/day: 1 pack/day for 32.0 years (32.0 ttl pk-yrs)    Types: Cigarettes    Start date: 01/26/1991   Smokeless tobacco: Never   Tobacco comments:    Patient is participating in health coaching for smoking cessation.   Vaping Use   Vaping status: Never Used  Substance  and Sexual Activity   Alcohol use: No   Drug use: No   Sexual activity: Yes    Birth control/protection: None  Other Topics Concern   Not on file  Social History Narrative   Right handed      Highest level of edu- 12th grade      7 children      Apartment- 2nd floor   Social Drivers of Health   Financial Resource Strain: Medium Risk (09/26/2022)   Overall Financial Resource Strain (CARDIA)    Difficulty of Paying Living Expenses: Somewhat hard  Food Insecurity: Food Insecurity Present (09/26/2022)   Hunger Vital Sign    Worried About Running Out of Food in the Last Year: Sometimes true    Ran Out of Food in the Last Year: Sometimes true  Transportation Needs: Unmet Transportation Needs (09/26/2022)   PRAPARE - Transportation  Lack of Transportation (Medical): Yes    Lack of Transportation (Non-Medical): Yes  Physical Activity: Unknown (09/26/2022)   Exercise Vital Sign    Days of Exercise per Week: 0 days    Minutes of Exercise per Session: Not on file  Stress: Stress Concern Present (09/26/2022)   [employer] of Occupational Health - Occupational Stress Questionnaire    Feeling of Stress : Rather much  Social Connections: Moderately Isolated (09/26/2022)   Social Connection and Isolation Panel [NHANES]    Frequency of Communication with Friends and Family: More than three times a week    Frequency of Social Gatherings with Friends and Family: Never    Attends Religious Services: Never    Database administrator or Organizations: No    Attends Engineer, structural: Not on file    Marital Status: Married  Catering manager Violence: Not on file    Review of Systems  Constitutional:  Positive for fatigue.  Respiratory:  Positive for cough and shortness of breath.   Psychiatric/Behavioral:  Positive for sleep disturbance.     Vitals:   02/09/23 1128  BP: (!) 160/100  Pulse: 93  Temp: 98.5 F (36.9 C)  SpO2: 100%     Physical Exam Constitutional:       Appearance: She is obese.  HENT:     Head: Normocephalic.     Mouth/Throat:     Mouth: Mucous membranes are moist.  Eyes:     General: No scleral icterus.    Pupils: Pupils are equal, round, and reactive to light.  Cardiovascular:     Rate and Rhythm: Normal rate and regular rhythm.     Heart sounds: No murmur heard.    No friction rub.  Pulmonary:     Effort: No respiratory distress.     Breath sounds: No stridor. Rhonchi present. No wheezing.  Musculoskeletal:     Cervical back: No rigidity or tenderness.  Neurological:     Mental Status: She is alert.  Psychiatric:        Mood and Affect: Mood normal.   Data Reviewed: CT with 3 mm lung nodule, emphysema 05/22/2021-reviewed by myself-CT scan was reviewed with the patient during the visit  PFT reviewed with the patient showing mild obstructive disease, air trapping suggestive of emphysema with no significant bronchodilator response  Assessment:  Emphysema on CT scan of the chest  COPD which is stable at present -Will continue to use Breztri   History of lupus, history of rheumatoid arthritis  Reflux may be playing a role with chronic cough   Plan/Recommendations:  Continue Breztri   Continue albuterol  as needed  No need for antibiotics at present  She will research a wedge to help with elevating the head of the bed with nighttime sleep  Follow-up in about 3 months  Refills for Tessalon  Perles sent in to pharmacy  Encouraged to call with significant concerns  Encouraged to resume blood pressure medications   Myer Artis MD [facility] 02/09/2023, 11:30 AM  CC: Abraham Abo, MD

## 2023-02-09 NOTE — Patient Instructions (Addendum)
 I will see him back in about 3 months  Continue using your Breztri   Will send in refills for the cough medicine  Try and get back on your blood pressure medications  Call us  with significant concerns  Try a wedge to raise the head of the bed as you are sleeping as this may reduce reflux

## 2023-02-11 NOTE — Telephone Encounter (Signed)
Pt scheduled next available appt with PCP, she needs a referral for nephrology. Please advise  Pt wants to know if she can receive her referral prior to her appt in February?

## 2023-02-16 ENCOUNTER — Other Ambulatory Visit: Payer: Self-pay | Admitting: Family Medicine

## 2023-02-16 NOTE — Telephone Encounter (Signed)
Requested Prescriptions  Pending Prescriptions Disp Refills   albuterol (VENTOLIN HFA) 108 (90 Base) MCG/ACT inhaler [Pharmacy Med Name: Albuterol Sulfate HFA Inhalation Aerosol Solution 108 (90 Base) MCG/ACT] 8.5 g 2    Sig: INHALE 2 PUFFS BY MOUTH INTO THE LUNGS EVERY 6 HOURS AS NEEDED FOR WHEEZING OR SHORTNESS OF BREATH     Pulmonology:  Beta Agonists 2 Failed - 02/16/2023  5:31 PM      Failed - Last BP in normal range    BP Readings from Last 1 Encounters:  02/09/23 (!) 160/100         Passed - Last Heart Rate in normal range    Pulse Readings from Last 1 Encounters:  02/09/23 93         Passed - Valid encounter within last 12 months    Recent Outpatient Visits           4 months ago Essential hypertension   [facility], MD   5 months ago Annual physical exam   [place] [facility], MD   1 year ago Uncontrolled hypertension   [place] [facility], MD   1 year ago Uncontrolled hypertension   [place] [facility], MD   2 years ago Uncontrolled hypertension   [place] [facility], MD       Future Appointments             In 2 weeks Georganna Skeans, MD [facility]   In 3 months Tomma Lightning, MD [facility]

## 2023-02-23 ENCOUNTER — Encounter: Payer: Self-pay | Admitting: *Deleted

## 2023-02-23 ENCOUNTER — Other Ambulatory Visit: Payer: Self-pay | Admitting: Family Medicine

## 2023-02-23 DIAGNOSIS — I10 Essential (primary) hypertension: Secondary | ICD-10-CM

## 2023-03-03 ENCOUNTER — Ambulatory Visit: Payer: 59 | Admitting: Family Medicine

## 2023-03-03 VITALS — BP 170/132 | HR 86 | Temp 97.9°F | Resp 18 | Ht 64.0 in | Wt 162.6 lb

## 2023-03-03 DIAGNOSIS — F32A Depression, unspecified: Secondary | ICD-10-CM | POA: Diagnosis not present

## 2023-03-03 DIAGNOSIS — F419 Anxiety disorder, unspecified: Secondary | ICD-10-CM | POA: Diagnosis not present

## 2023-03-03 DIAGNOSIS — I10 Essential (primary) hypertension: Secondary | ICD-10-CM

## 2023-03-03 DIAGNOSIS — F17200 Nicotine dependence, unspecified, uncomplicated: Secondary | ICD-10-CM

## 2023-03-03 DIAGNOSIS — G4700 Insomnia, unspecified: Secondary | ICD-10-CM | POA: Diagnosis not present

## 2023-03-03 DIAGNOSIS — Z23 Encounter for immunization: Secondary | ICD-10-CM

## 2023-03-03 DIAGNOSIS — F17210 Nicotine dependence, cigarettes, uncomplicated: Secondary | ICD-10-CM

## 2023-03-03 MED ORDER — MIRTAZAPINE 7.5 MG PO TABS: 7.5000 mg | ORAL_TABLET | Freq: Every day | ORAL | 1 refills | Status: DC

## 2023-03-03 NOTE — Progress Notes (Signed)
 Established Patient Office Visit  Subjective    Patient ID: Denise Carter, female    DOB: 1970-06-15  Age: 53 y.o. MRN: [id number]  CC:  Chief Complaint  Patient presents with   referral to nephnology,     Coughing a lot and urinate on self and can't stop, not sleeping, gain weight and not eating    HPI Denise Carter presents for follow up of hypertension. Patient reports that she has stopped taking her blood pressure meds. She was frustrated that they were not controlling her pressure sufficiently. Patient is smoking ppd. She also reports increased social stressors.   Outpatient Encounter Medications as of 03/03/2023  Medication Sig   albuterol  (VENTOLIN  HFA) 108 (90 Base) MCG/ACT inhaler INHALE 2 PUFFS BY MOUTH INTO THE LUNGS EVERY 6 HOURS AS NEEDED FOR WHEEZING OR SHORTNESS OF BREATH   benzonatate  (TESSALON ) 200 MG capsule Take 1 capsule (200 mg total) by mouth 3 (three) times daily as needed.   Budeson-Glycopyrrol-Formoterol (BREZTRI  AEROSPHERE) 160-9-4.8 MCG/ACT AERO Inhale 2 puffs into the lungs in the morning and at bedtime.   mirtazapine  (REMERON ) 7.5 MG tablet Take 1 tablet (7.5 mg total) by mouth at bedtime.   amLODipine  (NORVASC ) 10 MG tablet Take 1 tablet (10 mg total) by mouth daily. (Patient not taking: Reported on 02/09/2023)   aspirin  (EC-81 ASPIRIN ) 81 MG EC tablet Take 1 tablet (81 mg total) by mouth daily. Swallow whole. (Patient not taking: Reported on 02/09/2023)   atorvastatin  (LIPITOR) 40 MG tablet Take 1 tablet (40 mg total) by mouth daily. (Patient not taking: Reported on 02/09/2023)   carvedilol  (COREG ) 25 MG tablet Take 1 tablet (25 mg total) by mouth 2 (two) times daily.   chlorthalidone  (HYGROTON ) 25 MG tablet Take 1 tablet (25 mg total) by mouth daily. (Patient not taking: Reported on 02/09/2023)   cloNIDine  (CATAPRES  - DOSED IN MG/24 HR) 0.2 mg/24hr patch Place 1 patch (0.2 mg total) onto the skin once a week. (Patient not taking: Reported on 02/09/2023)    fluticasone  (FLONASE ) 50 MCG/ACT nasal spray Place 2 sprays into both nostrils daily. (Patient not taking: Reported on 02/09/2023)   ibuprofen (ADVIL) 200 MG tablet Take 200 mg by mouth every 6 (six) hours as needed. (Patient not taking: Reported on 02/09/2023)   irbesartan  (AVAPRO ) 300 MG tablet Take 1 tablet (300 mg total) by mouth daily. (Patient not taking: Reported on 02/09/2023)   KLS OMPERAZOLE 20 MG TBEC Take 1 tablet by mouth daily. (Patient not taking: Reported on 02/09/2023)   pantoprazole  (PROTONIX ) 20 MG tablet Take 1 tablet (20 mg total) by mouth daily. (Patient not taking: Reported on 02/09/2023)   predniSONE  (DELTASONE ) 10 MG tablet 4 tabs for 2 days, then 3 tabs for 2 days, 2 tabs for 2 days, then 1 tab for 2 days, then stop (Patient not taking: Reported on 02/09/2023)   pregabalin  (LYRICA ) 200 MG capsule Take 1 capsule (200 mg total) by mouth 2 (two) times daily. (Patient not taking: Reported on 02/09/2023)   spironolactone  (ALDACTONE ) 100 MG tablet Take 2 tablets (200 mg total) by mouth daily. (Patient not taking: Reported on 02/09/2023)   tamsulosin  (FLOMAX ) 0.4 MG CAPS capsule Take 1 capsule (0.4 mg total) by mouth daily. (Patient not taking: Reported on 02/09/2023)   varenicline  (CHANTIX  CONTINUING MONTH PAK) 1 MG tablet Take 1 tablet (1 mg total) by mouth 2 (two) times daily. (Patient not taking: Reported on 02/09/2023)   varenicline  (CHANTIX ) 0.5 MG tablet Take 1 tablet (  0.5 mg total) by mouth 2 (two) times daily. 0.5 mg daily for 3 days then 0.5 mg twice a day for 4 days (Patient not taking: Reported on 02/09/2023)   Vitamin D , Ergocalciferol , (DRISDOL ) 1.25 MG (50000 UNIT) CAPS capsule Take 1 capsule (50,000 Units total) by mouth every 7 (seven) days. (Patient not taking: Reported on 02/09/2023)   [DISCONTINUED] FLUoxetine  (PROZAC ) 20 MG tablet Take 1 tablet (20 mg total) by mouth daily. (Patient not taking: Reported on 02/09/2023)   No facility-administered encounter medications on  file as of 03/03/2023.    Past Medical History:  Diagnosis Date   Atypical chest pain 05/23/2019   Essential hypertension 05/23/2019   Hypertension    Lupus    OSA (obstructive sleep apnea) 11/19/2019   Resistant hypertension 05/23/2019   Shortness of breath 05/23/2019   Snoring 05/23/2019   Thyroid  disease    Varicose veins of left lower extremity 01/26/2020    Past Surgical History:  Procedure Laterality Date   ABDOMINAL HYSTERECTOMY      Family History  Problem Relation Age of Onset   Hypertension Mother    Breast cancer Maternal Aunt 70 - 49   Hypertension Maternal Grandmother    Lupus Maternal Grandmother    Heart failure Maternal Grandmother    Stroke Maternal Grandmother    CAD Maternal Grandmother    Adrenal disorder Neg Hx     Social History   Socioeconomic History   Marital status: Married    Spouse name: Not on file   Number of children: Not on file   Years of education: Not on file   Highest education level: GED or equivalent  Occupational History   Not on file  Tobacco Use   Smoking status: Every Day    Current packs/day: 1.00    Average packs/day: 1 pack/day for 32.1 years (32.1 ttl pk-yrs)    Types: Cigarettes    Start date: 01/26/1991   Smokeless tobacco: Never   Tobacco comments:    Patient is participating in health coaching for smoking cessation.   Vaping Use   Vaping status: Never Used  Substance and Sexual Activity   Alcohol use: No   Drug use: No   Sexual activity: Yes    Birth control/protection: None  Other Topics Concern   Not on file  Social History Narrative   Right handed      Highest level of edu- 12th grade      7 children      Apartment- 2nd floor   Social Drivers of Health   Financial Resource Strain: High Risk (03/03/2023)   Overall Financial Resource Strain (CARDIA)    Difficulty of Paying Living Expenses: Hard  Food Insecurity: Food Insecurity Present (03/03/2023)   Hunger Vital Sign    Worried About Running Out  of Food in the Last Year: Sometimes true    Ran Out of Food in the Last Year: Sometimes true  Transportation Needs: No Transportation Needs (03/03/2023)   PRAPARE - Administrator, Civil Service (Medical): No    Lack of Transportation (Non-Medical): No  Physical Activity: Unknown (03/03/2023)   Exercise Vital Sign    Days of Exercise per Week: 0 days    Minutes of Exercise per Session: Not on file  Stress: Stress Concern Present (03/03/2023)   [employer] of Occupational Health - Occupational Stress Questionnaire    Feeling of Stress : Very much  Social Connections: Moderately Isolated (03/03/2023)   Social Connection and Isolation Panel [  NHANES]    Frequency of Communication with Friends and Family: More than three times a week    Frequency of Social Gatherings with Friends and Family: Never    Attends Religious Services: Never    Database Administrator or Organizations: No    Attends Engineer, Structural: Not on file    Marital Status: Married  Catering Manager Violence: Not At Risk (03/03/2023)   Humiliation, Afraid, Rape, and Kick questionnaire    Fear of Current or Ex-Partner: No    Emotionally Abused: No    Physically Abused: No    Sexually Abused: No    Review of Systems  Psychiatric/Behavioral:  Positive for depression. Negative for suicidal ideas. The patient is nervous/anxious and has insomnia.   All other systems reviewed and are negative.       Objective    BP (!) 170/132 (BP Location: Right Arm, Patient Position: Sitting, Cuff Size: Normal)   Pulse 86   Temp 97.9 F (36.6 C) (Oral)   Resp 18   Ht 5' 4 (1.626 m)   Wt 162 lb 9.6 oz (73.8 kg)   SpO2 97%   BMI 27.91 kg/m   Physical Exam Vitals and nursing note reviewed.  Constitutional:      General: She is not in acute distress. Cardiovascular:     Rate and Rhythm: Normal rate and regular rhythm.  Pulmonary:     Effort: Pulmonary effort is normal.     Breath sounds: Normal breath  sounds.  Abdominal:     Palpations: Abdomen is soft.     Tenderness: There is no abdominal tenderness.  Neurological:     General: No focal deficit present.     Mental Status: She is alert and oriented to person, place, and time.  Psychiatric:        Mood and Affect: Mood is anxious and depressed. Affect is tearful.         Assessment & Plan:  1. Uncontrolled hypertension (Primary) Elevated readings. Discussed compliance. Referred to nephrology for further eval/mgt. - Ambulatory referral to Nephrology  2. Anxiety and depression Mirtazapine  prescribed.  3. Insomnia, unspecified type As above  4. Smoking Discussed reduction/cessation  5. Immunization due  - Tdap vaccine greater than or equal to 7yo IM    Return in about 4 weeks (around 03/31/2023) for follow up.   Tanda Raguel SQUIBB, MD

## 2023-03-07 ENCOUNTER — Encounter: Payer: Self-pay | Admitting: Family Medicine

## 2023-03-17 ENCOUNTER — Encounter (HOSPITAL_BASED_OUTPATIENT_CLINIC_OR_DEPARTMENT_OTHER): Payer: 59 | Admitting: Cardiovascular Disease

## 2023-03-21 ENCOUNTER — Ambulatory Visit (HOSPITAL_BASED_OUTPATIENT_CLINIC_OR_DEPARTMENT_OTHER): Payer: 59 | Admitting: Cardiovascular Disease

## 2023-03-21 ENCOUNTER — Encounter (HOSPITAL_BASED_OUTPATIENT_CLINIC_OR_DEPARTMENT_OTHER): Payer: Self-pay | Admitting: Cardiovascular Disease

## 2023-03-21 VITALS — BP 196/122 | HR 105 | Ht 64.0 in | Wt 167.7 lb

## 2023-03-21 DIAGNOSIS — I1A0 Resistant hypertension: Secondary | ICD-10-CM | POA: Diagnosis not present

## 2023-03-21 DIAGNOSIS — Z87891 Personal history of nicotine dependence: Secondary | ICD-10-CM

## 2023-03-21 DIAGNOSIS — G4733 Obstructive sleep apnea (adult) (pediatric): Secondary | ICD-10-CM

## 2023-03-21 MED ORDER — DOXAZOSIN MESYLATE 4 MG PO TABS: 4.0000 mg | ORAL_TABLET | Freq: Every day | ORAL | 3 refills | Status: DC

## 2023-03-21 NOTE — Progress Notes (Signed)
 Advanced [facility]pertension [facility] Follow-up:  Date:  03/21/2023   ID:  Denise Carter, DOB 11-Aug-1970, MRN [id number]  PCP:  Georganna Skeans, MD  Cardiologist:   Chilton Si, MD   No chief complaint on file.    History of Present Illness:  Denise Carter is a 53 y.o. female with hypertension, OSA on CPAP, SLE, rheumatoid arthritis, polyclonal gammopathy, Sjogren's disease, and tobacco abuse here for follow-up.  She first found out she had hypertension in 2015 when she was diagnosed with SLE.  Her BP had never been well-controlled.  She previously saw Dr. Eden Emms 12/2017 for hypertension.  Her blood pressure at that time was around 150 systolic.  She was referred for renal artery Dopplers that were normal.  Labetalol was increased to 300 mg twice daily.  She was seen in the ED 12/2018 with headache and sinus congestion.  Her blood pressure at that time was 192/131.  She worked at [employer] and walked a lot at work.  She had random episodes of chest pain and diaphoresis.  The pain was on her R side and under her R breast.  It started sharp and then subsided, sometimes associated with heart racing.  Occurred mostly at rest and regularly with exertion.  She did not complete formal exercise outside of work.  She started noticing shortness of breath when walking.  She continued to smoke 1 pack of cigarettes daily.  In the past she tried quitting with Chantix, gum, and she thinks she used Wellbutrin but was willing to try again.  There was concern for pheochromocytoma.  Plasma catecholamines and metanephrines were negative.  [name] had been under a lot of stress because of her son.  He was still living in [place] and often into a lot of trouble with the law.  She was having some chest pain and was referred for coronary CT-A 05/2019 that revealed normal coronary arteries.  She had shortness of breath and had an echo that revealed LVEF 60 to 65% with grade 2 diastolic dysfunction.  Right atrial pressure  was 3 mmHg.  Testing we negative for hyperaldosteronism.  She was referred to endocrinology and they did not feel her labs were consistent with Cushing syndrome.  She was referred for sleep study and was found to have sleep apnea.  CPAP was recommended.  She underwent multiple medication changes and had been working with our pharmacist.  Valsartan was switched to irbesartan.  She was referred to PREP for exercise but unable to participate due to her work schedule.  She saw our pharmacist on 09/2019.  At that time her blood pressure was 170/112.  Spironolactone was increased to 50 mg.  However she did not understand this change and had only been taking 25 mg.  Renal function and potassium remained stable with this change.  She noted facial droop and was referred for CT of the head that was normal.  She was trying to cut back on smoking and reduce stress.  Her arthritis medication was changed and her symptoms haven't been well-controlled.  She noted that her BP goes up when she is stressed.  Her lifting at work aggravated her joints and they get hot and swollen at the end of the day.  She had a poor appetite and wasn't eating much.  She didn't have much salt in her diet.  She noted pain in her legs walking at work.   She has struggled to tolerate her CPAP and is interested in the [place] device.  She had an Echo 04/2021 with LVEF 60-65%, mild LVH, and grade 1 diastolic dysfunction. She requested to see pulmonary due to her shortness of breath. She continues to smoke about 1 PPD of cigarettes daily. CT showed lung nodules and emphysema, they recommended increasing her Advair to twice a day. She last saw [facility] 10/2021 and blood pressure remained uncontrolled. She was taking carvedilol once a day and 100 mg spironolactone rather than 200 mg. [name] recommended increasing her medications to the prescribed doses.  Ms. Arterburn was started on clonidine and she was referred for renal denervation with Dr. Kirke Corin.  However  her insurance would not cover the procedure.  At her visit 03/2022 she was off her RA meds due to not having a rheumatologist.  She was continuing to smoke.  Denise Carter presents with uncontrolled blood pressure and breathing difficulties.  She experiences uncontrolled blood pressure despite being on maximum doses of multiple antihypertensive medications, including spironolactone, irbesartan, clonidine, carvedilol, and hydrochlorothiazide. Her blood pressure readings range from 150 to 180 mmHg, with a recent reading of 180 mmHg in the morning before taking medication. She recalls a past issue where her body would 'dump all my medication,' leading to ineffective treatment. She experiences headaches, typically relieved by coffee.  She has persistent COPD symptoms, particularly shortness of breath, despite medication use. She describes episodes of breathlessness upon exertion and notes that her breathing was particularly difficult on Friday and Saturday, though it has improved by today. She experiences frequent coughing spells, sometimes severe enough to cause urinary incontinence, triggered by irritants like perfume. She reports sleep disturbances, waking up choking, and has been using a wedge pillow to alleviate these symptoms, which has helped reduce nighttime choking episodes.  Significant stress related to her husband's drinking habits exacerbates her smoking habits. She smokes about a pack a day, sometimes more when stressed. She has been trying to manage her stress by avoiding confrontations and not engaging in arguments. She has family support but lacks professional counseling.  She consumes about four to five cups of coffee daily, noting a preference for stronger coffee to alleviate her headaches. Her diet is limited, often eating only one meal a day, and she avoids pork and beef due to digestive issues.      Previous antihypertensives: Lisinopril HCTZ Verapamil Amlodipine -  headache labetalolol- didn't work Atenolol worked some in [place]. Hydralazine - headaches  Past Medical History:  Diagnosis Date   Atypical chest pain 05/23/2019   Essential hypertension 05/23/2019   Hypertension    Lupus    OSA (obstructive sleep apnea) 11/19/2019   Resistant hypertension 05/23/2019   Shortness of breath 05/23/2019   Snoring 05/23/2019   Thyroid disease    Varicose veins of left lower extremity 01/26/2020    Past Surgical History:  Procedure Laterality Date   ABDOMINAL HYSTERECTOMY       Current Outpatient Medications  Medication Sig Dispense Refill   albuterol (VENTOLIN HFA) 108 (90 Base) MCG/ACT inhaler INHALE 2 PUFFS BY MOUTH INTO THE LUNGS EVERY 6 HOURS AS NEEDED FOR WHEEZING OR SHORTNESS OF BREATH 8.5 g 2   amLODipine (NORVASC) 10 MG tablet Take 1 tablet (10 mg total) by mouth daily. 90 tablet 0   aspirin (EC-81 ASPIRIN) 81 MG EC tablet Take 1 tablet (81 mg total) by mouth daily. Swallow whole. 30 tablet 12   atorvastatin (LIPITOR) 40 MG tablet Take 1 tablet (40 mg total) by mouth daily. 90 tablet 1   benzonatate (TESSALON) 200 MG  capsule Take 1 capsule (200 mg total) by mouth 3 (three) times daily as needed. 45 capsule 1   Budeson-Glycopyrrol-Formoterol (BREZTRI AEROSPHERE) 160-9-4.8 MCG/ACT AERO Inhale 2 puffs into the lungs in the morning and at bedtime. 10.7 g 3   chlorthalidone (HYGROTON) 25 MG tablet Take 1 tablet (25 mg total) by mouth daily. 90 tablet 1   cloNIDine (CATAPRES - DOSED IN MG/24 HR) 0.2 mg/24hr patch Place 1 patch (0.2 mg total) onto the skin once a week. 4 patch 5   doxazosin (CARDURA) 4 MG tablet Take 1 tablet (4 mg total) by mouth daily. 90 tablet 3   fluticasone (FLONASE) 50 MCG/ACT nasal spray Place 2 sprays into both nostrils daily. 91 g 1   ibuprofen (ADVIL) 200 MG tablet Take 200 mg by mouth every 6 (six) hours as needed.     irbesartan (AVAPRO) 300 MG tablet Take 1 tablet (300 mg total) by mouth daily. 90 tablet 1   KLS  OMPERAZOLE 20 MG TBEC Take 1 tablet by mouth daily.     mirtazapine (REMERON) 7.5 MG tablet Take 1 tablet (7.5 mg total) by mouth at bedtime. 30 tablet 1   pantoprazole (PROTONIX) 20 MG tablet Take 1 tablet (20 mg total) by mouth daily. 90 tablet 3   predniSONE (DELTASONE) 10 MG tablet 4 tabs for 2 days, then 3 tabs for 2 days, 2 tabs for 2 days, then 1 tab for 2 days, then stop 20 tablet 0   pregabalin (LYRICA) 200 MG capsule Take 1 capsule (200 mg total) by mouth 2 (two) times daily. 60 capsule 0   spironolactone (ALDACTONE) 100 MG tablet Take 2 tablets (200 mg total) by mouth daily. 180 tablet 1   varenicline (CHANTIX CONTINUING MONTH PAK) 1 MG tablet Take 1 tablet (1 mg total) by mouth 2 (two) times daily. 60 tablet 2   varenicline (CHANTIX) 0.5 MG tablet Take 1 tablet (0.5 mg total) by mouth 2 (two) times daily. 0.5 mg daily for 3 days then 0.5 mg twice a day for 4 days 14 tablet 0   Vitamin D, Ergocalciferol, (DRISDOL) 1.25 MG (50000 UNIT) CAPS capsule Take 1 capsule (50,000 Units total) by mouth every 7 (seven) days. 12 capsule 0   carvedilol (COREG) 25 MG tablet Take 1 tablet (25 mg total) by mouth 2 (two) times daily. 180 tablet 1   No current facility-administered medications for this visit.    Allergies:   Labetalol    Social History:  The patient  reports that she has been smoking cigarettes. She started smoking about 32 years ago. She has a 32.1 pack-year smoking history. She has never used smokeless tobacco. She reports that she does not drink alcohol and does not use drugs.   Family History:  The patient's family history includes Breast cancer (age of onset: 17 - 42) in her maternal aunt; CAD in her maternal grandmother; Heart failure in her maternal grandmother; Hypertension in her maternal grandmother and mother; Lupus in her maternal grandmother; Stroke in her maternal grandmother.    ROS:   Please see the history of present illness.    (+) Myalgias (+) Urinary  frequency All other systems are reviewed and negative.    PHYSICAL EXAM: VS:  BP (!) 196/122   Pulse (!) 105   Ht 5\' 4"  (1.626 m)   Wt 167 lb 11.2 oz (76.1 kg)   SpO2 96%   BMI 28.79 kg/m  , BMI Body mass index is 28.79 kg/m. GENERAL:  Well  appearing HEENT: Pupils equal round and reactive, fundi not visualized, oral mucosa unremarkable NECK:  No jugular venous distention, waveform within normal limits, carotid upstroke brisk and symmetric, no bruits LUNGS:  Clear to auscultation bilaterally HEART:  RRR.  PMI not displaced or sustained,S1 and S2 within normal limits, no S3, no S4, no clicks, no rubs, no murmurs ABD:  Flat, positive bowel sounds normal in frequency in pitch, no bruits, no rebound, no guarding, no midline pulsatile mass, no hepatomegaly, no splenomegaly EXT:  1 plus pulses throughout, no edema, no cyanosis no clubbing SKIN:  No rashes no nodules NEURO:  Cranial nerves II through XII grossly intact, motor grossly intact throughout PSYCH:  Cognitively intact, oriented to person place and time  EKG:  EKG is personally reviewed. 04/14/2022:  EKG was not ordered. 03/12/2022:  Sinus rhythm. Rate 85 bpm. LAFB. LVH. 05/23/2019: sinus rhythm.  Rate 82 bpm.  Nonspecific ST changes.  LAFB.  Cannot rule out anteroseptal infarct    CT Chest  05/22/2021: IMPRESSION: Lung-RADS 2, benign appearance or behavior. Continue annual screening with low-dose chest CT without contrast in 12 months.   Aortic Atherosclerosis (ICD10-I70.0) and Emphysema (ICD10-J43.9).  Echo  05/15/2021:  1. Left ventricular ejection fraction, by estimation, is 60 to 65%. Left  ventricular ejection fraction by 3D volume is 59 %. The left ventricle has  normal function. The left ventricle has no regional wall motion  abnormalities. There is mild concentric  left ventricular hypertrophy. Left ventricular diastolic parameters are  consistent with Grade I diastolic dysfunction (impaired relaxation).   2. Right  ventricular systolic function is normal. The right ventricular  size is normal. There is normal pulmonary artery systolic pressure. The  estimated right ventricular systolic pressure is 22.9 mmHg.   3. Left atrial size was moderately dilated.   4. The mitral valve is grossly normal. No evidence of mitral valve  regurgitation. The mean mitral valve gradient is 1.0 mmHg.   5. The aortic valve is tricuspid. Aortic valve regurgitation is mild. No  aortic stenosis is present.   6. The inferior vena cava is normal in size with greater than 50%  respiratory variability, suggesting right atrial pressure of 3 mmHg.   Comparison(s): No significant change from prior study. EF 60%.   Echo 05/2019: 1. Left ventricular ejection fraction, by estimation, is 60 to 65%. The  left ventricle has normal function. The left ventricle has no regional  wall motion abnormalities. There is mild concentric left ventricular  hypertrophy. Left ventricular diastolic  parameters are consistent with Grade II diastolic dysfunction  (pseudonormalization). Elevated left ventricular end-diastolic pressure.   2. Right ventricular systolic function is normal. The right ventricular  size is normal. There is normal pulmonary artery systolic pressure.   3. Left atrial size was mildly dilated.   4. The mitral valve is normal in structure. Trivial mitral valve  regurgitation. No evidence of mitral stenosis.   5. The aortic valve is tricuspid. Aortic valve regurgitation is not  visualized. No aortic stenosis is present.   6. Aortic dilatation noted. There is mild dilatation of the ascending  aorta measuring 38 mm.   7. The inferior vena cava is normal in size with greater than 50%  respiratory variability, suggesting right atrial pressure of 3 mmHg.   Recent Labs: 09/16/2022: ALT 7; BUN 9; Creatinine, Ser 0.84; Hemoglobin 12.9; Platelets 166; Potassium 4.3; Sodium 142; TSH 1.600    Lipid Panel    Component Value Date/Time    CHOL  165 09/16/2022 1112   TRIG 83 09/16/2022 1112   HDL 56 09/16/2022 1112   CHOLHDL 2.9 09/16/2022 1112   LDLCALC 93 09/16/2022 1112    Wt Readings from Last 3 Encounters:  03/21/23 167 lb 11.2 oz (76.1 kg)  03/03/23 162 lb 9.6 oz (73.8 kg)  02/09/23 167 lb (75.8 kg)      ASSESSMENT AND PLAN:    # Resistant Hypertension Uncontrolled despite maximum doses of multiple antihypertensives. Patient reports high stress levels and frequent coffee intake. -Discontinue Tamsulosin and start Doxazosin to potentially aid in blood pressure control. -Encourage lifestyle modifications including reducing caffeine intake to no more than two servings per day, increasing exercise to at least 150 minutes per week, and reducing salt intake. -Consider referral to a professional counselor to help manage stress levels.  She is not interested at this time.  Prefers to keep talking with family.  -Follow up in 4 months to reassess blood pressure control. - Of note, after patient left the office, it was noted that many of her antihypertensives have not been filled since 06/2022 at [facility].  Will call pharmacy to verify this information.   # Tobacco abuse:  Discussed how smoking is contributing to her uncontrolled BP.  She is not ready to quit.    # COPD Patient reports increased coughing spells and episodes of choking during sleep. -Continue current management and monitor symptoms.  # Urinary Incontinence Patient reports urinary leakage during coughing spells. -Referral to Urology has been made, awaiting appointment.  # OSA:  Patient reports poor sleep quality and choking during sleep.  She declines CPAP.  Wants Inspire but insurance doesn't pay for it.  -Encourage use of wedge pillow to improve sleep quality. -Continue Mirtazapine 7.5mg  for sleep and stress management, with potential dose adjustment if not effective.  # General Health Maintenance -Encourage smoking cessation. -Consider dietary  modifications to improve overall health and potentially aid in blood pressure control.      Screening for Secondary Hypertension:      Relevant Labs/Studies:    Latest Ref Rng & Units 09/16/2022   11:12 AM 02/16/2022   11:42 AM 03/04/2021    9:39 AM  Basic Labs  Sodium 134 - 144 mmol/L 142  144  141   Potassium 3.5 - 5.2 mmol/L 4.3  3.7  4.7   Creatinine 0.57 - 1.00 mg/dL 7.25  3.66  4.40        Latest Ref Rng & Units 09/16/2022   11:12 AM 06/05/2020    7:56 AM  Thyroid   TSH 0.450 - 4.500 uIU/mL 1.600  1.82          Latest Ref Rng & Units 07/23/2019    2:04 PM 05/23/2019    9:25 AM  Metanephrines/Catecholamines   Epinephrine pg/mL <20    Norepinephrine pg/mL 319    Dopamine pg/mL <10    Metanephrines <=57 pg/mL 56  51.8   Normetanephrines  <=148 pg/mL 84  103.3           01/23/2018   12:26 PM  Renovascular   Renal Artery [place] Completed Yes   Medication Adjustments/Labs and Tests Ordered: Current medicines are reviewed at length with the patient today.  Concerns regarding medicines are outlined above.   No orders of the defined types were placed in this encounter.  Meds ordered this encounter  Medications   doxazosin (CARDURA) 4 MG tablet    Sig: Take 1 tablet (4 mg total) by mouth daily.  Dispense:  90 tablet    Refill:  3    D/C tamsulosin   Patient Instructions  Medication Instructions:  STOP TAMSULOSIN    START DOXAZOSIN 4 MG DAILY   Labwork: NONE  Testing/Procedures: NONE  Follow-Up: 4 MONTHS WITH DR Yizel Canby OR [name] W NP   If you need a refill on your cardiac medications before your next appointment, please call your pharmacy.   Signed, Filipe Greathouse C. Duke Salvia, MD, [facility]  03/21/2023 4:00 PM    [facility]

## 2023-03-21 NOTE — Patient Instructions (Addendum)
 Medication Instructions:  STOP TAMSULOSIN    START DOXAZOSIN 4 MG DAILY   Labwork: NONE  Testing/Procedures: NONE  Follow-Up: 4 MONTHS WITH DR Parcelas Mandry OR [name] W NP   If you need a refill on your cardiac medications before your next appointment, please call your pharmacy.

## 2023-04-01 ENCOUNTER — Ambulatory Visit: Payer: 59 | Admitting: Family Medicine

## 2023-04-21 ENCOUNTER — Telehealth: Payer: Self-pay | Admitting: Pulmonary Disease

## 2023-04-25 NOTE — Telephone Encounter (Signed)
 Received paperwork from [employer]. Put in Dr. Trena Platt box.

## 2023-04-26 ENCOUNTER — Encounter: Payer: Self-pay | Admitting: Pulmonary Disease

## 2023-05-05 ENCOUNTER — Other Ambulatory Visit: Payer: Self-pay | Admitting: Pulmonary Disease

## 2023-05-10 NOTE — Telephone Encounter (Signed)
 Tried calling patient to see if she would be like to sign her FMLA paper work at her next appointment (05/17/23). It has been signed by the doctor but is missing her name and signature .

## 2023-05-17 ENCOUNTER — Ambulatory Visit (INDEPENDENT_AMBULATORY_CARE_PROVIDER_SITE_OTHER): Payer: 59 | Admitting: Pulmonary Disease

## 2023-05-17 ENCOUNTER — Encounter: Payer: Self-pay | Admitting: Pulmonary Disease

## 2023-05-17 VITALS — BP 195/143 | HR 92 | Ht 64.0 in | Wt 161.0 lb

## 2023-05-17 DIAGNOSIS — Z87891 Personal history of nicotine dependence: Secondary | ICD-10-CM

## 2023-05-17 NOTE — Telephone Encounter (Signed)
 FMLA paperwork has been signed by patient and successfully faxed to [employer]. One copy will be sent to scan and the other will be in the brown accordion folder.

## 2023-05-17 NOTE — Progress Notes (Signed)
 Denise Carter    [id number]    September 21, 1970  Primary Care Physician:Wilson, Ray Caffey, MD  Referring Physician: Abraham Abo, MD [address] [place],  [place] [address]  Chief complaint:   Patient is being seen for shortness of breath  HPI:  Has been stable since last visit  She still has a cough, shortness of breath with activity  Has been sleeping more upright which helps her reflux Continues to take her PPI  Not feeling acutely ill, shortness of breath with almost all activities No fever, no chills  Continues to tolerate Breztri   Has an appointment to see nephrology for hypertension  History of mild obstructive sleep apnea Sjogren's syndrome Chronic fatigue syndrome Polyneuropathy due to lupus History of rheumatoid arthritis Psoriatic arthritis Polyclonal gammopathy  Recent sleep study does reveal mild obstructive sleep apnea  No pertinent occupational history  Recent CT with lung nodules and emphysema  Outpatient Encounter Medications as of 05/17/2023  Medication Sig   albuterol  (VENTOLIN  HFA) 108 (90 Base) MCG/ACT inhaler INHALE 2 PUFFS BY MOUTH INTO THE LUNGS EVERY 6 HOURS AS NEEDED FOR WHEEZING OR SHORTNESS OF BREATH   amLODipine  (NORVASC ) 10 MG tablet Take 1 tablet (10 mg total) by mouth daily.   aspirin  (EC-81 ASPIRIN ) 81 MG EC tablet Take 1 tablet (81 mg total) by mouth daily. Swallow whole.   atorvastatin  (LIPITOR) 40 MG tablet Take 1 tablet (40 mg total) by mouth daily.   benzonatate  (TESSALON ) 200 MG capsule Take 1 capsule (200 mg total) by mouth 3 (three) times daily as needed.   BREZTRI  AEROSPHERE 160-9-4.8 MCG/ACT AERO inhaler Inhale 2 puffs by mouth into the lungs in the morning and at bedtime.   chlorthalidone  (HYGROTON ) 25 MG tablet Take 1 tablet (25 mg total) by mouth daily.   cloNIDine  (CATAPRES  - DOSED IN MG/24 HR) 0.2 mg/24hr patch Place 1 patch (0.2 mg total) onto the skin once a week.   doxazosin  (CARDURA ) 4 MG  tablet Take 1 tablet (4 mg total) by mouth daily.   fluticasone  (FLONASE ) 50 MCG/ACT nasal spray Place 2 sprays into both nostrils daily.   ibuprofen (ADVIL) 200 MG tablet Take 200 mg by mouth every 6 (six) hours as needed.   irbesartan  (AVAPRO ) 300 MG tablet Take 1 tablet (300 mg total) by mouth daily.   KLS OMPERAZOLE 20 MG TBEC Take 1 tablet by mouth daily.   mirtazapine  (REMERON ) 7.5 MG tablet Take 1 tablet (7.5 mg total) by mouth at bedtime.   pantoprazole  (PROTONIX ) 20 MG tablet Take 1 tablet (20 mg total) by mouth daily.   predniSONE  (DELTASONE ) 10 MG tablet 4 tabs for 2 days, then 3 tabs for 2 days, 2 tabs for 2 days, then 1 tab for 2 days, then stop   pregabalin  (LYRICA ) 200 MG capsule Take 1 capsule (200 mg total) by mouth 2 (two) times daily.   spironolactone  (ALDACTONE ) 100 MG tablet Take 2 tablets (200 mg total) by mouth daily.   Vitamin D , Ergocalciferol , (DRISDOL ) 1.25 MG (50000 UNIT) CAPS capsule Take 1 capsule (50,000 Units total) by mouth every 7 (seven) days.   carvedilol  (COREG ) 25 MG tablet Take 1 tablet (25 mg total) by mouth 2 (two) times daily.   varenicline  (CHANTIX  CONTINUING MONTH PAK) 1 MG tablet Take 1 tablet (1 mg total) by mouth 2 (two) times daily.   varenicline  (CHANTIX ) 0.5 MG tablet Take 1 tablet (0.5 mg total) by mouth 2 (two) times daily.  0.5 mg daily for 3 days then 0.5 mg twice a day for 4 days   No facility-administered encounter medications on file as of 05/17/2023.    Allergies as of 05/17/2023 - Review Complete 05/17/2023  Allergen Reaction Noted   Labetalol  Anxiety 10/25/2018    Past Medical History:  Diagnosis Date   Atypical chest pain 05/23/2019   Essential hypertension 05/23/2019   Hypertension    Lupus    OSA (obstructive sleep apnea) 11/19/2019   Resistant hypertension 05/23/2019   Shortness of breath 05/23/2019   Snoring 05/23/2019   Thyroid  disease    Varicose veins of left lower extremity 01/26/2020    Past Surgical History:   Procedure Laterality Date   ABDOMINAL HYSTERECTOMY      Family History  Problem Relation Age of Onset   Hypertension Mother    Breast cancer Maternal Aunt 38 - 49   Hypertension Maternal Grandmother    Lupus Maternal Grandmother    Heart failure Maternal Grandmother    Stroke Maternal Grandmother    CAD Maternal Grandmother    Adrenal disorder Neg Hx     Social History   Socioeconomic History   Marital status: Married    Spouse name: Not on file   Number of children: Not on file   Years of education: Not on file   Highest education level: GED or equivalent  Occupational History   Not on file  Tobacco Use   Smoking status: Every Day    Current packs/day: 1.00    Average packs/day: 1 pack/day for 32.3 years (32.3 ttl pk-yrs)    Types: Cigarettes    Start date: 01/26/1991   Smokeless tobacco: Never   Tobacco comments:    Patient is participating in health coaching for smoking cessation.   Vaping Use   Vaping status: Never Used  Substance and Sexual Activity   Alcohol use: No   Drug use: No   Sexual activity: Yes    Birth control/protection: None  Other Topics Concern   Not on file  Social History Narrative   Right handed      Highest level of edu- 12th grade      7 children      Apartment- 2nd floor   Social Drivers of Health   Financial Resource Strain: High Risk (03/03/2023)   Overall Financial Resource Strain (CARDIA)    Difficulty of Paying Living Expenses: Hard  Food Insecurity: Food Insecurity Present (03/03/2023)   Hunger Vital Sign    Worried About Running Out of Food in the Last Year: Sometimes true    Ran Out of Food in the Last Year: Sometimes true  Transportation Needs: No Transportation Needs (03/03/2023)   PRAPARE - Administrator, Civil Service (Medical): No    Lack of Transportation (Non-Medical): No  Physical Activity: Unknown (03/03/2023)   Exercise Vital Sign    Days of Exercise per Week: 0 days    Minutes of Exercise per  Session: Not on file  Stress: Stress Concern Present (03/03/2023)   [employer] of Occupational Health - Occupational Stress Questionnaire    Feeling of Stress : Very much  Social Connections: Moderately Isolated (03/03/2023)   Social Connection and Isolation Panel [NHANES]    Frequency of Communication with Friends and Family: More than three times a week    Frequency of Social Gatherings with Friends and Family: Never    Attends Religious Services: Never    Database administrator or Organizations: No  Attends Banker Meetings: Not on file    Marital Status: Married  Intimate Partner Violence: Not At Risk (03/03/2023)   Humiliation, Afraid, Rape, and Kick questionnaire    Fear of Current or Ex-Partner: No    Emotionally Abused: No    Physically Abused: No    Sexually Abused: No    Review of Systems  Constitutional:  Positive for fatigue.  Respiratory:  Positive for cough and shortness of breath.   Psychiatric/Behavioral:  Positive for sleep disturbance.     Vitals:   05/17/23 1016  BP: (!) 195/143  Pulse: 92  SpO2: 98%     Physical Exam Constitutional:      Appearance: She is obese.  HENT:     Head: Normocephalic.     Mouth/Throat:     Mouth: Mucous membranes are moist.  Eyes:     General: No scleral icterus.    Pupils: Pupils are equal, round, and reactive to light.  Cardiovascular:     Rate and Rhythm: Normal rate and regular rhythm.     Heart sounds: No murmur heard.    No friction rub.  Pulmonary:     Effort: No respiratory distress.     Breath sounds: No stridor. No wheezing or rhonchi.  Musculoskeletal:     Cervical back: No rigidity or tenderness.  Neurological:     Mental Status: She is alert.  Psychiatric:        Mood and Affect: Mood normal.    Data Reviewed: CT with 3 mm lung nodule, emphysema 05/22/2021-reviewed by myself-CT scan was reviewed with the patient during the visit  PFT reviewed with the patient showing mild  obstructive disease, air trapping suggestive of emphysema with no significant bronchodilator response  Assessment:  Emphysema  Chronic obstructive pulm disease -Stable on Breztri   History of lupus, rheumatoid arthritis Chronic cough  Reflux   Plan/Recommendations:  Continue albuterol  as needed  Continue Breztri   Encouraged to call with significant concerns  Tessalon  Perles as needed  Follow-up in about 6 months  Paperwork for FMLA filled out  Myer Artis MD [facility] 05/17/2023, 10:23 AM  CC: Abraham Abo, MD

## 2023-05-17 NOTE — Patient Instructions (Addendum)
 Follow-up in about 6 months  Continue using Breztri   Use albuterol  as needed  Call us  with significant concerns  Continue with trying to sleep as upright as you can tolerate

## 2023-05-19 ENCOUNTER — Ambulatory Visit (HOSPITAL_BASED_OUTPATIENT_CLINIC_OR_DEPARTMENT_OTHER)

## 2023-05-21 ENCOUNTER — Ambulatory Visit (HOSPITAL_BASED_OUTPATIENT_CLINIC_OR_DEPARTMENT_OTHER)
Admission: RE | Admit: 2023-05-21 | Discharge: 2023-05-21 | Disposition: A | Discharge status: Home / Self Care | Source: Ambulatory Visit | Attending: Pulmonary Disease

## 2023-05-21 DIAGNOSIS — Z87891 Personal history of nicotine dependence: Secondary | ICD-10-CM | POA: Insufficient documentation

## 2023-06-01 ENCOUNTER — Other Ambulatory Visit: Payer: Self-pay | Admitting: Family Medicine

## 2023-06-01 MED ORDER — PREGABALIN 200 MG PO CAPS
200.0000 mg | ORAL_CAPSULE | Freq: Two times a day (BID) | ORAL | 0 refills | Status: DC
Start: 2023-06-01 — End: 2023-10-12

## 2023-06-03 ENCOUNTER — Encounter: Payer: Self-pay | Admitting: Pulmonary Disease

## 2023-06-06 NOTE — Telephone Encounter (Signed)
 Front, have you received this back?

## 2023-06-06 NOTE — Telephone Encounter (Signed)
 Form has been successfully faxed with the updated information as of 06/06/23.

## 2023-06-08 ENCOUNTER — Encounter: Payer: Self-pay | Admitting: Family Medicine

## 2023-06-16 NOTE — Telephone Encounter (Signed)
 Did you want her to follow up after her scan?

## 2023-06-17 ENCOUNTER — Telehealth: Payer: Self-pay

## 2023-06-17 NOTE — Telephone Encounter (Signed)
 Copied from CRM [id number]. Topic: Clinical - Lab/Test Results >> Jun 16, 2023  4:49 PM Jethro Morrison wrote: Reason for CRM: PT IS CALLING BACK STATED SHE CALLED THIS MORNING REQUESTING A NURSE TO CALL HER BACK BUT NO ONE RETURNED HER CALL. SHE IS PANICKING ABOUT HER RESULTS FROM HER CT THAT SHE GO ON MY CHART BUT NO ONE HAS CALLED HER TO DISCUSS.    Spoke w/ PT and explained to her Dr.O has yet to review the CT, PT verbalized understanding.

## 2023-06-28 ENCOUNTER — Other Ambulatory Visit: Payer: Self-pay | Admitting: Family

## 2023-06-28 ENCOUNTER — Ambulatory Visit (HOSPITAL_BASED_OUTPATIENT_CLINIC_OR_DEPARTMENT_OTHER): Payer: 59 | Admitting: Cardiovascular Disease

## 2023-06-28 ENCOUNTER — Other Ambulatory Visit: Payer: Self-pay | Admitting: Family Medicine

## 2023-06-28 ENCOUNTER — Encounter (HOSPITAL_BASED_OUTPATIENT_CLINIC_OR_DEPARTMENT_OTHER): Payer: Self-pay | Admitting: Cardiovascular Disease

## 2023-06-28 ENCOUNTER — Telehealth: Payer: Self-pay | Admitting: Licensed Clinical Social Worker

## 2023-06-28 VITALS — BP 162/118 | HR 99 | Ht 64.0 in | Wt 166.0 lb

## 2023-06-28 DIAGNOSIS — I1A0 Resistant hypertension: Secondary | ICD-10-CM

## 2023-06-28 DIAGNOSIS — E785 Hyperlipidemia, unspecified: Secondary | ICD-10-CM

## 2023-06-28 DIAGNOSIS — F32A Depression, unspecified: Secondary | ICD-10-CM

## 2023-06-28 DIAGNOSIS — F439 Reaction to severe stress, unspecified: Secondary | ICD-10-CM | POA: Diagnosis not present

## 2023-06-28 HISTORY — DX: Depression, unspecified: F32.A

## 2023-06-28 MED ORDER — ATORVASTATIN CALCIUM 40 MG PO TABS: 40.0000 mg | ORAL_TABLET | Freq: Every day | ORAL | 0 refills | Status: DC

## 2023-06-28 MED ORDER — HYDRALAZINE HCL 50 MG PO TABS: 50.0000 mg | ORAL_TABLET | Freq: Three times a day (TID) | ORAL | 3 refills | Status: DC

## 2023-06-28 NOTE — Telephone Encounter (Signed)
 Refilled 06/28/23. Requested Prescriptions  Refused Prescriptions Disp Refills   atorvastatin  (LIPITOR) 40 MG tablet [Pharmacy Med Name: Atorvastatin  Calcium  Oral Tablet 40 MG] 90 tablet 0    Sig: TAKE ONE TABLET BY MOUTH ONE TIME DAILY     Cardiovascular:  Antilipid - Statins Failed - 06/28/2023  2:30 PM      Failed - Lipid Panel in normal range within the last 12 months    Cholesterol, Total  Date Value Ref Range Status  09/16/2022 165 100 - 199 mg/dL Final   LDL Chol Calc (NIH)  Date Value Ref Range Status  09/16/2022 93 0 - 99 mg/dL Final   HDL  Date Value Ref Range Status  09/16/2022 56 >39 mg/dL Final   Triglycerides  Date Value Ref Range Status  09/16/2022 83 0 - 149 mg/dL Final         Passed - Patient is not pregnant      Passed - Valid encounter within last 12 months    Recent Outpatient Visits           3 months ago Uncontrolled hypertension   [facility], MD   9 months ago Essential hypertension   [facility] [facility], MD   9 months ago Annual physical exam   [facility] [facility], MD   1 year ago Uncontrolled hypertension   [place] [facility], MD   1 year ago Uncontrolled hypertension   [place] [facility], MD

## 2023-06-28 NOTE — Telephone Encounter (Signed)
 Atorvastatin  prescribed 06/28/2023.

## 2023-06-28 NOTE — Patient Instructions (Addendum)
 Medication Instructions:  INCREASE YOUR HYDRALAZINE  TO 50 MG THREE TIMES A DAY   TAKE THE ATORVASTATIN  DAILY   Labwork: NONE  Testing/Procedures: NONE  Follow-Up: 4 MONTHS WITH EITHER DR [place], [name] W NP, OR [facility] A PHARM D   Any Other Special Instructions Will Be Listed Below (If Applicable). HAVE PLACED REFERRAL FOR SOCIAL WORKER TO REACH OUT ABOUT MENTAL HEALTH RESOURCES   If you need a refill on your cardiac medications before your next appointment, please call your pharmacy.

## 2023-06-28 NOTE — Progress Notes (Signed)
 Advanced Hypertension Clinic Follow-up:  Date:  06/28/2023   ID:  CAMILLIA MARCY, DOB Aug 29, 1970, MRN [id number]  PCP:  Abraham Abo, MD  Cardiologist:   Maudine Sos, MD   No chief complaint on file.   History of Present Illness:  REGAN MCBRYAR is a 53 y.o. female with hypertension, OSA on CPAP, SLE, rheumatoid arthritis, polyclonal gammopathy, Sjogren's disease, and tobacco abuse here for follow-up.  She first found out she had hypertension in 2015 when she was diagnosed with SLE.  Her BP had never been well-controlled.  She previously saw Dr. Nishan 12/2017 for hypertension.  Her blood pressure at that time was around 150 systolic.  She was referred for renal artery Dopplers that were normal.  Labetalol  was increased to 300 mg twice daily.  She was seen in the ED 12/2018 with headache and sinus congestion.  Her blood pressure at that time was 192/131.  She worked at [employer] and walked a lot at work.  She had random episodes of chest pain and diaphoresis.  The pain was on her R side and under her R breast.  It started sharp and then subsided, sometimes associated with heart racing.  Occurred mostly at rest and regularly with exertion.  She did not complete formal exercise outside of work.  She started noticing shortness of breath when walking.  She continued to smoke 1 pack of cigarettes daily.  In the past she tried quitting with Chantix , gum, and she thinks she used Wellbutrin  but was willing to try again.  There was concern for pheochromocytoma.  Plasma catecholamines and metanephrines were negative.  Ms. Pacholski had been under a lot of stress because of her son.  He was still living in [place] and often into a lot of trouble with the law.  She was having some chest pain and was referred for coronary CT-A 05/2019 that revealed normal coronary arteries.  She had shortness of breath and had an echo that revealed LVEF 60 to 65% with grade 2 diastolic dysfunction.  Right atrial pressure was  3 mmHg.  Testing we negative for hyperaldosteronism.  She was referred to endocrinology and they did not feel her labs were consistent with Cushing syndrome.  She was referred for sleep study and was found to have sleep apnea.  CPAP was recommended.  She underwent multiple medication changes and had been working with our pharmacist.  Valsartan  was switched to irbesartan .  She was referred to PREP for exercise but unable to participate due to her work schedule.  She saw our pharmacist on 09/2019.  At that time her blood pressure was 170/112.  Spironolactone  was increased to 50 mg.  However she did not understand this change and had only been taking 25 mg.  Renal function and potassium remained stable with this change.  She noted facial droop and was referred for CT of the head that was normal.  She was trying to cut back on smoking and reduce stress.  Her arthritis medication was changed and her symptoms haven't been well-controlled.  She noted that her BP goes up when she is stressed.  Her lifting at work aggravated her joints and they get hot and swollen at the end of the day.  She had a poor appetite and wasn't eating much.  She didn't have much salt in her diet.  She noted pain in her legs walking at work.   She has struggled to tolerate her CPAP and is interested in the [place] device. She  had an Echo 04/2021 with LVEF 60-65%, mild LVH, and grade 1 diastolic dysfunction. She requested to see pulmonary due to her shortness of breath. She continues to smoke about 1 PPD of cigarettes daily. CT showed lung nodules and emphysema, they recommended increasing her Advair to twice a day. She [facility] 10/2021 and blood pressure remained uncontrolled. She was taking carvedilol  once a day and 100 mg spironolactone  rather than 200 mg. [name] recommended increasing her medications to the prescribed doses.  Ms. Cline was started on clonidine  and she was referred for renal denervation with Dr. Alvenia Aus.  However her  insurance would not cover the procedure.  At her visit 03/2022 she was off her RA meds due to not having a rheumatologist.  She was continuing to smoke and struggled with symptoms of COPD at her visit 02/2023.  SHe was also under stress due to her husband's EtOH use.  She was also drinking 5 cups of coffee daily.  We recommended counselling and she declined.  Tamsulosin  was witched to doxazosin .    Discussed the use of AI scribe software for clinical note transcription with the patient, who gave verbal consent to proceed.  History of Present Illness Ms. Glick is currently on hydrochlorothiazide  and clonidine  for blood pressure management.  Although she is prescribed amlodipine , carvedilol , chlorthalidone , clonidine , doxazosin , irbesartan , and spironolactone , she is not actually taking it.  Recently, she was prescribed hydralazine  25 mg three times a day by her kidney doctor, which she has been taking for two months.  She admits that her nephrologist thought that he was adding hydralazine  on to  her listed medications.  However she is now only taking hydralazine  and her clonidine  patch. Her blood pressure has decreased since starting this regimen, with readings around 140 mmHg.  She is concerned about the number of medications and their side effects, including depression and fatigue.   She has a history of Sjogren's syndrome and lupus, which she believes affects her medication absorption, describing her body as 'dumping' medications. She reports being on various medications in the past, including Remicade, with similar issues.  She experiences significant stressors, including financial difficulties, family issues, and her husband's drinking problem. She has three children in jail and is trying to gain custody of her granddaughter, who is in foster care. She also mentions her son at home and another son in jail both having seizures. She feels overwhelmed by these responsibilities and has been trying to  distance herself from family problems to manage her stress and blood pressure.  She smokes a pack of cigarettes a day and has not been successful with smoking cessation aids like Chantix . She attributes her continued smoking to her depressed mood and high stress levels.  She mentions a recent CT scan showing a decompressed esophagus, which causes discomfort and affects her eating. She experiences pressure in her chest when eating and finds relief by lying on her right side.  Previous antihypertensives: Lisinopril  HCTZ Verapamil Amlodipine  - headache labetalolol- didn't work Atenolol worked some in [place]. Hydralazine  - headaches  Past Medical History:  Diagnosis Date   Atypical chest pain 05/23/2019   Depression 06/28/2023   Essential hypertension 05/23/2019   Hypertension    Lupus    OSA (obstructive sleep apnea) 11/19/2019   Resistant hypertension 05/23/2019   Shortness of breath 05/23/2019   Snoring 05/23/2019   Thyroid  disease    Varicose veins of left lower extremity 01/26/2020    Past Surgical History:  Procedure Laterality Date  ABDOMINAL HYSTERECTOMY       Current Outpatient Medications  Medication Sig Dispense Refill   albuterol  (VENTOLIN  HFA) 108 (90 Base) MCG/ACT inhaler INHALE 2 PUFFS BY MOUTH INTO THE LUNGS EVERY 6 HOURS AS NEEDED FOR WHEEZING OR SHORTNESS OF BREATH 8.5 g 2   aspirin  (EC-81 ASPIRIN ) 81 MG EC tablet Take 1 tablet (81 mg total) by mouth daily. Swallow whole. 30 tablet 12   atorvastatin  (LIPITOR) 40 MG tablet Take 1 tablet (40 mg total) by mouth daily. 90 tablet 1   benzonatate  (TESSALON ) 200 MG capsule Take 1 capsule (200 mg total) by mouth 3 (three) times daily as needed. 45 capsule 1   BREZTRI  AEROSPHERE 160-9-4.8 MCG/ACT AERO inhaler Inhale 2 puffs by mouth into the lungs in the morning and at bedtime. 10.7 g 0   cloNIDine  (CATAPRES  - DOSED IN MG/24 HR) 0.2 mg/24hr patch Place 0.2 mg onto the skin once a week.     fluticasone  (FLONASE ) 50  MCG/ACT nasal spray Place 2 sprays into both nostrils daily. 91 g 1   hydrALAZINE  (APRESOLINE ) 50 MG tablet Take 1 tablet (50 mg total) by mouth 3 (three) times daily. 270 tablet 3   ibuprofen (ADVIL) 200 MG tablet Take 200 mg by mouth every 6 (six) hours as needed.     KLS OMPERAZOLE 20 MG TBEC Take 1 tablet by mouth daily.     mirtazapine  (REMERON ) 7.5 MG tablet Take 1 tablet (7.5 mg total) by mouth at bedtime. 30 tablet 1   pantoprazole  (PROTONIX ) 20 MG tablet Take 1 tablet (20 mg total) by mouth daily. 90 tablet 3   predniSONE  (DELTASONE ) 10 MG tablet 4 tabs for 2 days, then 3 tabs for 2 days, 2 tabs for 2 days, then 1 tab for 2 days, then stop 20 tablet 0   pregabalin  (LYRICA ) 200 MG capsule Take 1 capsule (200 mg total) by mouth 2 (two) times daily. 60 capsule 0   Vitamin D , Ergocalciferol , (DRISDOL ) 1.25 MG (50000 UNIT) CAPS capsule Take 1 capsule (50,000 Units total) by mouth every 7 (seven) days. 12 capsule 0   No current facility-administered medications for this visit.    Allergies:   Labetalol     Social History:  The patient  reports that she has been smoking cigarettes. She started smoking about 32 years ago. She has a 32.4 pack-year smoking history. She has never used smokeless tobacco. She reports that she does not drink alcohol and does not use drugs.   Family History:  The patient's family history includes Breast cancer (age of onset: 36 - 76) in her maternal aunt; CAD in her maternal grandmother; Heart failure in her maternal grandmother; Hypertension in her maternal grandmother and mother; Lupus in her maternal grandmother; Stroke in her maternal grandmother.    ROS:   Please see the history of present illness.    (+) Myalgias (+) Urinary frequency All other systems are reviewed and negative.    PHYSICAL EXAM: VS:  BP (!) 162/118   Pulse 99   Ht 5\' 4"  (1.626 m)   Wt 166 lb (75.3 kg)   SpO2 99%   BMI 28.49 kg/m  , BMI Body mass index is 28.49 kg/m. GENERAL:   Well appearing HEENT: Pupils equal round and reactive, fundi not visualized, oral mucosa unremarkable.  Poor dentition. NECK:  No jugular venous distention, waveform within normal limits, carotid upstroke brisk and symmetric, no bruits LUNGS:  Clear to auscultation bilaterally HEART:  RRR.  PMI not displaced or  sustained,S1 and S2 within normal limits, no S3, no S4, no clicks, no rubs, no murmurs ABD:  Flat, positive bowel sounds normal in frequency in pitch, no bruits, no rebound, no guarding, no midline pulsatile mass, no hepatomegaly, no splenomegaly EXT:  1 plus pulses throughout, no edema, no cyanosis no clubbing SKIN:  No rashes no nodules NEURO:  Cranial nerves II through XII grossly intact, motor grossly intact throughout PSYCH:  Cognitively intact, oriented to person place and time  EKG:  EKG is personally reviewed. 04/14/2022:  EKG was not ordered. 03/12/2022:  Sinus rhythm. Rate 85 bpm. LAFB. LVH. 05/23/2019: sinus rhythm.  Rate 82 bpm.  Nonspecific ST changes.  LAFB.  Cannot rule out anteroseptal infarct    CT Chest  05/22/2021: IMPRESSION: Lung-RADS 2, benign appearance or behavior. Continue annual screening with low-dose chest CT without contrast in 12 months.   Aortic Atherosclerosis (ICD10-I70.0) and Emphysema (ICD10-J43.9).  Echo  05/15/2021:  1. Left ventricular ejection fraction, by estimation, is 60 to 65%. Left  ventricular ejection fraction by 3D volume is 59 %. The left ventricle has  normal function. The left ventricle has no regional wall motion  abnormalities. There is mild concentric  left ventricular hypertrophy. Left ventricular diastolic parameters are  consistent with Grade I diastolic dysfunction (impaired relaxation).   2. Right ventricular systolic function is normal. The right ventricular  size is normal. There is normal pulmonary artery systolic pressure. The  estimated right ventricular systolic pressure is 22.9 mmHg.   3. Left atrial size was  moderately dilated.   4. The mitral valve is grossly normal. No evidence of mitral valve  regurgitation. The mean mitral valve gradient is 1.0 mmHg.   5. The aortic valve is tricuspid. Aortic valve regurgitation is mild. No  aortic stenosis is present.   6. The inferior vena cava is normal in size with greater than 50%  respiratory variability, suggesting right atrial pressure of 3 mmHg.   Comparison(s): No significant change from prior study. EF 60%.   Echo 05/2019: 1. Left ventricular ejection fraction, by estimation, is 60 to 65%. The  left ventricle has normal function. The left ventricle has no regional  wall motion abnormalities. There is mild concentric left ventricular  hypertrophy. Left ventricular diastolic  parameters are consistent with Grade II diastolic dysfunction  (pseudonormalization). Elevated left ventricular end-diastolic pressure.   2. Right ventricular systolic function is normal. The right ventricular  size is normal. There is normal pulmonary artery systolic pressure.   3. Left atrial size was mildly dilated.   4. The mitral valve is normal in structure. Trivial mitral valve  regurgitation. No evidence of mitral stenosis.   5. The aortic valve is tricuspid. Aortic valve regurgitation is not  visualized. No aortic stenosis is present.   6. Aortic dilatation noted. There is mild dilatation of the ascending  aorta measuring 38 mm.   7. The inferior vena cava is normal in size with greater than 50%  respiratory variability, suggesting right atrial pressure of 3 mmHg.   Recent Labs: 09/16/2022: ALT 7; BUN 9; Creatinine, Ser 0.84; Hemoglobin 12.9; Platelets 166; Potassium 4.3; Sodium 142; TSH 1.600    Lipid Panel    Component Value Date/Time   CHOL 165 09/16/2022 1112   TRIG 83 09/16/2022 1112   HDL 56 09/16/2022 1112   CHOLHDL 2.9 09/16/2022 1112   LDLCALC 93 09/16/2022 1112    Wt Readings from Last 3 Encounters:  06/28/23 166 lb (75.3 kg)  05/17/23 161  lb (73 kg)  03/21/23 167 lb 11.2 oz (76.1 kg)     ASSESSMENT AND PLAN:  Assessment & Plan # Hypertension Ms. Hatchell has not been taking her medication as prescribed which significantly affects her BP control. It also leads to misunderstanding and ordering additional medication when she isn't taking what is already prescribed.  Hypertension managed with hydralazine  and clonidine  patch and she believes in this regimen. Home readings 140-160 mmHg. - Increase hydralazine  to 50 mg TID. - Continue clonidine  patch. - Coordinate with nephrologist and other providers for streamlined management. - Monitor blood pressure over the next 24 hours post-dose adjustment. - Proceed with renal denervation when available.  # Hyperlipidemia Not on atorvastatin . Smoking, hypertension, and hyperlipidemia increase cardiovascular risk. - Reinstate atorvastatin  for cholesterol management. - Repeat lipids/CMP in 2-3 months  # Depression Depression exacerbated by financial difficulties, family issues, and lack of spousal support, affecting overall health. - Refer to counseling.  Discussed benefits and encouraged mental health support services.  # Tobacco use disorder Smokes one pack per day. Previous cessation attempts with varenicline  unsuccessful. Uninterested in cessation programs.     Screening for Secondary Hypertension:      Relevant Labs/Studies:    Latest Ref Rng & Units 09/16/2022   11:12 AM 02/16/2022   11:42 AM 03/04/2021    9:39 AM  Basic Labs  Sodium 134 - 144 mmol/L 142  144  141   Potassium 3.5 - 5.2 mmol/L 4.3  3.7  4.7   Creatinine 0.57 - 1.00 mg/dL 9.52  8.41  3.24        Latest Ref Rng & Units 09/16/2022   11:12 AM 06/05/2020    7:56 AM  Thyroid    TSH 0.450 - 4.500 uIU/mL 1.600  1.82          Latest Ref Rng & Units 07/23/2019    2:04 PM 05/23/2019    9:25 AM  Metanephrines/Catecholamines   Epinephrine pg/mL <20    Norepinephrine pg/mL 319    Dopamine pg/mL <10     Metanephrines <=57 pg/mL 56  51.8   Normetanephrines  <=148 pg/mL 84  103.3           01/23/2018   12:26 PM  Renovascular   Renal Artery US  Completed Yes   Medication Adjustments/Labs and Tests Ordered: Current medicines are reviewed at length with the patient today.  Concerns regarding medicines are outlined above.   Orders Placed This Encounter  Procedures   Referral to HRT/VAS Care Navigation   Meds ordered this encounter  Medications   hydrALAZINE  (APRESOLINE ) 50 MG tablet    Sig: Take 1 tablet (50 mg total) by mouth 3 (three) times daily.    Dispense:  270 tablet    Refill:  3    NEW DOSE, D/C PREVIOUS RX   Patient Instructions  Medication Instructions:  INCREASE YOUR HYDRALAZINE  TO 50 MG THREE TIMES A DAY   TAKE THE ATORVASTATIN  DAILY   Labwork: NONE  Testing/Procedures: NONE  Follow-Up: 4 MONTHS WITH EITHER DR Jerris Fleer, [name] W NP, OR KRISTIN A PHARM D   Any Other Special Instructions Will Be Listed Below (If Applicable). HAVE PLACED REFERRAL FOR SOCIAL WORKER TO REACH OUT ABOUT MENTAL HEALTH RESOURCES   If you need a refill on your cardiac medications before your next appointment, please call your pharmacy.   Signed, Korianna Washer C. Theodis Fiscal, MD, [facility]  06/28/2023 10:55 AM    [facility]

## 2023-06-28 NOTE — Telephone Encounter (Signed)
 H&V Care Navigation CSW Progress Note  Clinical Social Worker contacted patient by phone to f/u on new referral for mental health resources. Our team has worked with pt in the past for financial challenges with rent and utilities as well as other financial difficulties and family challenges. Note that pt had been given resources in the past but unsure if ever connected with provider for supportive counseling. Attempted to reach pt today at [phone]. No answer, left voicemail. Will re-attempt again if no call back.  Patient is participating in a Managed Medicaid Plan:  No, commercial Aetna only  SDOH Screenings   Food Insecurity: Food Insecurity Present (03/03/2023)  Housing: High Risk (03/03/2023)  Transportation Needs: No Transportation Needs (03/03/2023)  Utilities: At Risk (03/03/2023)  Alcohol Screen: Low Risk  (03/03/2023)  Depression (PHQ2-9): High Risk (03/03/2023)  Financial Resource Strain: High Risk (03/03/2023)  Physical Activity: Unknown (03/03/2023)  Social Connections: Moderately Isolated (03/03/2023)  Stress: Stress Concern Present (03/03/2023)  Tobacco Use: High Risk (06/28/2023)  Health Literacy: Adequate Health Literacy (03/03/2023)    Nathen Balder, MSW, LCSW Clinical Social Worker II [facility] Heart/Vascular Care Navigation  [phone]- work cell phone (preferred)

## 2023-06-29 ENCOUNTER — Telehealth: Payer: Self-pay | Admitting: Licensed Clinical Social Worker

## 2023-06-29 NOTE — Progress Notes (Signed)
 Heart and Vascular Care Navigation  06/29/2023  IRIDIAN READER 1971-01-11 [id number]  Reason for Referral: mental health resources; community resources for financial support Patient is participating in a Managed Medicaid Plan: No, Aetna commercial plan only  Engaged with patient by telephone for initial visit for Heart and Vascular Care Coordination.                                                                                                   Assessment:  LCSW was able to reach pt today at [phone]. Re-introduced self, role, reason for call. Confirmed home address on file at [address] is correct. Pt still residing with husband Royston Cornea and one of her 7 children (a son). Shares at this time she has multiple sons who ar currently incarcerated, and one of her daughters called after her doctors appt with [facility] and shared she is pregnant which pt is concerned about. Pt is feeling overwhelmed by continuing to have to manage family concerns as well as her own financial challenges. Pt shares she does have access to a car, has been trying her best to make payments on time- they were forced to get a second car b/c she and her husband work different shifts. Still having challenges with paying rent and utilities, makes too much money with her job for assistance such as Medicaid and SNAP. Spouse does not contribute her report to expenses. Pt at this time is interested in new community support resource packets, as well as formal counseling to help her manage her stressors.   We discussed referral to Strong Minds program at [facility] which is a free 10-session psychoeducation skills program for adults 18+. Pt agreeable, while working with this program pt can use our mental health resource packet to identify supports that may work around her schedule. Discussed that she also should use her insurance plan to identify if anyone is in network with her coverage/connect with sliding scale providers. Will also  send resources for rent/utilities and food.   No additional questions at this time. Allowed pt space to discuss how her families needs make her feel. Encouraged her to call us  as any other needs arise.                                    HRT/VAS Care Coordination     Patients Home Cardiology Office --  [facility]   Outpatient Care Team Social Worker   Social Worker Name: Nathen Balder, [place], [phone]   Living arrangements for the past 2 months Apartment   Lives with: Spouse; Adult Children   Patient Current Optometrist   Patient Has Concern With Paying Medical Bills No   Does Patient Have Prescription Coverage? Yes       Social History:  SDOH Screenings   Food Insecurity: Food Insecurity Present (06/29/2023)  Housing: High Risk (06/29/2023)  Transportation Needs: No Transportation Needs (06/29/2023)  Utilities: At Risk (06/29/2023)  Alcohol Screen: Low Risk  (03/03/2023)  Depression (PHQ2-9): High Risk (03/03/2023)  Financial Resource Strain: High Risk (06/29/2023)  Physical Activity: Unknown (03/03/2023)  Social Connections: Moderately Isolated (03/03/2023)  Stress: Stress Concern Present (06/29/2023)  Tobacco Use: High Risk (06/28/2023)  Health Literacy: Adequate Health Literacy (06/29/2023)    SDOH Interventions: Financial Resources:  Surveyor, quantity Strain Interventions: [employer] Provided DSS for financial assistance and rent/utility assistance and food assistance resources provided  Food Insecurity:  Food Insecurity Interventions: [employer] Provided [employer]; [place] food [place]- unfortunately not eligible for [employer] per her report)  Housing Insecurity:  Housing Interventions: [employer] Provided (rent/utility assistance list provided- pt has already [employer] out [employer] assistance and does not meet criteria for assistance again at this  time)  Transportation:   Transportation Interventions: Intervention Not Indicated (pt has car, challenges with car payments but states she is doing okay)    Other Care Navigation Interventions:     Provided Pharmacy assistance resources  Pt currently has insurance and benefits through her employer Health visitor)  Patient expressed Mental Health concerns Yes, Referred to:  [employer]; provided information about behavioral health resources (will f/u on how to utilize this list)   Follow-up plan:   LCSW has made referral to [employer] program, discussed that this is not formal counseling but a good way to learn stress management techniques, and receive support as she identifies formal counseling support from resources provided. Also mailed my card, local rent/utility assistance options, Strong Minds brocheure, mental health resources and food pantry resources. Will ensure pt has received these but encouraged she call me once received or if any additional questions arise before that time.

## 2023-07-08 ENCOUNTER — Other Ambulatory Visit: Payer: Self-pay | Admitting: Pulmonary Disease

## 2023-07-14 ENCOUNTER — Telehealth: Payer: Self-pay | Admitting: Licensed Clinical Social Worker

## 2023-07-14 NOTE — Telephone Encounter (Addendum)
 H&V Care Navigation CSW Progress Note  Clinical Social Worker contacted patient by phone to on referral to [employer] program with [facility] and other community resources. Pt reached at [phone]. She confirms she received resources sent to her with mental health resources and other community supports. She also was able to schedule an assessment with [employer] program through [facility] but then fell asleep and didn't wake up for assessment. Provided her with the number for the person trying to reach her, requested she give them a call back and see if program does interest her and if eligible. Remain available, will try and f/u to see if any additional updates or if pt has used any resources provided.   Patient is participating in a Managed Medicaid Plan:  No  SDOH Screenings   Food Insecurity: Food Insecurity Present (06/29/2023)  Housing: High Risk (06/29/2023)  Transportation Needs: No Transportation Needs (06/29/2023)  Utilities: At Risk (06/29/2023)  Alcohol Screen: Low Risk  (03/03/2023)  Depression (PHQ2-9): High Risk (03/03/2023)  Financial Resource Strain: High Risk (06/29/2023)  Physical Activity: Unknown (03/03/2023)  Social Connections: Moderately Isolated (03/03/2023)  Stress: Stress Concern Present (06/29/2023)  Tobacco Use: High Risk (06/28/2023)  Health Literacy: Adequate Health Literacy (06/29/2023)    Nathen Balder, MSW, LCSW Clinical Social Worker II [facility] Health Heart/Vascular Care Navigation  [phone]- work cell phone (preferred)

## 2023-07-22 ENCOUNTER — Telehealth: Payer: Self-pay | Admitting: Licensed Clinical Social Worker

## 2023-07-22 NOTE — Telephone Encounter (Signed)
 H&V Care Navigation CSW Progress Note  Clinical Social Worker contacted patient by phone to f/u on resources and to see if pt has been able to get back in touch with [employer] program as she was asleep during first scheduled assessment. No answer today and voicemail full at [phone]. Will attempt again as able.  Patient is participating in a Managed Medicaid Plan:  No, Engineer, building services only  SDOH Screenings   Food Insecurity: Food Insecurity Present (06/29/2023)  Housing: High Risk (06/29/2023)  Transportation Needs: No Transportation Needs (06/29/2023)  Utilities: At Risk (06/29/2023)  Alcohol Screen: Low Risk  (03/03/2023)  Depression (PHQ2-9): High Risk (03/03/2023)  Financial Resource Strain: High Risk (06/29/2023)  Physical Activity: Unknown (03/03/2023)  Social Connections: Moderately Isolated (03/03/2023)  Stress: Stress Concern Present (06/29/2023)  Tobacco Use: High Risk (06/28/2023)  Health Literacy: Adequate Health Literacy (06/29/2023)    Marit Lark, MSW, LCSW Clinical Social Worker II [facility] Health Heart/Vascular Care Navigation  [phone]- work cell phone (preferred)

## 2023-07-27 ENCOUNTER — Telehealth: Payer: Self-pay | Admitting: Licensed Clinical Social Worker

## 2023-07-27 ENCOUNTER — Emergency Department (HOSPITAL_COMMUNITY)
Admission: EM | Admit: 2023-07-27 | Discharge: 2023-07-27 | Disposition: A | Discharge status: Home / Self Care | Attending: Emergency Medicine | Admitting: Emergency Medicine

## 2023-07-27 ENCOUNTER — Emergency Department (HOSPITAL_COMMUNITY)

## 2023-07-27 ENCOUNTER — Other Ambulatory Visit: Payer: Self-pay

## 2023-07-27 ENCOUNTER — Encounter (HOSPITAL_COMMUNITY): Payer: Self-pay

## 2023-07-27 DIAGNOSIS — I10 Essential (primary) hypertension: Secondary | ICD-10-CM | POA: Diagnosis present

## 2023-07-27 DIAGNOSIS — Z7982 Long term (current) use of aspirin: Secondary | ICD-10-CM | POA: Insufficient documentation

## 2023-07-27 DIAGNOSIS — Z79899 Other long term (current) drug therapy: Secondary | ICD-10-CM | POA: Insufficient documentation

## 2023-07-27 LAB — CBC WITH DIFFERENTIAL/PLATELET
Abs Immature Granulocytes: 0.03 10*3/uL (ref 0.00–0.07)
Basophils Absolute: 0 10*3/uL (ref 0.0–0.1)
Basophils Relative: 0 %
Eosinophils Absolute: 0.1 10*3/uL (ref 0.0–0.5)
Eosinophils Relative: 1 %
HCT: 45.2 % (ref 36.0–46.0)
Hemoglobin: 14.4 g/dL (ref 12.0–15.0)
Immature Granulocytes: 0 %
Lymphocytes Relative: 22 %
Lymphs Abs: 2 10*3/uL (ref 0.7–4.0)
MCH: 27.7 pg (ref 26.0–34.0)
MCHC: 31.9 g/dL (ref 30.0–36.0)
MCV: 87.1 fL (ref 80.0–100.0)
Monocytes Absolute: 0.5 10*3/uL (ref 0.1–1.0)
Monocytes Relative: 6 %
Neutro Abs: 6.4 10*3/uL (ref 1.7–7.7)
Neutrophils Relative %: 71 %
Platelets: 208 10*3/uL (ref 150–400)
RBC: 5.19 MIL/uL — ABNORMAL HIGH (ref 3.87–5.11)
RDW: 13.9 % (ref 11.5–15.5)
WBC: 9.1 10*3/uL (ref 4.0–10.5)
nRBC: 0 % (ref 0.0–0.2)

## 2023-07-27 LAB — BASIC METABOLIC PANEL WITH GFR
Anion gap: 12 (ref 5–15)
BUN: 6 mg/dL (ref 6–20)
CO2: 27 mmol/L (ref 22–32)
Calcium: 9.5 mg/dL (ref 8.9–10.3)
Chloride: 102 mmol/L (ref 98–111)
Creatinine, Ser: 0.94 mg/dL (ref 0.44–1.00)
GFR, Estimated: >60 mL/min (ref >60)
Glucose, Bld: 126 mg/dL — ABNORMAL HIGH (ref 70–99)
Potassium: 3.4 mmol/L — ABNORMAL LOW (ref 3.5–5.1)
Sodium: 141 mmol/L (ref 135–145)

## 2023-07-27 LAB — TROPONIN I (HIGH SENSITIVITY): Troponin I (High Sensitivity): 6 ng/L (ref <18)

## 2023-07-27 MED ORDER — HYDRALAZINE HCL 25 MG PO TABS
50.0000 mg | ORAL_TABLET | Freq: Once | ORAL | Status: AC
  Administered 2023-07-27: 50 mg via ORAL
  Filled 2023-07-27: qty 2

## 2023-07-27 NOTE — Discharge Instructions (Signed)
 Thank you for letting us  evaluate you today.  We have given you a dose of your high blood pressure medication here in the emergency department.  Your lab work is unremarkable and per your baseline.  Please continue to take your blood pressure medications as prescribed and follow-up with cardiology, PCP for further management of your blood pressure  Return to emergency department if you experience chest pain, shortness of breath, numbness or weakness in one-sided body, slurred speech, altered mentation, severe headache at maximum intensity on onset, seizures

## 2023-07-27 NOTE — ED Provider Notes (Signed)
 [facility] Provider Note   CSN: [id number] Arrival date & time: 07/27/23  1523     Patient presents with: Hypertension   Denise Carter is a 53 y.o. female with pmhx of hypertension, OSA on CPAP, SLE, rheumatoid arthritis, polyclonal gammopathy, Sjogren's disease, and tobacco abuse presents to Emergency Department from gastroenterology office for evaluation of HTN.  She was attempting to get a colonoscopy when they took her blood pressure and it read 242 systolic.  She reports that she did not take her hydralazine  or clonidine  patch today as she was focused on getting to her colonoscopy appointment.  She reports that she normally is 140-160 SBP at home.  She has absolutely no complaints.  Denies chest pain, shortness of breath, headache, blurry vision     Hypertension Pertinent negatives include no chest pain and no shortness of breath.       Prior to Admission medications   Medication Sig Start Date End Date Taking? Authorizing Provider  albuterol  (VENTOLIN  HFA) 108 (90 Base) MCG/ACT inhaler INHALE 2 PUFFS BY MOUTH INTO THE LUNGS EVERY 6 HOURS AS NEEDED FOR WHEEZING OR SHORTNESS OF BREATH 02/16/23   Tanda Bleacher, MD  aspirin  [phone] ASPIRIN ) 81 MG EC tablet Take 1 tablet (81 mg total) by mouth daily. Swallow whole. 12/06/19   Prentiss Dorothyann Maxwell, MD  atorvastatin  (LIPITOR) 40 MG tablet Take 1 tablet (40 mg total) by mouth daily. 06/28/23   Lorren Greig PARAS, NP  benzonatate  (TESSALON ) 200 MG capsule Take 1 capsule (200 mg total) by mouth 3 (three) times daily as needed. 12/27/22 12/27/23  Parrett, Madelin RAMAN, NP  budesonide-glycopyrrolate-formoterol (BREZTRI  AEROSPHERE) 160-9-4.8 MCG/ACT AERO inhaler Inhale 2 puffs by mouth into the lungs in the morning and at bedtime. 07/08/23   Neda Jennet LABOR, MD  cloNIDine  (CATAPRES  - DOSED IN MG/24 HR) 0.2 mg/24hr patch Place 0.2 mg onto the skin once a week.    [provider]  fluticasone   (FLONASE ) 50 MCG/ACT nasal spray Place 2 sprays into both nostrils daily. 02/13/21   Tanda Bleacher, MD  hydrALAZINE  (APRESOLINE ) 50 MG tablet Take 1 tablet (50 mg total) by mouth 3 (three) times daily. 06/28/23 09/26/23  Raford Riggs, MD  ibuprofen (ADVIL) 200 MG tablet Take 200 mg by mouth every 6 (six) hours as needed.    [provider]  KLS OMPERAZOLE 20 MG TBEC Take 1 tablet by mouth daily. 12/02/21   [provider]  mirtazapine  (REMERON ) 7.5 MG tablet Take 1 tablet (7.5 mg total) by mouth at bedtime. 03/03/23   Tanda Bleacher, MD  pantoprazole  (PROTONIX ) 20 MG tablet Take 1 tablet (20 mg total) by mouth daily. 11/17/21   Walker, [name] S, NP  predniSONE  (DELTASONE ) 10 MG tablet 4 tabs for 2 days, then 3 tabs for 2 days, 2 tabs for 2 days, then 1 tab for 2 days, then stop 12/27/22   Parrett, Tammy S, NP  pregabalin  (LYRICA ) 200 MG capsule Take 1 capsule (200 mg total) by mouth 2 (two) times daily. 06/01/23   Tanda Bleacher, MD  Vitamin D , Ergocalciferol , (DRISDOL ) 1.25 MG (50000 UNIT) CAPS capsule Take 1 capsule (50,000 Units total) by mouth every 7 (seven) days. 09/20/22   Tanda Bleacher, MD    Allergies: Labetalol     Review of Systems  Respiratory:  Negative for shortness of breath.   Cardiovascular:  Negative for chest pain and leg swelling.  Neurological:  Negative for dizziness.    Updated Vital Signs  BP (!) 192/129 (BP Location: Right Arm)   Pulse 91   Temp 98.4 F (36.9 C) (Oral)   Resp 18   Ht 5' 4 (1.626 m)   Wt 75.3 kg   SpO2 99%   BMI 28.49 kg/m   Physical Exam Vitals and nursing note reviewed.  Constitutional:      General: She is not in acute distress.    Appearance: Normal appearance. She is not ill-appearing.  HENT:     Head: Normocephalic and atraumatic.  Eyes:     General: Lids are normal. Vision grossly intact. No visual field deficit.    Extraocular Movements:     Right eye: Normal extraocular motion and no nystagmus.     Left eye:  Normal extraocular motion and no nystagmus.     Conjunctiva/sclera: Conjunctivae normal.  Cardiovascular:     Rate and Rhythm: Normal rate.  Pulmonary:     Effort: Pulmonary effort is normal. No respiratory distress.     Breath sounds: Normal breath sounds.  Chest:     Chest wall: No tenderness.  Musculoskeletal:     Cervical back: Normal range of motion and neck supple. No rigidity.     Right lower leg: No edema.     Left lower leg: No edema.  Skin:    Coloration: Skin is not jaundiced or pale.  Neurological:     Mental Status: She is alert and oriented to person, place, and time. Mental status is at baseline.     GCS: GCS eye subscore is 4. GCS verbal subscore is 5. GCS motor subscore is 6.     Cranial Nerves: No cranial nerve deficit, dysarthria or facial asymmetry.     Sensory: Sensation is intact. No sensory deficit.     Motor: No weakness, tremor, atrophy, abnormal muscle tone, seizure activity or pronator drift.     Coordination: Coordination normal. Finger-Nose-Finger Test and Heel to [place] Test normal.     Gait: Gait is intact.     (all labs ordered are listed, but only abnormal results are displayed) Labs Reviewed  CBC WITH DIFFERENTIAL/PLATELET - Abnormal; Notable for the following components:      Result Value   RBC 5.19 (*)    All other components within normal limits  BASIC METABOLIC PANEL WITH GFR - Abnormal; Notable for the following components:   Potassium 3.4 (*)    Glucose, Bld 126 (*)    All other components within normal limits  TROPONIN I (HIGH SENSITIVITY)  TROPONIN I (HIGH SENSITIVITY)    EKG: None  Radiology: DG Chest 2 View Result Date: 07/27/2023 CLINICAL DATA:  tachycardia, hypertension EXAM: CHEST - 2 VIEW COMPARISON:  May 21, 2023 FINDINGS: The small lung nodules on the prior chest CT are not well visualized by plain radiography. No focal airspace consolidation, pleural effusion, or pneumothorax. No cardiomegaly. Tortuous thoracic aorta. No  acute fracture or destructive lesion. Multilevel thoracic osteophytosis. IMPRESSION: No acute cardiopulmonary abnormality. Electronically Signed   By: Rogelia Myers M.D.   On: 07/27/2023 16:35     Medications Ordered in the ED  hydrALAZINE  (APRESOLINE ) tablet 50 mg (has no administration in time range)                                    Medical Decision Making  Patient presents to the ED for concern of hypertension, this involves an extensive number of treatment options, and is  a complaint that carries with it a high risk of complications and morbidity.  The differential diagnosis includes hypertensive emergency, CVA/TIA, ACS Dr. Ismael   Co morbidities that complicate the patient evaluation  See HPI   Additional history obtained:  Additional history obtained from Nursing and Outside Medical Records   External records from outside source obtained and reviewed including triage RN note, cardiology note from 07/25/2023   Lab Tests:  I Ordered, and personally interpreted labs.  The pertinent results include:   Potassium 3.4 CBG 126 Troponin negative.   Imaging Studies ordered:  I ordered imaging studies including chest x-ray  I independently visualized and interpreted imaging which showed  no acute cardiopulmonary pathology I agree with the radiologist interpretation   Cardiac Monitoring:  The patient was maintained on a cardiac monitor.  I personally viewed and interpreted the cardiac monitored which showed an underlying rhythm of: Sinus tachycardia at 106 bpm with no ST nor T wave abnormalities   Medicines ordered and prescription drug management:  I ordered medication including hydralazine  50 mg for hypertension Reevaluation of the patient after these medicines showed that the patient improved I have reviewed the patients home medicines and have made adjustments as needed     Problem List / ED Course:  Asymptomatic HTN No signs of EOD on exam.  Troponin  negative.  Creatinine WNL.  Neurologically intact. No complaints nor visual disturbances.  Completely asymptomatic.  lab work unremarkable.  Troponin negative. Provided a dose of her hydralazine  as prescribed in ED She has very good follow-up with cardiology and PCP who have done extensive work on calling out different etiologies of HTN.  Extension of so we will have her follow-up with them regarding her blood pressure medication management   Reevaluation:  After the interventions noted above, I reevaluated the patient and found that they have :improved   Social Determinants of Health:  Has PCP   Dispostion:  After consideration of the diagnostic results and the patients response to treatment, I feel that the patent would benefit from outpatient management with PCP, cards follow-up for HTN management.   Discussed ED workup, disposition, return to ED precautions with patient who expresses understanding agrees with plan.  All questions answered to their satisfaction.  They are agreeable to plan.  Discharge instructions provided on paperwork  Final diagnoses:  Asymptomatic hypertension    ED Discharge Orders     None          Denise Tinnie BRAVO, PA 07/27/23 2032    Armenta Canning, MD 08/05/23 2340

## 2023-07-27 NOTE — ED Provider Triage Note (Signed)
 Emergency Medicine Provider Triage Evaluation Note  Denise Carter , a 53 y.o. female  was evaluated in triage.  Pt complains of high blood pressure while getting a EGD and colonoscopy today.  They did not proceed with this procedure due to her blood pressure.  Patient typically takes her hydralazine  all at once at night.  She has not taken this today.  Patient is completely asymptomatic.  Review of Systems  Positive:  Negative: See above   Physical Exam  BP (!) 209/146   Pulse (!) 114   Temp 98.4 F (36.9 C)   Resp 14   Ht 5' 4 (1.626 m)   Wt 75.3 kg   SpO2 99%   BMI 28.49 kg/m  Gen:   Awake, no distress   Resp:  Normal effort  MSK:   Moves extremities without difficulty  Other:  Tachycardic.  Medical Decision Making  Medically screening exam initiated at 4:17 PM.  Appropriate orders placed.  JALISA SACCO was informed that the remainder of the evaluation will be completed by another provider, this initial triage assessment does not replace that evaluation, and the importance of remaining in the ED until their evaluation is complete.     Theotis Peers [place], [place] 07/27/23 [phone]

## 2023-07-27 NOTE — ED Triage Notes (Signed)
 Pt sent from GI doctor for HTN, pt was going to have a colonoscopy today but they did not do it because of her BP. Pt has no complaints, states her BP is always high. Compliant with medications

## 2023-07-27 NOTE — Telephone Encounter (Signed)
 H&V Care Navigation CSW Progress Note  Clinical Social Worker contacted patient by phone to f/u on referral to [employer] program with [facility]. Note she declined their services, pt has been sent additional therapeutic resources which she confirmed she received. No answer last call and again today, unable to leave voicemail as it is full. Remain available should pt re-engage with care navigation resources.  Patient is participating in a Managed Medicaid Plan: No, Engineer, building services  SDOH Screenings   Food Insecurity: Food Insecurity Present (06/29/2023)  Housing: High Risk (06/29/2023)  Transportation Needs: No Transportation Needs (06/29/2023)  Utilities: At Risk (06/29/2023)  Alcohol Screen: Low Risk  (03/03/2023)  Depression (PHQ2-9): High Risk (03/03/2023)  Financial Resource Strain: High Risk (06/29/2023)  Physical Activity: Unknown (03/03/2023)  Social Connections: Moderately Isolated (03/03/2023)  Stress: Stress Concern Present (06/29/2023)  Tobacco Use: High Risk (07/27/2023)  Health Literacy: Adequate Health Literacy (06/29/2023)    Marit Lark, MSW, LCSW Clinical Social Worker II [facility] Health Heart/Vascular Care Navigation  [phone]- work cell phone (preferred)

## 2023-07-27 NOTE — ED Triage Notes (Signed)
 Pt to er, pt states that she wen to have a colonoscopy today and they would not do it because they said that her blood pressure was too high.  Pt states that she didn't have a headache before, but she has a slight headache now.  Denies chest pain or dizziness.

## 2023-07-30 ENCOUNTER — Emergency Department (HOSPITAL_COMMUNITY)
Admission: EM | Admit: 2023-07-30 | Discharge: 2023-07-30 | Disposition: A | Discharge status: Home / Self Care | Attending: Emergency Medicine | Admitting: Emergency Medicine

## 2023-07-30 ENCOUNTER — Emergency Department (HOSPITAL_COMMUNITY)

## 2023-07-30 ENCOUNTER — Other Ambulatory Visit: Payer: Self-pay

## 2023-07-30 ENCOUNTER — Encounter (HOSPITAL_COMMUNITY): Payer: Self-pay

## 2023-07-30 DIAGNOSIS — R7989 Other specified abnormal findings of blood chemistry: Secondary | ICD-10-CM | POA: Diagnosis not present

## 2023-07-30 DIAGNOSIS — I1A0 Resistant hypertension: Secondary | ICD-10-CM | POA: Diagnosis not present

## 2023-07-30 DIAGNOSIS — R519 Headache, unspecified: Secondary | ICD-10-CM | POA: Diagnosis present

## 2023-07-30 DIAGNOSIS — Z7982 Long term (current) use of aspirin: Secondary | ICD-10-CM | POA: Diagnosis not present

## 2023-07-30 DIAGNOSIS — Z79899 Other long term (current) drug therapy: Secondary | ICD-10-CM | POA: Insufficient documentation

## 2023-07-30 LAB — CBC
HCT: 43.6 % (ref 36.0–46.0)
Hemoglobin: 14.5 g/dL (ref 12.0–15.0)
MCH: 28.8 pg (ref 26.0–34.0)
MCHC: 33.3 g/dL (ref 30.0–36.0)
MCV: 86.5 fL (ref 80.0–100.0)
Platelets: 192 K/uL (ref 150–400)
RBC: 5.04 MIL/uL (ref 3.87–5.11)
RDW: 14 % (ref 11.5–15.5)
WBC: 8.9 K/uL (ref 4.0–10.5)
nRBC: 0 % (ref 0.0–0.2)

## 2023-07-30 LAB — URINALYSIS, ROUTINE W REFLEX MICROSCOPIC
Bacteria, UA: NONE SEEN
Bilirubin Urine: NEGATIVE
Glucose, UA: NEGATIVE mg/dL
Hgb urine dipstick: NEGATIVE
Ketones, ur: NEGATIVE mg/dL
Nitrite: NEGATIVE
Protein, ur: NEGATIVE mg/dL
Specific Gravity, Urine: 1.021 (ref 1.005–1.030)
pH: 6 (ref 5.0–8.0)

## 2023-07-30 LAB — RAPID URINE DRUG SCREEN, HOSP PERFORMED
Amphetamines: NOT DETECTED
Barbiturates: NOT DETECTED
Benzodiazepines: NOT DETECTED
Cocaine: NOT DETECTED
Opiates: NOT DETECTED
Tetrahydrocannabinol: POSITIVE — AB

## 2023-07-30 LAB — BASIC METABOLIC PANEL WITH GFR
Anion gap: 13 (ref 5–15)
BUN: 8 mg/dL (ref 6–20)
CO2: 25 mmol/L (ref 22–32)
Calcium: 9.5 mg/dL (ref 8.9–10.3)
Chloride: 105 mmol/L (ref 98–111)
Creatinine, Ser: 0.9 mg/dL (ref 0.44–1.00)
GFR, Estimated: >60 mL/min (ref >60)
Glucose, Bld: 103 mg/dL — ABNORMAL HIGH (ref 70–99)
Potassium: 3.6 mmol/L (ref 3.5–5.1)
Sodium: 143 mmol/L (ref 135–145)

## 2023-07-30 LAB — TROPONIN I (HIGH SENSITIVITY)
Troponin I (High Sensitivity): 20 ng/L — ABNORMAL HIGH (ref <18)
Troponin I (High Sensitivity): 21 ng/L — ABNORMAL HIGH

## 2023-07-30 MED ORDER — METOCLOPRAMIDE HCL 5 MG/ML IJ SOLN
10.0000 mg | Freq: Once | INTRAMUSCULAR | Status: AC
  Administered 2023-07-30: 10 mg via INTRAVENOUS
  Filled 2023-07-30: qty 2

## 2023-07-30 MED ORDER — ACETAMINOPHEN 500 MG PO TABS
1000.0000 mg | ORAL_TABLET | Freq: Once | ORAL | Status: AC
  Administered 2023-07-30: 1000 mg via ORAL
  Filled 2023-07-30: qty 2

## 2023-07-30 MED ORDER — CLONIDINE HCL 0.2 MG PO TABS
0.2000 mg | ORAL_TABLET | Freq: Once | ORAL | Status: AC
Start: 2023-07-30 — End: 2023-07-30
  Administered 2023-07-30: 0.2 mg via ORAL
  Filled 2023-07-30: qty 1

## 2023-07-30 MED ORDER — HYDRALAZINE HCL 25 MG PO TABS
50.0000 mg | ORAL_TABLET | Freq: Once | ORAL | Status: AC
  Administered 2023-07-30: 50 mg via ORAL
  Filled 2023-07-30: qty 2

## 2023-07-30 MED ORDER — DIPHENHYDRAMINE HCL 50 MG/ML IJ SOLN
12.5000 mg | Freq: Once | INTRAMUSCULAR | Status: AC
  Administered 2023-07-30: 12.5 mg via INTRAVENOUS
  Filled 2023-07-30: qty 1

## 2023-07-30 NOTE — Discharge Instructions (Addendum)
 It was a pleasure to care for you today.  Based on your history, physical exam, labs, and imaging I feel you are safe for discharge.  Today we treated your headache as well as your blood pressure.  As we treated your headache the pain in your left arm and left leg subsided.  Please follow-up with your primary care provider and specialist as scheduled, sooner if symptoms warrant.  Please return to the emergency department or seek further medical care if you experiencing the following symptoms including but not limited to fever, chills, chest pain, shortness of breath, severe abdominal pain, visual disturbances, severe headache, weakness, numbness, tingling, or other concerning symptoms.  Please follow-up with your primary care provider and cardiologist regarding ongoing management of your high blood pressure, please continue to take prescribed high blood pressure medications at home as instructed.

## 2023-07-30 NOTE — ED Provider Notes (Signed)
  EMERGENCY DEPARTMENT AT [facility] Provider Note   CSN: [id number] Arrival date & time: 07/30/23  1346    Patient presents with: Arm Pain and Hypertension   Denise Carter is a 53 y.o. female who presents to the emergency department with a chief complaint of headache, elevated blood pressure, pain in left arm going down to left leg.  Patient was recently seen in the emergency department 3 days ago for hypertension, patient states that she took her hypertension medications this morning but decided to come in due to her elevated blood pressure, headache, as well as the pain in her left arm going down to her left leg.  Patient has past medical history significant for rheumatoid arthritis, Sjogren's disease, resistant hypertension, chronic pain, psoriatic arthritis, lupus, etc.  Denies facial droop, slurred speech, trouble finding words, unilateral weakness, changes in sensation.  Denies visual disturbances, chest pain, shortness of breath, abdominal pain.  Denies trauma/injury.    Arm Pain Associated symptoms include headaches.  Hypertension Associated symptoms include headaches.       Prior to Admission medications   Medication Sig Start Date End Date Taking? Authorizing Provider  albuterol  (VENTOLIN  HFA) 108 (90 Base) MCG/ACT inhaler INHALE 2 PUFFS BY MOUTH INTO THE LUNGS EVERY 6 HOURS AS NEEDED FOR WHEEZING OR SHORTNESS OF BREATH 02/16/23   Tanda Bleacher, MD  aspirin  (EC-81 ASPIRIN ) 81 MG EC tablet Take 1 tablet (81 mg total) by mouth daily. Swallow whole. 12/06/19   Prentiss Dorothyann Maxwell, MD  atorvastatin  (LIPITOR) 40 MG tablet Take 1 tablet (40 mg total) by mouth daily. 06/28/23   Lorren Greig PARAS, NP  benzonatate  (TESSALON ) 200 MG capsule Take 1 capsule (200 mg total) by mouth 3 (three) times daily as needed. 12/27/22 12/27/23  Parrett, Madelin RAMAN, NP  budesonide-glycopyrrolate-formoterol (BREZTRI  AEROSPHERE) 160-9-4.8 MCG/ACT AERO inhaler Inhale 2 puffs by mouth  into the lungs in the morning and at bedtime. 07/08/23   Olalere, Jennet LABOR, MD  cloNIDine  (CATAPRES  - DOSED IN MG/24 HR) 0.2 mg/24hr patch Place 0.2 mg onto the skin once a week.    [provider]  fluticasone  (FLONASE ) 50 MCG/ACT nasal spray Place 2 sprays into both nostrils daily. 02/13/21   Tanda Bleacher, MD  hydrALAZINE  (APRESOLINE ) 50 MG tablet Take 1 tablet (50 mg total) by mouth 3 (three) times daily. 06/28/23 09/26/23  Raford Riggs, MD  ibuprofen (ADVIL) 200 MG tablet Take 200 mg by mouth every 6 (six) hours as needed.    [provider]  KLS OMPERAZOLE 20 MG TBEC Take 1 tablet by mouth daily. 12/02/21   [provider]  mirtazapine  (REMERON ) 7.5 MG tablet Take 1 tablet (7.5 mg total) by mouth at bedtime. 03/03/23   Tanda Bleacher, MD  pantoprazole  (PROTONIX ) 20 MG tablet Take 1 tablet (20 mg total) by mouth daily. 11/17/21   Walker, [name] S, NP  predniSONE  (DELTASONE ) 10 MG tablet 4 tabs for 2 days, then 3 tabs for 2 days, 2 tabs for 2 days, then 1 tab for 2 days, then stop 12/27/22   Parrett, Tammy S, NP  pregabalin  (LYRICA ) 200 MG capsule Take 1 capsule (200 mg total) by mouth 2 (two) times daily. 06/01/23   Tanda Bleacher, MD  Vitamin D , Ergocalciferol , (DRISDOL ) 1.25 MG (50000 UNIT) CAPS capsule Take 1 capsule (50,000 Units total) by mouth every 7 (seven) days. 09/20/22   Tanda Bleacher, MD    Allergies: Labetalol     Review of Systems  Neurological:  Positive for  headaches.    Updated Vital Signs BP 138/84   Pulse 88   Temp 98.3 F (36.8 C) (Oral)   Resp 16   SpO2 100%   Physical Exam Vitals and nursing note reviewed.  Constitutional:      General: She is not in acute distress.    Appearance: Normal appearance. She is not ill-appearing, toxic-appearing or diaphoretic.  HENT:     Head: Normocephalic and atraumatic.  Eyes:     General: Vision grossly intact. No visual field deficit or scleral icterus.    Extraocular Movements: Extraocular  movements intact.     Right eye: Normal extraocular motion and no nystagmus.     Left eye: Normal extraocular motion and no nystagmus.     Conjunctiva/sclera: Conjunctivae normal.     Pupils: Pupils are equal, round, and reactive to light.  Cardiovascular:     Rate and Rhythm: Normal rate and regular rhythm.     Heart sounds: Normal heart sounds.  Pulmonary:     Effort: Pulmonary effort is normal. No respiratory distress.     Breath sounds: Normal breath sounds. No wheezing, rhonchi or rales.  Musculoskeletal:        General: Normal range of motion.     Cervical back: Normal range of motion. No rigidity or tenderness.     Right lower leg: No edema.     Left lower leg: No edema.     Comments: Able to raise bilateral upper extremities above head, patient able to extend bilateral upper extremities out in front of her and hold them there against resistance, dorsi flexion and plantarflexion of lower extremities intact in bed, patient able to raise bilateral lower extremities against resistance while in bed  No appreciated tenderness with palpation of left shoulder, left arm, left hip, left leg  Patient denies movement making pain worse  Neurological:     General: No focal deficit present.     Mental Status: She is alert and oriented to person, place, and time.     GCS: GCS eye subscore is 4. GCS verbal subscore is 5. GCS motor subscore is 6.     Cranial Nerves: No cranial nerve deficit, dysarthria or facial asymmetry.     Sensory: Sensation is intact.     Motor: Motor function is intact. No weakness or pronator drift.     Coordination: Coordination is intact.     (all labs ordered are listed, but only abnormal results are displayed) Labs Reviewed  BASIC METABOLIC PANEL WITH GFR - Abnormal; Notable for the following components:      Result Value   Glucose, Bld 103 (*)    All other components within normal limits  URINALYSIS, ROUTINE W REFLEX MICROSCOPIC - Abnormal; Notable for the  following components:   APPearance HAZY (*)    Leukocytes,Ua TRACE (*)    All other components within normal limits  RAPID URINE DRUG SCREEN, HOSP PERFORMED - Abnormal; Notable for the following components:   Tetrahydrocannabinol POSITIVE (*)    All other components within normal limits  TROPONIN I (HIGH SENSITIVITY) - Abnormal; Notable for the following components:   Troponin I (High Sensitivity) 20 (*)    All other components within normal limits  TROPONIN I (HIGH SENSITIVITY) - Abnormal; Notable for the following components:   Troponin I (High Sensitivity) 21 (*)    All other components within normal limits  CBC    EKG: EKG Interpretation Date/Time:  Saturday July 30 2023 13:54:18 EDT Ventricular Rate:  129  PR Interval:  140 QRS Duration:  72 QT Interval:  312 QTC Calculation: 457 R Axis:   -58  Text Interpretation: Sinus tachycardia Biatrial enlargement Left anterior fascicular block Left ventricular hypertrophy ( R in aVL , Cornell product ) Anteroseptal infarct , age undetermined Abnormal ECG Confirmed by Melvenia Motto [phone]) on 07/30/2023 2:12:06 PM  Radiology: CT Head Wo Contrast Result Date: 07/30/2023 CLINICAL DATA:  Headache, new onset (Age >= 51y) EXAM: CT HEAD WITHOUT CONTRAST TECHNIQUE: Contiguous axial images were obtained from the base of the skull through the vertex without intravenous contrast. RADIATION DOSE REDUCTION: This exam was performed according to the departmental dose-optimization program which includes automated exposure control, adjustment of the mA and/or kV according to patient size and/or use of iterative reconstruction technique. COMPARISON:  10/11/2019 FINDINGS: Brain: No acute intracranial abnormality. Specifically, no hemorrhage, hydrocephalus, mass lesion, acute infarction, or significant intracranial injury. Vascular: No hyperdense vessel or unexpected calcification. Skull: No acute calvarial abnormality. Sinuses/Orbits: No acute findings Other: None  IMPRESSION: Normal study. Electronically Signed   By: Franky Crease M.D.   On: 07/30/2023 16:52   DG Chest 2 View Result Date: 07/30/2023 CLINICAL DATA:  Tachypnea, chest pain EXAM: CHEST - 2 VIEW COMPARISON:  07/27/2023 FINDINGS: Heart and mediastinal contours are within normal limits. No focal opacities or effusions. No acute bony abnormality. IMPRESSION: No active cardiopulmonary disease. Electronically Signed   By: Franky Crease M.D.   On: 07/30/2023 15:02     Procedures   Medications Ordered in the ED  diphenhydrAMINE  (BENADRYL ) injection 12.5 mg (12.5 mg Intravenous Given 07/30/23 1503)  metoCLOPramide  (REGLAN ) injection 10 mg (10 mg Intravenous Given 07/30/23 1504)  acetaminophen  (TYLENOL ) tablet 1,000 mg (1,000 mg Oral Given 07/30/23 1503)  hydrALAZINE  (APRESOLINE ) tablet 50 mg (50 mg Oral Given 07/30/23 1854)  cloNIDine  (CATAPRES ) tablet 0.2 mg (0.2 mg Oral Given 07/30/23 1854)    Clinical Course as of 07/30/23 2125  Sat Jul 30, 2023  1930 Waiting on 2nd trop [CH]    Clinical Course User Index [CH] Dinorah Masullo, Terrall FALCON, PA-C                                 Medical Decision Making Amount and/or Complexity of Data Reviewed Labs: ordered. Radiology: ordered.  Risk OTC drugs. Prescription drug management.   Patient presents to the ED for concern of headache, hypertension, left arm, left leg pain, this involves an extensive number of treatment options, and is a complaint that carries with it a high risk of complications and morbidity.  The differential diagnosis includes brain bleed, stroke, essential hypertension, exacerbation of chronic rheumatoid arthritis or other autoimmune condition, etc.   Co morbidities that complicate the patient evaluation  Extensive autoimmune history, history of resistant hypertension being followed by primary care as well as cardiology outpatient   Additional history obtained:  Previous visit on 07/27/2023 reviewed including history, physical exam, labs,  and discharge instructions.   Lab Tests:  I Ordered, and personally interpreted labs.  The pertinent results include: BC and CMP unremarkable, troponin mildly elevated at 20, second troponin 21, UA not consistent with obvious infection, rapid UDS positive for [facility]   Imaging Studies ordered:  I ordered imaging studies including CT head, chest x-ray I independently visualized and interpreted imaging which showed: CT head without contrast: Normal study Chest x-ray: No active cardiopulmonary disease, no evidence of infiltrate or pneumonia I agree with the radiologist interpretation  Cardiac Monitoring:  The patient was maintained on a cardiac monitor.  I personally viewed and interpreted the cardiac monitored which showed an underlying rhythm of: Sinus   Medicines ordered and prescription drug management:  I ordered medication including hydralazine  and clonidine  for hypertension, Benadryl , Tylenol , Reglan  for headache Reevaluation of the patient after these medicines showed that the patient improved I have reviewed the patients home medicines and have made adjustments as needed   Test Considered:  MRI brain: Declined at this time as patient denies visual disturbances, has no strokelike symptoms, no obvious deficits, no unilateral weakness   Critical Interventions:  None   Problem List / ED Course:  62 old female, extensive past medical history of autoimmune disorders including rheumatoid arthritis, lupus, etc.  Presents to the emergency department with chief complaint of headache, hypertension, left arm, left leg pain. Denies strokelike symptoms, no obvious facial droop, slurred speech, unilateral weakness, changes in sensation Exam overall benign, no obvious abnormalities with auscultation of heart and lungs, range of motion of all 4 extremities as expected, nontender to palpation CBC, BMP, UA unremarkable, rapid UDS positive for THC, troponin mildly elevated at 20, this is  increased from her previous troponin of 6 3 days ago, plan to repeat, patient denies chest pain No obvious abnormalities on CT head or chest x-ray, EKG showed no obvious ST segment elevation Patient given migraine cocktail, patient feeling improved, states her headache is now gone as well as her left arm and left leg pain.  Patient asymptomatic at time of reassessment. Plan to discharge pending second troponin.  Second troponin 21. Patient reassessed and feeling well clinically, denies headache, denies any pain.  Vital signs much more stable, BP at time of discharge 138/84.  Patient offered outpatient clonidine  tablets, however patient states that she was taking these and was just recently switched to clonidine  patch, states that she will follow-up with PCP and cardiology for ongoing diagnosis and treatment of her hypertension. Turn precautions given Patient discharged Based on exam today most likely diagnosis by my interpretation is refractory hypertension, headache, and possible acute exacerbation of chronic autoimmune and polyarthritis diagnoses, no acute cause for symptoms was found today in the emergency department however patient improved clinically and felt ready for discharge.  Recommend follow-up with primary care and specialist as scheduled.    Reevaluation:  After the interventions noted above, I reevaluated the patient and found that they have :improved   Social Determinants of Health:  None   Dispostion:  After consideration of the diagnostic results and the patients response to treatment, I feel that the patient would benefit from discharge and therapy as prescribed.      Final diagnoses:  Acute nonintractable headache, unspecified headache type  Resistant hypertension    ED Discharge Orders     None          Ismael Treptow F, PA-C 07/30/23 2126    Melvenia Motto, MD 07/30/23 2130

## 2023-07-30 NOTE — ED Notes (Signed)
 PT D/C'D AFTER INSTRUCTIONS REVIEWED. PT VERBALIZED UNDERSTANDING. NAD REPORTED OR NOTED AT THIS TIME.

## 2023-07-30 NOTE — ED Triage Notes (Signed)
 Pt c.o pain under her left arm and HTN, pt seen here for HTN 3 days ago. Pt states she took her HTN meds this morning. Pt c.o headache

## 2023-07-30 NOTE — ED Notes (Signed)
 Phlebotomy asked to stick for labs.

## 2023-07-30 NOTE — ED Notes (Signed)
 Patient transported to X-ray

## 2023-08-01 ENCOUNTER — Ambulatory Visit (HOSPITAL_BASED_OUTPATIENT_CLINIC_OR_DEPARTMENT_OTHER): Admitting: Family

## 2023-08-01 ENCOUNTER — Encounter (HOSPITAL_BASED_OUTPATIENT_CLINIC_OR_DEPARTMENT_OTHER): Payer: Self-pay | Admitting: Family

## 2023-08-01 ENCOUNTER — Other Ambulatory Visit (HOSPITAL_BASED_OUTPATIENT_CLINIC_OR_DEPARTMENT_OTHER)

## 2023-08-01 ENCOUNTER — Encounter (HOSPITAL_BASED_OUTPATIENT_CLINIC_OR_DEPARTMENT_OTHER): Payer: Self-pay | Admitting: Cardiovascular Disease

## 2023-08-01 VITALS — BP 162/84 | HR 122 | Ht 64.0 in | Wt 164.0 lb

## 2023-08-01 DIAGNOSIS — R000 Tachycardia, unspecified: Secondary | ICD-10-CM

## 2023-08-01 DIAGNOSIS — I10 Essential (primary) hypertension: Secondary | ICD-10-CM | POA: Diagnosis not present

## 2023-08-01 MED ORDER — CARVEDILOL 12.5 MG PO TABS: 12.5000 mg | ORAL_TABLET | Freq: Two times a day (BID) | ORAL | 3 refills | Status: DC

## 2023-08-01 NOTE — Patient Instructions (Addendum)
 Medication Instructions:   START Carvedilol  12.5mg  one tablet twice per day  *If you need a refill on your cardiac medications before your next appointment, please call your pharmacy*  Follow-Up:  Your next appointment:   As scheduled  Other Instructions  We will send you a MyChart message on Wednesday to check in on your blood pressure.  Be sure to check blood pressure at home at least an hour after your medications and after sitting and resting for 5-10 minutes.

## 2023-08-01 NOTE — Progress Notes (Signed)
 [facility] Assessment:    Date:  08/12/2023   ID:  Denise Carter, DOB 07-20-1970, MRN [id number]  PCP:  Tanda Bleacher, MD  Cardiologist:  Annabella Scarce, MD  Nephrologist:  Referring MD: Tanda Bleacher, MD   CC: Hypertension  History of Present Illness:    Denise Carter is a 53 y.o. female with a hx of  with a hx of RA, Sjorgren's, SLE, resistant hypertension, OSA, tobacco use, varicose veins, hyperlipidemia.   Hypertension initially diagnosed in 2015 at time of SLE diagnosis. Previously seen by Dr. Nishan but has transitioned to Dr. Scarce. Renal artery dopplers 2019 normal. Plasma catecholamines and metanephrines negative. Coronary CTA 05/2019 with normal coronary arteries. Echo 05/2019 normal LVEF 60-65%, gr2DD. Renin and aldosterone normal for hyperaldosteronism but concerning for Cushing syndrome. Referred to endocrinology who did not feel her labs were consistent with Cushing. Sleep study showed OSA recommended for CPAP. Did not tolerate CPAP, would need AHI >15 for Inspire but has not completed in lab PSG.   Echo 04/2021 LVEF 60-65%, no rWMA, mild LVH, gr1DD, mild AI.   At visit 02/2023 Tamsulosin  cahnged to Doxazosin . Last seen 06/28/23 by Dr. Scarce. She was only taking hydralazine  and clonidine  and though prescribed amlodipine , carvedilol , chlorthalidone , doxazosin , irbesartan , spironolactone  not taking. Hydralazine  increased to 50mg  TID, clonidine  patch continued. Atorvastatin  resumed.   Valsartan  previously transitioned to Irvesartan. Spironolactone  and Carvedilol  increased. She was later started on Clonidine  0.1mg  BID and at visit 04/14/22 it was increased to TID. She was referred for renal denervation but her insurance did not cover. At visit 10/26/22 Clonidine  transitioned from tablet to patch for more consistent dosing.   ED visit 07/27/23 after she presented for colonoscopy with SBP 242. Home readings 140-160. She was given dose of Hydralazine  in  ED. On arrival BP 209/146 and prior to discharge 183/121.   ED visit 07/30/23 wit headache, hypertension, left arm and left leg pain. CMP, UA unremarkable. Troponin 20 ? 21. CT head, CXR unremarkable. She was given Hydralazine , Clonidine , Benadryl , Tylenol , Reglan . BP at discharge 138/84.   Presents today for follow up. Working at [employer] 2:30 PM to 11 PM.  Reports BP remains very elevated.  Taking medications morning and evening.  Remains out of Lyrica , Plaquenilil with significant pain. Has been discharged from multiple rheumatology practices.  She also notes episodes of tachycardia.  Reports persistent fatigue, low appetite.  No chest pain, exertional dyspnea, edema.   Previous intolerances: Labetalol  - headaches, ineffective Verapamil Atenolol - worked some in [place] HCTZ - headaches Valsartan  - diarrhea Amlodipine  causes headaches at 10 mg, but tolerates 5mg  Hydralazine  - headaches  Past Medical History:  Diagnosis Date   Atypical chest pain 05/23/2019   Depression 06/28/2023   Essential hypertension 05/23/2019   Hypertension    Lupus    OSA (obstructive sleep apnea) 11/19/2019   Resistant hypertension 05/23/2019   Shortness of breath 05/23/2019   Snoring 05/23/2019   Thyroid  disease    Varicose veins of left lower extremity 01/26/2020    Past Surgical History:  Procedure Laterality Date   ABDOMINAL HYSTERECTOMY      Current Medications: Current Meds  Medication Sig   albuterol  (VENTOLIN  HFA) 108 (90 Base) MCG/ACT inhaler INHALE 2 PUFFS BY MOUTH INTO THE LUNGS EVERY 6 HOURS AS NEEDED FOR WHEEZING OR SHORTNESS OF BREATH   aspirin  (EC-81 ASPIRIN ) 81 MG EC tablet Take 1 tablet (81 mg total) by mouth daily. Swallow whole.   atorvastatin  (LIPITOR) 40 MG tablet  Take 1 tablet (40 mg total) by mouth daily.   budesonide-glycopyrrolate-formoterol (BREZTRI  AEROSPHERE) 160-9-4.8 MCG/ACT AERO inhaler Inhale 2 puffs by mouth into the lungs in the morning and at bedtime.   cloNIDine   (CATAPRES  - DOSED IN MG/24 HR) 0.2 mg/24hr patch Place 0.2 mg onto the skin once a week.   fluticasone  (FLONASE ) 50 MCG/ACT nasal spray Place 2 sprays into both nostrils daily.   ibuprofen (ADVIL) 200 MG tablet Take 200 mg by mouth every 6 (six) hours as needed.   KLS OMPERAZOLE 20 MG TBEC Take 1 tablet by mouth daily.   [DISCONTINUED] carvedilol  (COREG ) 12.5 MG tablet Take 1 tablet (12.5 mg total) by mouth 2 (two) times daily.   [DISCONTINUED] hydrALAZINE  (APRESOLINE ) 50 MG tablet Take 1 tablet (50 mg total) by mouth 3 (three) times daily.     Allergies:   Labetalol    Social History   Socioeconomic History   Marital status: Married    Spouse name: Not on file   Number of children: Not on file   Years of education: Not on file   Highest education level: GED or equivalent  Occupational History   Not on file  Tobacco Use   Smoking status: Every Day    Current packs/day: 1.00    Average packs/day: 1 pack/day for 32.5 years (32.5 ttl pk-yrs)    Types: Cigarettes    Start date: 01/26/1991   Smokeless tobacco: Never   Tobacco comments:    Patient is participating in health coaching for smoking cessation.   Vaping Use   Vaping status: Never Used  Substance and Sexual Activity   Alcohol use: No   Drug use: No   Sexual activity: Yes    Birth control/protection: None  Other Topics Concern   Not on file  Social History Narrative   Right handed      Highest level of edu- 12th grade      7 children      Apartment- 2nd floor   Social Drivers of Health   Financial Resource Strain: High Risk (06/29/2023)   Overall Financial Resource Strain (CARDIA)    Difficulty of Paying Living Expenses: Very hard  Food Insecurity: Food Insecurity Present (06/29/2023)   Hunger Vital Sign    Worried About Running Out of Food in the Last Year: Sometimes true    Ran Out of Food in the Last Year: Sometimes true  Transportation Needs: No Transportation Needs (06/29/2023)   PRAPARE - Therapist, art (Medical): No    Lack of Transportation (Non-Medical): No  Physical Activity: Unknown (03/03/2023)   Exercise Vital Sign    Days of Exercise per Week: 0 days    Minutes of Exercise per Session: Not on file  Stress: Stress Concern Present (06/29/2023)   [employer] of Occupational Health - Occupational Stress Questionnaire    Feeling of Stress : Very much  Social Connections: Moderately Isolated (03/03/2023)   Social Connection and Isolation Panel    Frequency of Communication with Friends and Family: More than three times a week    Frequency of Social Gatherings with Friends and Family: Never    Attends Religious Services: Never    Database administrator or Organizations: No    Attends Engineer, structural: Not on file    Marital Status: Married     Family History: The patient's family history includes Breast cancer (age of onset: 34 - 39) in her maternal aunt; CAD in her maternal grandmother;  Heart failure in her maternal grandmother; Hypertension in her maternal grandmother and mother; Lupus in her maternal grandmother; Stroke in her maternal grandmother. There is no history of Adrenal disorder.  ROS:   Please see the history of present illness.     All other systems reviewed and are negative.  EKGs/Labs/Other Studies Reviewed:         Recent Labs: 09/16/2022: ALT 7; TSH 1.600 07/30/2023: BUN 8; Creatinine, Ser 0.90; Hemoglobin 14.5; Platelets 192; Potassium 3.6; Sodium 143   Recent Lipid Panel    Component Value Date/Time   CHOL 165 09/16/2022 1112   TRIG 83 09/16/2022 1112   HDL 56 09/16/2022 1112   CHOLHDL 2.9 09/16/2022 1112   LDLCALC 93 09/16/2022 1112    Physical Exam:   VS:  BP (!) 162/84   Pulse (!) 122   Ht 5' 4 (1.626 m)   Wt 164 lb (74.4 kg)   SpO2 100%   BMI 28.15 kg/m  , BMI Body mass index is 28.15 kg/m. GENERAL:  Well appearing HEENT: Pupils equal round and reactive, fundi not visualized, oral mucosa  unremarkable NECK:  No jugular venous distention, waveform within normal limits, carotid upstroke brisk and symmetric, no bruits, no thyromegaly LYMPHATICS:  No cervical adenopathy LUNGS:  Clear to auscultation bilaterally HEART:  RRR.  PMI not displaced or sustained,S1 and S2 within normal limits, no S3, no S4, no clicks, no rubs, no murmurs ABD:  Flat, positive bowel sounds normal in frequency in pitch, no bruits, no rebound, no guarding, no midline pulsatile mass, no hepatomegaly, no splenomegaly EXT:  2 plus pulses throughout, no edema, no cyanosis no clubbing SKIN:  No rashes no nodules NEURO:  Cranial nerves II through XII grossly intact, motor grossly intact throughout PSYCH:  Cognitively intact, oriented to person place and time   ASSESSMENT/PLAN:    OSA - Declines CPAP trial due to fear of being strangled.Per previous discussion with  Dr. Shlomo. Will need AHI Of >15 to qualify for [facility]. In lab PSG sleep study previously ordered but not completed.    Tobacco use - Smoking cessation encouraged. Recommend utilization of 1800QUITNOW.  Not interested in further cessation tools at this time.    Resistant HTN - Not at goal.  Multiple prior medication intolerances detailed above.  Hesitant to utilize Imdur as she has had headache with multiple her medications.  Continue Clonidine  0.3mg  weekly patch Start Coreg  12.5mg  BID Continue Hydralazine  50mg  TID Discussed to monitor BP at home at least 2 hours after medications and sitting for 5-10 minutes.  Her pharmacy does not offer adherence packaging Consider resubmit for RDN when procedure resumed Secondary hypertension workup: 12/2017 normal renal arteries 05/2020 normal thyroid  function OSA - declines CPAP.   Aortic atherosclerosis / HLD - Continue Atorvastatin  40mg  daily.   Centrilobular emphysema - on Trelegy and PRN Albuterol  per Dr. Neda.    Screening for Secondary Hypertension:     Relevant Labs/Studies:    Latest Ref  Rng & Units 07/30/2023    1:58 PM 07/27/2023    4:18 PM 09/16/2022   11:12 AM  Basic Labs  Sodium 135 - 145 mmol/L 143  141  142   Potassium 3.5 - 5.1 mmol/L 3.6  3.4  4.3   Creatinine 0.44 - 1.00 mg/dL 9.09  9.05  9.15        Latest Ref Rng & Units 09/16/2022   11:12 AM 06/05/2020    7:56 AM  Thyroid    TSH 0.450 - 4.500  uIU/mL 1.600  1.82        Latest Ref Rng & Units 08/03/2019    2:01 PM 07/23/2019    2:04 PM 05/23/2019    9:25 AM  Renin/Aldosterone   Aldosterone ** ng/dL 3  1  3.1   Renin 9.832 - 5.380 ng/mL/hr   <0.167   Aldos/Renin Ratio 0.0 - 30.0   >18.6        Latest Ref Rng & Units 07/23/2019    2:04 PM 05/23/2019    9:25 AM  Metanephrines/Catecholamines   Epinephrine pg/mL <20    Norepinephrine pg/mL 319    Dopamine pg/mL <10    Metanephrines <=57 pg/mL 56  51.8   Normetanephrines  <=148 pg/mL 84  103.3           01/23/2018   12:26 PM  Renovascular   Renal Artery US  Completed Yes     Disposition:    FU with APP in 2 weeks as scheduled and with Dr. Raford in September in [facility]    Medication Adjustments/Labs and Tests Ordered: Current medicines are reviewed at length with the patient today.  Concerns regarding medicines are outlined above.  Orders Placed This Encounter  Procedures   LONG TERM MONITOR (3-14 DAYS)   Meds ordered this encounter  Medications   DISCONTD: carvedilol  (COREG ) 12.5 MG tablet    Sig: Take 1 tablet (12.5 mg total) by mouth 2 (two) times daily.    Dispense:  180 tablet    Refill:  3    Supervising Provider:   LONNI SLAIN [[id number]]     Signed, Reche GORMAN Finder, NP  08/12/2023 1:30 PM    [facility]

## 2023-08-03 ENCOUNTER — Encounter (HOSPITAL_BASED_OUTPATIENT_CLINIC_OR_DEPARTMENT_OTHER): Payer: Self-pay

## 2023-08-03 NOTE — Telephone Encounter (Signed)
 Please review and advise.

## 2023-08-10 ENCOUNTER — Other Ambulatory Visit: Payer: Self-pay | Admitting: Physician Assistant

## 2023-08-10 DIAGNOSIS — E782 Mixed hyperlipidemia: Secondary | ICD-10-CM

## 2023-08-12 ENCOUNTER — Encounter (HOSPITAL_BASED_OUTPATIENT_CLINIC_OR_DEPARTMENT_OTHER): Payer: Self-pay

## 2023-08-12 ENCOUNTER — Encounter (HOSPITAL_BASED_OUTPATIENT_CLINIC_OR_DEPARTMENT_OTHER): Payer: Self-pay | Admitting: Family

## 2023-08-12 ENCOUNTER — Ambulatory Visit (INDEPENDENT_AMBULATORY_CARE_PROVIDER_SITE_OTHER): Admitting: Family

## 2023-08-12 ENCOUNTER — Telehealth: Payer: Self-pay | Admitting: Cardiovascular Disease

## 2023-08-12 VITALS — BP 170/98 | HR 78 | Ht 64.0 in | Wt 166.0 lb

## 2023-08-12 DIAGNOSIS — E559 Vitamin D deficiency, unspecified: Secondary | ICD-10-CM

## 2023-08-12 DIAGNOSIS — R5383 Other fatigue: Secondary | ICD-10-CM

## 2023-08-12 DIAGNOSIS — I10 Essential (primary) hypertension: Secondary | ICD-10-CM

## 2023-08-12 MED ORDER — HYDRALAZINE HCL 100 MG PO TABS: 100.0000 mg | ORAL_TABLET | Freq: Three times a day (TID) | ORAL | 3 refills | Status: DC

## 2023-08-12 MED ORDER — CARVEDILOL 25 MG PO TABS
25.0000 mg | ORAL_TABLET | Freq: Two times a day (BID) | ORAL | 3 refills | Status: AC
Start: 2023-08-12 — End: 2023-11-10

## 2023-08-12 NOTE — Progress Notes (Signed)
 Advanced Hypertension Clinic Assessment:    Date:  08/12/2023   ID:  [name], DOB Sep 01, 1970, MRN [id number]  PCP:  Denise Bleacher, MD  Cardiologist:  Denise Scarce, MD  Nephrologist:  Referring MD: Denise Bleacher, MD   CC: Hypertension  History of Present Illness:    Denise Carter is a 53 y.o. female with a hx of  with a hx of RA, Sjorgren's, SLE, resistant hypertension, OSA, tobacco use, varicose veins, hyperlipidemia.   Hypertension initially diagnosed in 2015 at time of SLE diagnosis. Previously seen by Dr. Nishan but has transitioned to Dr. Scarce. Renal artery dopplers 2019 normal. Plasma catecholamines and metanephrines negative. Coronary CTA 05/2019 with normal coronary arteries. Echo 05/2019 normal LVEF 60-65%, gr2DD. Renin and aldosterone normal for hyperaldosteronism but concerning for Cushing syndrome. Referred to endocrinology who did not feel her labs were consistent with Cushing. Sleep study showed OSA recommended for CPAP. Did not tolerate CPAP, would need AHI >15 for Inspire but has not completed in lab PSG.   Echo 04/2021 LVEF 60-65%, no rWMA, mild LVH, gr1DD, mild AI.   At visit 02/2023 Tamsulosin  cahnged to Doxazosin . Last seen 06/28/23 by Dr. Scarce. She was only taking hydralazine  and clonidine  and though prescribed amlodipine , carvedilol , chlorthalidone , doxazosin , irbesartan , spironolactone  not taking. Hydralazine  increased to 50mg  TID, clonidine  patch continued. Atorvastatin  resumed.   Valsartan  previously transitioned to Irvesartan. Spironolactone  and Carvedilol  increased. She was later started on Clonidine  0.1mg  BID and at visit 04/14/22 it was increased to TID. She was referred for renal denervation but her insurance did not cover. At visit 10/26/22 Clonidine  transitioned from tablet to patch for more consistent dosing.   ED visit 07/27/23 and 07/30/23 with elevated BP. At visit 08/01/23 due to hypertension and tachycardia, Coreg  12.5mg  BID initiated.  Later increased to 25mg  BID.   Presents today for follow up. BP at home 150-170s. Goes into work at 2:30pm. She takes her second dose of medication prior to leaving for work as does not leave work until 11 pm. Checks blood pressure before medications in the morning. Taking hydralzine sporadically in the evening so more often getting 2 doses rather than 3.   She experiences significant fatigue, feeling tired throughout the day. She wakes up at 2:30 or 3:00 AM and again at 7:30 or 8:00 AM, feeling tired upon waking. She attributes some fatigue to night sweats and hot flashes. She also experiences a lack of appetite and taste, eating only once a day, primarily consuming fruit.  She experiences headaches that start at the back of her head and improve with Excedrin. She has not seen a neurologist recently for her headaches. Additionally remains off her rheumatologic agents as has not re-established with rheumatology.    Previous intolerances: Labetalol  - headaches, ineffective Verapamil Atenolol - worked some in [place] HCTZ - headaches Valsartan  - diarrhea Amlodipine  causes headaches at 10 mg, but tolerates 5mg  Hydralazine  - headaches Doxazosin  Spironolactone   Past Medical History:  Diagnosis Date   Atypical chest pain 05/23/2019   Depression 06/28/2023   Essential hypertension 05/23/2019   Hypertension    Lupus    OSA (obstructive sleep apnea) 11/19/2019   Resistant hypertension 05/23/2019   Shortness of breath 05/23/2019   Snoring 05/23/2019   Thyroid  disease    Varicose veins of left lower extremity 01/26/2020    Past Surgical History:  Procedure Laterality Date   ABDOMINAL HYSTERECTOMY      Current Medications: Current Meds  Medication Sig   albuterol  (VENTOLIN  HFA)  108 (90 Base) MCG/ACT inhaler INHALE 2 PUFFS BY MOUTH INTO THE LUNGS EVERY 6 HOURS AS NEEDED FOR WHEEZING OR SHORTNESS OF BREATH   aspirin  (EC-81 ASPIRIN ) 81 MG EC tablet Take 1 tablet (81 mg total) by mouth daily.  Swallow whole.   atorvastatin  (LIPITOR) 40 MG tablet Take 1 tablet (40 mg total) by mouth daily.   budesonide-glycopyrrolate-formoterol (BREZTRI  AEROSPHERE) 160-9-4.8 MCG/ACT AERO inhaler Inhale 2 puffs by mouth into the lungs in the morning and at bedtime.   carvedilol  (COREG ) 12.5 MG tablet Take 1 tablet (12.5 mg total) by mouth 2 (two) times daily.   cloNIDine  (CATAPRES  - DOSED IN MG/24 HR) 0.2 mg/24hr patch Place 0.2 mg onto the skin once a week.   fluticasone  (FLONASE ) 50 MCG/ACT nasal spray Place 2 sprays into both nostrils daily.   hydrALAZINE  (APRESOLINE ) 50 MG tablet Take 1 tablet (50 mg total) by mouth 3 (three) times daily.   ibuprofen (ADVIL) 200 MG tablet Take 200 mg by mouth every 6 (six) hours as needed.   KLS OMPERAZOLE 20 MG TBEC Take 1 tablet by mouth daily.   pregabalin  (LYRICA ) 200 MG capsule Take 1 capsule (200 mg total) by mouth 2 (two) times daily.     Allergies:   Labetalol    Social History   Socioeconomic History   Marital status: Married    Spouse name: Not on file   Number of children: Not on file   Years of education: Not on file   Highest education level: GED or equivalent  Occupational History   Not on file  Tobacco Use   Smoking status: Every Day    Current packs/day: 1.00    Average packs/day: 1 pack/day for 32.5 years (32.5 ttl pk-yrs)    Types: Cigarettes    Start date: 01/26/1991   Smokeless tobacco: Never   Tobacco comments:    Patient is participating in health coaching for smoking cessation.   Vaping Use   Vaping status: Never Used  Substance and Sexual Activity   Alcohol use: No   Drug use: No   Sexual activity: Yes    Birth control/protection: None  Other Topics Concern   Not on file  Social History Narrative   Right handed      Highest level of edu- 12th grade      7 children      Apartment- 2nd floor   Social Drivers of Health   Financial Resource Strain: High Risk (06/29/2023)   Overall Financial Resource Strain (CARDIA)     Difficulty of Paying Living Expenses: Very hard  Food Insecurity: Food Insecurity Present (06/29/2023)   Hunger Vital Sign    Worried About Running Out of Food in the Last Year: Sometimes true    Ran Out of Food in the Last Year: Sometimes true  Transportation Needs: No Transportation Needs (06/29/2023)   PRAPARE - Administrator, Civil Service (Medical): No    Lack of Transportation (Non-Medical): No  Physical Activity: Unknown (03/03/2023)   Exercise Vital Sign    Days of Exercise per Week: 0 days    Minutes of Exercise per Session: Not on file  Stress: Stress Concern Present (06/29/2023)   [employer] of Occupational Health - Occupational Stress Questionnaire    Feeling of Stress : Very much  Social Connections: Moderately Isolated (03/03/2023)   Social Connection and Isolation Panel    Frequency of Communication with Friends and Family: More than three times a week    Frequency of Social  Gatherings with Friends and Family: Never    Attends Religious Services: Never    Database administrator or Organizations: No    Attends Engineer, structural: Not on file    Marital Status: Married     Family History: The patient's family history includes Breast cancer (age of onset: 68 - 17) in her maternal aunt; CAD in her maternal grandmother; Heart failure in her maternal grandmother; Hypertension in her maternal grandmother and mother; Lupus in her maternal grandmother; Stroke in her maternal grandmother. There is no history of Adrenal disorder.  ROS:   Please see the history of present illness.     All other systems reviewed and are negative.  EKGs/Labs/Other Studies Reviewed:         Recent Labs: 09/16/2022: ALT 7; TSH 1.600 07/30/2023: BUN 8; Creatinine, Ser 0.90; Hemoglobin 14.5; Platelets 192; Potassium 3.6; Sodium 143   Recent Lipid Panel    Component Value Date/Time   CHOL 165 09/16/2022 1112   TRIG 83 09/16/2022 1112   HDL 56 09/16/2022 1112   CHOLHDL 2.9  09/16/2022 1112   LDLCALC 93 09/16/2022 1112    Physical Exam:   VS:  BP (!) 170/98 (BP Location: Right Arm, Patient Position: Sitting, Cuff Size: Normal)   Pulse 78   Ht 5' 4 (1.626 m)   Wt 166 lb (75.3 kg)   SpO2 98%   BMI 28.49 kg/m  , BMI Body mass index is 28.49 kg/m. GENERAL:  Well appearing HEENT: Pupils equal round and reactive, fundi not visualized, oral mucosa unremarkable NECK:  No jugular venous distention, waveform within normal limits, carotid upstroke brisk and symmetric, no bruits, no thyromegaly LYMPHATICS:  No cervical adenopathy LUNGS:  Clear to auscultation bilaterally HEART:  RRR.  PMI not displaced or sustained,S1 and S2 within normal limits, no S3, no S4, no clicks, no rubs, no murmurs ABD:  Flat, positive bowel sounds normal in frequency in pitch, no bruits, no rebound, no guarding, no midline pulsatile mass, no hepatomegaly, no splenomegaly EXT:  2 plus pulses throughout, no edema, no cyanosis no clubbing SKIN:  No rashes no nodules NEURO:  Cranial nerves II through XII grossly intact, motor grossly intact throughout PSYCH:  Cognitively intact, oriented to person place and time   ASSESSMENT/PLAN:    OSA - Previously declined CPAP trial due to fear of being strangled.Per previous discussion with  Dr. Shlomo. Will need AHI Of >15 to qualify for Inspire. In lab PSG sleep study previously ordered but not completed.  Untreated OSA contributory to hypertension, discussed at follow-up.   Tobacco use - Smoking cessation encouraged. Recommend utilization of 1800QUITNOW. Not interested in further cessation tools at this time.   Fatigue / Vitamin D  deficiency - vitamin D  level and thyroid  panel today for evaluation.   Resistant HTN - Not at goal in setting of inconsistent dosing of Hydralazine . Continue Clonidine  0.3mg  patch Increase Hydralazine  to 100mg  TID. Discussed need for TID dosing and importance.  Continue Coreg  25mg  BID Discussed to monitor BP at home at  least 2 hours after medications and sitting for 5-10 minutes. (Presently checking prior to meds)  Multiple prior medication intolerances detailed above.  Hesitant to utilize Imdur as she has had headache with multiple her medications. Consider RDN when procedure resumed  Will send MyChart message in 1 week to check in.  Secondary hypertension workup: 12/2017 normal renal arteries 05/2020 normal thyroid  function OSA - declines CPAP.   Aortic atherosclerosis / HLD - Continue Atorvastatin   40mg  daily.   Centrilobular emphysema - on Trelegy and PRN Albuterol  per Dr. Neda.  Lupus / Sjogren's  - encouraged to re-establish with rheumatology  Headache - ?Migraine. Previously followed with neurology. Recommended to schedule visit with PCP to discuss    Screening for Secondary Hypertension:     Relevant Labs/Studies:    Latest Ref Rng & Units 07/30/2023    1:58 PM 07/27/2023    4:18 PM 09/16/2022   11:12 AM  Basic Labs  Sodium 135 - 145 mmol/L 143  141  142   Potassium 3.5 - 5.1 mmol/L 3.6  3.4  4.3   Creatinine 0.44 - 1.00 mg/dL 9.09  9.05  9.15        Latest Ref Rng & Units 09/16/2022   11:12 AM 06/05/2020    7:56 AM  Thyroid    TSH 0.450 - 4.500 uIU/mL 1.600  1.82        Latest Ref Rng & Units 08/03/2019    2:01 PM 07/23/2019    2:04 PM 05/23/2019    9:25 AM  Renin/Aldosterone   Aldosterone ** ng/dL 3  1  3.1   Renin 9.832 - 5.380 ng/mL/hr   <0.167   Aldos/Renin Ratio 0.0 - 30.0   >18.6        Latest Ref Rng & Units 07/23/2019    2:04 PM 05/23/2019    9:25 AM  Metanephrines/Catecholamines   Epinephrine pg/mL <20    Norepinephrine pg/mL 319    Dopamine pg/mL <10    Metanephrines <=57 pg/mL 56  51.8   Normetanephrines  <=148 pg/mL 84  103.3           01/23/2018   12:26 PM  Renovascular   Renal Artery US  Completed Yes     Disposition:    FU with Dr. Raford in September in [facility]    Medication Adjustments/Labs and Tests Ordered: Current medicines  are reviewed at length with the patient today.  Concerns regarding medicines are outlined above.  No orders of the defined types were placed in this encounter.  No orders of the defined types were placed in this encounter.    Signed, Reche GORMAN Finder, NP  08/12/2023 10:27 AM    [facility]

## 2023-08-12 NOTE — Telephone Encounter (Signed)
 Order placed for [facility].

## 2023-08-12 NOTE — Patient Instructions (Addendum)
 Medication Instructions:   CHANGE Hydralazine  to 100mg  three times per day   CONTINUE Carvedilol  25mg  tablet twice daily Updated prescription sent to pharmacy  CONTINUE Clonidine  0.3 patch once per week   Labwork: Your physician recommends that you return for lab work today: Vitamin D     Follow-Up: Please follow up as scheduled in September in [facility] with Dr. Raford   Special Instructions:    Recommend setting an alarm to help remember your evening dose of Hydralazine .

## 2023-08-12 NOTE — Telephone Encounter (Signed)
 They need a [facility] order for the vitamin D , [facility] order was sent. Please advise

## 2023-08-15 ENCOUNTER — Other Ambulatory Visit: Payer: Self-pay | Admitting: Family

## 2023-08-16 ENCOUNTER — Other Ambulatory Visit (HOSPITAL_BASED_OUTPATIENT_CLINIC_OR_DEPARTMENT_OTHER): Payer: Self-pay

## 2023-08-16 ENCOUNTER — Ambulatory Visit (HOSPITAL_BASED_OUTPATIENT_CLINIC_OR_DEPARTMENT_OTHER): Payer: Self-pay | Admitting: Family

## 2023-08-16 ENCOUNTER — Encounter (HOSPITAL_BASED_OUTPATIENT_CLINIC_OR_DEPARTMENT_OTHER): Payer: Self-pay

## 2023-08-16 DIAGNOSIS — E559 Vitamin D deficiency, unspecified: Secondary | ICD-10-CM

## 2023-08-16 LAB — THYROID PANEL WITH TSH
Free Thyroxine Index: 1.7 (ref 1.2–4.9)
T3 Uptake Ratio: 27 % (ref 24–39)
T4, Total: 6.4 ug/dL (ref 4.5–12.0)
TSH: 1.7 u[IU]/mL (ref 0.450–4.500)

## 2023-08-16 LAB — VITAMIN D 25 HYDROXY (VIT D DEFICIENCY, FRACTURES): Vit D, 25-Hydroxy: 25.1 ng/mL — ABNORMAL LOW (ref 30.0–100.0)

## 2023-08-16 MED ORDER — VITAMIN D (ERGOCALCIFEROL) 1.25 MG (50000 UNIT) PO CAPS
50000.0000 [IU] | ORAL_CAPSULE | ORAL | 3 refills | Status: DC
  Filled 2023-08-16: qty 4, 28d supply, fill #0
  Filled 2023-09-06 – 2023-09-19 (×2): qty 4, 28d supply, fill #1
  Filled 2023-10-14: qty 4, 28d supply, fill #2

## 2023-08-17 LAB — THYROID PANEL WITH TSH

## 2023-08-18 ENCOUNTER — Telehealth: Payer: Self-pay | Admitting: Cardiovascular Disease

## 2023-08-18 ENCOUNTER — Ambulatory Visit
Admission: RE | Admit: 2023-08-18 | Discharge: 2023-08-18 | Disposition: A | Discharge status: Home / Self Care | Source: Ambulatory Visit | Attending: Physician Assistant | Admitting: Physician Assistant

## 2023-08-18 DIAGNOSIS — E782 Mixed hyperlipidemia: Secondary | ICD-10-CM

## 2023-08-18 LAB — VITAMIN D 25 HYDROXY (VIT D DEFICIENCY, FRACTURES)

## 2023-08-18 NOTE — Telephone Encounter (Signed)
 Monitor under media tab reveals:  Predominantly NSR average heart rate of 91 bpm. There was 1 run of SVT lasting 5 beats. One episode of third degree AV block 6 seconds (6:19a) and second degree AV block (08/06/23 at 3:10am) also present. These were not associated with triggered episodes.   Discussed with Daphne Barrack, PA on EP team, CHB while sleeping is a normal variant due to vascular tone. All slow episodes were doing sleep. Likely related to her untreated OSA which she has declined CPAP and Inspire device.   No indication for further intervention. Continue present medication regimen. Miss Shimada was made aware via MyChart.  Marvie Calender S Jaylean Buenaventura, NP

## 2023-08-18 NOTE — Telephone Encounter (Signed)
 Denise Carter with [facility] reports End of Monitor Report- Alert  Zio- completed study- wore for 7 days   Episode of complete heart block- 08/02/23 at 0619am- hr of 30 beats per min and lasted 5.9 seconds- on pg 15 strip 6.      Cardiac Monitor Alert  Date of alert:  08/18/2023   Patient Name: Denise Carter  DOB: 1970-09-01  MRN: [id number]   [facility] Cardiologist: Annabella Scarce, MD  [facility] EP:  None    Monitor Information: Long Term Monitor [ZioXT]  Reason:  Tachycardia Ordering provider:  Reche Finder, NP :1}  Alert Complete Heart Block This is the 1st alert for this rhythm.  Episode of complete heart block- 08/02/23 at 0619am- hr of 30beats per min and lasted 5.9 seconds- on pg 15 strip 6.   Next Cardiology Appointment   Date:  10/17/23  Provider:  Dr Scarce    Comer LITTIE Ebbs, RN  08/18/2023 3:30 PM

## 2023-08-18 NOTE — Telephone Encounter (Signed)
 [facility] calling with abnormal zio patch readings.

## 2023-08-29 ENCOUNTER — Telehealth: Payer: Self-pay

## 2023-08-29 NOTE — Telephone Encounter (Signed)
 Copied from CRM [id number]. Topic: Medical Record Request - Other >> Aug 26, 2023  3:04 PM Chantha C wrote: Reason for CRM: Dr. Marinda Gravely [phone] regarding patient's FMLA certification needs to speak with Dr. Neda. Informed Dr. Gravely the office is closed and can get the nurse to contact Dr. Mordechai. Dr. Gravely states it's not an urgent matter and can wait for Dr. Cathye call on Monday. Please advise and call back.

## 2023-08-31 ENCOUNTER — Telehealth: Payer: Self-pay

## 2023-08-31 NOTE — Telephone Encounter (Signed)
 Copied from CRM #[id number]. Topic: General - Other >> Aug 30, 2023 11:38 AM Corean SAUNDERS wrote: Reason for CRM:  Dr. Marinda Drones is requesting a call back from Dr. Neda regarding a non - urgent matter for this patients. Please call back at [phone]

## 2023-09-01 NOTE — Telephone Encounter (Signed)
 Left a message for Dr. Marinda Gravely, I will be in the office tomorrow 8/ 8 between 9 and 12

## 2023-09-02 NOTE — Telephone Encounter (Signed)
 Dr Marinda Drones returning call to Dr. Neda Call has been transferred to Dr. Neda

## 2023-09-06 ENCOUNTER — Other Ambulatory Visit (HOSPITAL_BASED_OUTPATIENT_CLINIC_OR_DEPARTMENT_OTHER): Payer: Self-pay

## 2023-09-16 ENCOUNTER — Ambulatory Visit (HOSPITAL_BASED_OUTPATIENT_CLINIC_OR_DEPARTMENT_OTHER): Payer: Self-pay | Admitting: Family

## 2023-09-16 DIAGNOSIS — R000 Tachycardia, unspecified: Secondary | ICD-10-CM

## 2023-09-17 ENCOUNTER — Other Ambulatory Visit (HOSPITAL_BASED_OUTPATIENT_CLINIC_OR_DEPARTMENT_OTHER): Payer: Self-pay

## 2023-09-19 ENCOUNTER — Other Ambulatory Visit (HOSPITAL_BASED_OUTPATIENT_CLINIC_OR_DEPARTMENT_OTHER): Payer: Self-pay

## 2023-10-12 ENCOUNTER — Encounter: Payer: Self-pay | Admitting: Family Medicine

## 2023-10-12 ENCOUNTER — Ambulatory Visit (INDEPENDENT_AMBULATORY_CARE_PROVIDER_SITE_OTHER): Payer: Self-pay | Admitting: Family Medicine

## 2023-10-12 VITALS — BP 201/142 | HR 76 | Ht 64.0 in | Wt 152.4 lb

## 2023-10-12 DIAGNOSIS — Z0000 Encounter for general adult medical examination without abnormal findings: Secondary | ICD-10-CM | POA: Diagnosis not present

## 2023-10-12 DIAGNOSIS — Z13228 Encounter for screening for other metabolic disorders: Secondary | ICD-10-CM

## 2023-10-12 DIAGNOSIS — Z136 Encounter for screening for cardiovascular disorders: Secondary | ICD-10-CM | POA: Diagnosis not present

## 2023-10-12 DIAGNOSIS — Z1329 Encounter for screening for other suspected endocrine disorder: Secondary | ICD-10-CM

## 2023-10-12 DIAGNOSIS — Z130 Encounter for screening for diseases of the blood and blood-forming organs and certain disorders involving the immune mechanism: Secondary | ICD-10-CM

## 2023-10-12 MED ORDER — PREGABALIN 200 MG PO CAPS: 200.0000 mg | ORAL_CAPSULE | Freq: Two times a day (BID) | ORAL | 0 refills | Status: DC

## 2023-10-12 NOTE — Progress Notes (Unsigned)
 Established Patient Office Visit  Subjective    Patient ID: Denise Carter, female    DOB: December 04, 1970  Age: 53 y.o. MRN: [id number]  CC:  Chief Complaint  Patient presents with   Annual Exam    HPI [name] presents for routine annual exam. Patient reports med compliance and denies acute complaints.   Outpatient Encounter Medications as of 10/12/2023  Medication Sig   albuterol  (VENTOLIN  HFA) 108 (90 Base) MCG/ACT inhaler INHALE 2 PUFFS BY MOUTH INTO THE LUNGS EVERY 6 HOURS AS NEEDED FOR WHEEZING OR SHORTNESS OF BREATH   atorvastatin  (LIPITOR) 40 MG tablet Take 1 tablet (40 mg total) by mouth daily.   budesonide-glycopyrrolate-formoterol (BREZTRI  AEROSPHERE) 160-9-4.8 MCG/ACT AERO inhaler Inhale 2 puffs by mouth into the lungs in the morning and at bedtime.   carvedilol  (COREG ) 25 MG tablet Take 1 tablet (25 mg total) by mouth 2 (two) times daily.   cloNIDine  (CATAPRES  - DOSED IN MG/24 HR) 0.2 mg/24hr patch Place 0.2 mg onto the skin once a week.   hydrALAZINE  (APRESOLINE ) 100 MG tablet Take 1 tablet (100 mg total) by mouth 3 (three) times daily.   KLS OMPERAZOLE 20 MG TBEC Take 1 tablet by mouth daily.   Vitamin D , Ergocalciferol , (DRISDOL ) 1.25 MG (50000 UNIT) CAPS capsule Take 1 capsule (50,000 Units total) by mouth every 7 (seven) days.   aspirin  (EC-81 ASPIRIN ) 81 MG EC tablet Take 1 tablet (81 mg total) by mouth daily. Swallow whole. (Patient not taking: Reported on 10/12/2023)   fluticasone  (FLONASE ) 50 MCG/ACT nasal spray Place 2 sprays into both nostrils daily. (Patient not taking: Reported on 10/12/2023)   ibuprofen (ADVIL) 200 MG tablet Take 200 mg by mouth every 6 (six) hours as needed. (Patient not taking: Reported on 10/12/2023)   mirtazapine  (REMERON ) 7.5 MG tablet Take 1 tablet (7.5 mg total) by mouth at bedtime. (Patient not taking: Reported on 10/12/2023)   pregabalin  (LYRICA ) 200 MG capsule Take 1 capsule (200 mg total) by mouth 2 (two) times daily.    [DISCONTINUED] pregabalin  (LYRICA ) 200 MG capsule Take 1 capsule (200 mg total) by mouth 2 (two) times daily.   No facility-administered encounter medications on file as of 10/12/2023.    Past Medical History:  Diagnosis Date   Atypical chest pain 05/23/2019   Depression 06/28/2023   Essential hypertension 05/23/2019   Hypertension    Lupus    OSA (obstructive sleep apnea) 11/19/2019   Resistant hypertension 05/23/2019   Shortness of breath 05/23/2019   Snoring 05/23/2019   Thyroid  disease    Varicose veins of left lower extremity 01/26/2020    Past Surgical History:  Procedure Laterality Date   ABDOMINAL HYSTERECTOMY      Family History  Problem Relation Age of Onset   Hypertension Mother    Breast cancer Maternal Aunt 43 - 49   Hypertension Maternal Grandmother    Lupus Maternal Grandmother    Heart failure Maternal Grandmother    Stroke Maternal Grandmother    CAD Maternal Grandmother    Adrenal disorder Neg Hx     Social History   Socioeconomic History   Marital status: Married    Spouse name: Not on file   Number of children: Not on file   Years of education: Not on file   Highest education level: GED or equivalent  Occupational History   Not on file  Tobacco Use   Smoking status: Every Day    Current packs/day: 1.00    Average  packs/day: 1 pack/day for 32.7 years (32.7 ttl pk-yrs)    Types: Cigarettes    Start date: 01/26/1991   Smokeless tobacco: Never   Tobacco comments:    Patient is participating in health coaching for smoking cessation.   Vaping Use   Vaping status: Never Used  Substance and Sexual Activity   Alcohol use: No   Drug use: No   Sexual activity: Yes    Birth control/protection: None  Other Topics Concern   Not on file  Social History Narrative   Right handed      Highest level of edu- 12th grade      7 children      Apartment- 2nd floor   Social Drivers of Health   Financial Resource Strain: Medium Risk (10/09/2023)    Overall Financial Resource Strain (CARDIA)    Difficulty of Paying Living Expenses: Somewhat hard  Food Insecurity: Food Insecurity Present (10/09/2023)   Hunger Vital Sign    Worried About Running Out of Food in the Last Year: Sometimes true    Ran Out of Food in the Last Year: Never true  Transportation Needs: No Transportation Needs (10/09/2023)   PRAPARE - Administrator, Civil Service (Medical): No    Lack of Transportation (Non-Medical): No  Physical Activity: Inactive (10/09/2023)   Exercise Vital Sign    Days of Exercise per Week: 0 days    Minutes of Exercise per Session: Not on file  Stress: Stress Concern Present (10/09/2023)   [employer] of Occupational Health - Occupational Stress Questionnaire    Feeling of Stress: Very much  Social Connections: Moderately Isolated (10/09/2023)   Social Connection and Isolation Panel    Frequency of Communication with Friends and Family: Twice a week    Frequency of Social Gatherings with Friends and Family: Never    Attends Religious Services: 1 to 4 times per year    Active Member of [employer] or Organizations: No    Attends Banker Meetings: Not on file    Marital Status: Married  Catering manager Violence: Not At Risk (03/03/2023)   Humiliation, Afraid, Rape, and Kick questionnaire    Fear of Current or Ex-Partner: No    Emotionally Abused: No    Physically Abused: No    Sexually Abused: No    Review of Systems  All other systems reviewed and are negative.       Objective    BP (!) 201/142   Pulse 76   Ht 5' 4 (1.626 m)   Wt 152 lb 6.4 oz (69.1 kg)   SpO2 97%   BMI 26.16 kg/m   Physical Exam Vitals and nursing note reviewed.  Constitutional:      General: She is not in acute distress. HENT:     Head: Normocephalic and atraumatic.     Right Ear: Tympanic membrane, ear canal and external ear normal.     Left Ear: Tympanic membrane, ear canal and external ear normal.     Nose: Nose normal.      Mouth/Throat:     Mouth: Mucous membranes are moist.     Pharynx: Oropharynx is clear.  Eyes:     Conjunctiva/sclera: Conjunctivae normal.     Pupils: Pupils are equal, round, and reactive to light.  Neck:     Thyroid : No thyromegaly.  Cardiovascular:     Rate and Rhythm: Normal rate and regular rhythm.     Heart sounds: Normal heart sounds. No murmur heard. Pulmonary:  Effort: Pulmonary effort is normal. No respiratory distress.     Breath sounds: Normal breath sounds.  Abdominal:     General: There is no distension.     Palpations: Abdomen is soft. There is no mass.     Tenderness: There is no abdominal tenderness.  Musculoskeletal:        General: Normal range of motion.     Cervical back: Normal range of motion and neck supple.  Skin:    General: Skin is warm and dry.  Neurological:     General: No focal deficit present.     Mental Status: She is alert and oriented to person, place, and time.  Psychiatric:        Mood and Affect: Mood normal.        Behavior: Behavior normal.         Assessment & Plan:   Annual physical exam -     CMP14+EGFR  Screening for deficiency anemia -     CBC with Differential/Platelet  Encounter for screening for cardiovascular disorders -     Lipid panel  Screening for endocrine/metabolic/immunity disorders -     VITAMIN D  25 Hydroxy (Vit-D Deficiency, Fractures) -     Hemoglobin A1c -     TSH  Other orders -     Pregabalin ; Take 1 capsule (200 mg total) by mouth 2 (two) times daily.  Dispense: 60 capsule; Refill: 0     No follow-ups on file.   Tanda Raguel SQUIBB, MD

## 2023-10-13 ENCOUNTER — Ambulatory Visit: Payer: Self-pay | Admitting: Pulmonary Disease

## 2023-10-13 ENCOUNTER — Telehealth: Payer: Self-pay

## 2023-10-13 ENCOUNTER — Ambulatory Visit: Payer: Self-pay | Admitting: Orthopedic Surgery

## 2023-10-13 ENCOUNTER — Encounter: Payer: Self-pay | Admitting: Pulmonary Disease

## 2023-10-13 ENCOUNTER — Encounter: Payer: Self-pay | Admitting: Family Medicine

## 2023-10-13 VITALS — BP 171/104 | HR 92 | Ht 64.0 in | Wt 154.4 lb

## 2023-10-13 DIAGNOSIS — F17210 Nicotine dependence, cigarettes, uncomplicated: Secondary | ICD-10-CM

## 2023-10-13 DIAGNOSIS — Z8739 Personal history of other diseases of the musculoskeletal system and connective tissue: Secondary | ICD-10-CM | POA: Diagnosis not present

## 2023-10-13 DIAGNOSIS — I10 Essential (primary) hypertension: Secondary | ICD-10-CM

## 2023-10-13 DIAGNOSIS — J439 Emphysema, unspecified: Secondary | ICD-10-CM | POA: Diagnosis not present

## 2023-10-13 DIAGNOSIS — J449 Chronic obstructive pulmonary disease, unspecified: Secondary | ICD-10-CM | POA: Diagnosis not present

## 2023-10-13 DIAGNOSIS — J432 Centrilobular emphysema: Secondary | ICD-10-CM

## 2023-10-13 LAB — CMP14+EGFR
ALT: 6 IU/L (ref 0–32)
AST: 13 IU/L (ref 0–40)
Albumin: 4.1 g/dL (ref 3.8–4.9)
Alkaline Phosphatase: 86 IU/L (ref 49–135)
BUN/Creatinine Ratio: 8 — ABNORMAL LOW (ref 9–23)
BUN: 6 mg/dL (ref 6–24)
Bilirubin Total: 0.6 mg/dL (ref 0.0–1.2)
CO2: 25 mmol/L (ref 20–29)
Calcium: 9.4 mg/dL (ref 8.7–10.2)
Chloride: 100 mmol/L (ref 96–106)
Creatinine, Ser: 0.8 mg/dL (ref 0.57–1.00)
Globulin, Total: 4.3 g/dL (ref 1.5–4.5)
Glucose: 73 mg/dL (ref 70–99)
Potassium: 3.6 mmol/L (ref 3.5–5.2)
Sodium: 141 mmol/L (ref 134–144)
Total Protein: 8.4 g/dL (ref 6.0–8.5)
eGFR: 88 mL/min/1.73 (ref >59)

## 2023-10-13 LAB — CBC WITH DIFFERENTIAL/PLATELET
Basophils Absolute: 0 x10E3/uL (ref 0.0–0.2)
Basos: 1 %
EOS (ABSOLUTE): 0.1 x10E3/uL (ref 0.0–0.4)
Eos: 2 %
Hematocrit: 44.2 % (ref 34.0–46.6)
Hemoglobin: 13.5 g/dL (ref 11.1–15.9)
Immature Grans (Abs): 0 x10E3/uL (ref 0.0–0.1)
Immature Granulocytes: 0 %
Lymphocytes Absolute: 1.9 x10E3/uL (ref 0.7–3.1)
Lymphs: 25 %
MCH: 27.7 pg (ref 26.6–33.0)
MCHC: 30.5 g/dL — ABNORMAL LOW (ref 31.5–35.7)
MCV: 91 fL (ref 79–97)
Monocytes Absolute: 0.6 x10E3/uL (ref 0.1–0.9)
Monocytes: 8 %
Neutrophils Absolute: 4.9 x10E3/uL (ref 1.4–7.0)
Neutrophils: 64 %
Platelets: 195 x10E3/uL (ref 150–450)
RBC: 4.87 x10E6/uL (ref 3.77–5.28)
RDW: 13.9 % (ref 11.7–15.4)
WBC: 7.6 x10E3/uL (ref 3.4–10.8)

## 2023-10-13 LAB — LIPID PANEL
Chol/HDL Ratio: 3.3 ratio (ref 0.0–4.4)
Cholesterol, Total: 179 mg/dL (ref 100–199)
HDL: 54 mg/dL (ref >39)
LDL Chol Calc (NIH): 108 mg/dL — ABNORMAL HIGH (ref 0–99)
Triglycerides: 90 mg/dL (ref 0–149)
VLDL Cholesterol Cal: 17 mg/dL (ref 5–40)

## 2023-10-13 LAB — TSH: TSH: 1.71 u[IU]/mL (ref 0.450–4.500)

## 2023-10-13 LAB — HEMOGLOBIN A1C
Est. average glucose Bld gHb Est-mCnc: 97 mg/dL
Hgb A1c MFr Bld: 5 % (ref 4.8–5.6)

## 2023-10-13 LAB — VITAMIN D 25 HYDROXY (VIT D DEFICIENCY, FRACTURES): Vit D, 25-Hydroxy: 53.8 ng/mL (ref 30.0–100.0)

## 2023-10-13 MED ORDER — ALBUTEROL SULFATE HFA 108 (90 BASE) MCG/ACT IN AERS: 2.0000 | INHALATION_SPRAY | Freq: Four times a day (QID) | RESPIRATORY_TRACT | 2 refills | Status: AC | PRN

## 2023-10-13 MED ORDER — BREZTRI AEROSPHERE 160-9-4.8 MCG/ACT IN AERO: 2.0000 | INHALATION_SPRAY | Freq: Two times a day (BID) | RESPIRATORY_TRACT | 3 refills | Status: AC

## 2023-10-13 NOTE — Telephone Encounter (Signed)
 FMLA - renewal paperwork. completed,signed by Dr.O, made copy & returned to  front desk to collect payment and fax.

## 2023-10-13 NOTE — Patient Instructions (Signed)
 Follow-up in 3 months  We need to repeat your breathing study to see if there is any worsening  Continue using your inhaler  Continue working on quitting smoking  I will look at your records and fill out the paperwork, -can be mailed into-year-old faxed once I am done with it  Will recommend that you follow-up with a rheumatologist for your lupus as this is driving some of your symptoms  Call us  with significant concerns

## 2023-10-13 NOTE — Progress Notes (Signed)
 Denise Carter    [id number]    May 31, 1970  Primary Care Physician:Wilson, Raguel, MD  Referring Physician: Tanda Raguel, MD [address] [place],  [place] [address]  Chief complaint:   Patient is being seen for shortness of breath  HPI:  Intermittent shortness of breath, cough  Was recently scheduled for a colonoscopy that had to be canceled because of uncontrolled blood pressure  Following up with cardiology, has an appointment Coming for management of her hypertension  Cough, shortness of breath with activity  She does work 5 days a week but sometimes with shortness of breath and the environment she works in, calls and intermittently  Sleeps upright to help symptoms of reflux Continues to take PPI  Not feeling acutely ill at present  History of mild obstructive sleep apnea Lupus Chronic fatigue syndrome Polyneuropathy due to lupus History of rheumatoid arthritis Psoriatic arthritis Polyclonal gammopathy  Recent sleep study does reveal mild obstructive sleep apnea  No pertinent occupational history  Recent CT with lung nodules and emphysema  Outpatient Encounter Medications as of 10/13/2023  Medication Sig   albuterol  (VENTOLIN  HFA) 108 (90 Base) MCG/ACT inhaler INHALE 2 PUFFS BY MOUTH INTO THE LUNGS EVERY 6 HOURS AS NEEDED FOR WHEEZING OR SHORTNESS OF BREATH   aspirin  (EC-81 ASPIRIN ) 81 MG EC tablet Take 1 tablet (81 mg total) by mouth daily. Swallow whole.   atorvastatin  (LIPITOR) 40 MG tablet Take 1 tablet (40 mg total) by mouth daily.   budesonide-glycopyrrolate-formoterol (BREZTRI  AEROSPHERE) 160-9-4.8 MCG/ACT AERO inhaler Inhale 2 puffs by mouth into the lungs in the morning and at bedtime.   carvedilol  (COREG ) 25 MG tablet Take 1 tablet (25 mg total) by mouth 2 (two) times daily.   cloNIDine  (CATAPRES  - DOSED IN MG/24 HR) 0.2 mg/24hr patch Place 0.2 mg onto the skin once a week.   fluticasone  (FLONASE ) 50 MCG/ACT nasal spray  Place 2 sprays into both nostrils daily.   hydrALAZINE  (APRESOLINE ) 100 MG tablet Take 1 tablet (100 mg total) by mouth 3 (three) times daily.   ibuprofen (ADVIL) 200 MG tablet Take 200 mg by mouth every 6 (six) hours as needed.   KLS OMPERAZOLE 20 MG TBEC Take 1 tablet by mouth daily.   mirtazapine  (REMERON ) 7.5 MG tablet Take 1 tablet (7.5 mg total) by mouth at bedtime.   pregabalin  (LYRICA ) 200 MG capsule Take 1 capsule (200 mg total) by mouth 2 (two) times daily.   Vitamin D , Ergocalciferol , (DRISDOL ) 1.25 MG (50000 UNIT) CAPS capsule Take 1 capsule (50,000 Units total) by mouth every 7 (seven) days.   No facility-administered encounter medications on file as of 10/13/2023.    Allergies as of 10/13/2023 - Review Complete 10/13/2023  Allergen Reaction Noted   Labetalol  Anxiety 10/25/2018    Past Medical History:  Diagnosis Date   Atypical chest pain 05/23/2019   Depression 06/28/2023   Essential hypertension 05/23/2019   Hypertension    Lupus    OSA (obstructive sleep apnea) 11/19/2019   Resistant hypertension 05/23/2019   Shortness of breath 05/23/2019   Snoring 05/23/2019   Thyroid  disease    Varicose veins of left lower extremity 01/26/2020    Past Surgical History:  Procedure Laterality Date   ABDOMINAL HYSTERECTOMY      Family History  Problem Relation Age of Onset   Hypertension Mother    Breast cancer Maternal Aunt 63 - 49   Hypertension Maternal Grandmother    Lupus Maternal  Grandmother    Heart failure Maternal Grandmother    Stroke Maternal Grandmother    CAD Maternal Grandmother    Adrenal disorder Neg Hx     Social History   Socioeconomic History   Marital status: Married    Spouse name: Not on file   Number of children: Not on file   Years of education: Not on file   Highest education level: GED or equivalent  Occupational History   Not on file  Tobacco Use   Smoking status: Every Day    Current packs/day: 1.00    Average packs/day: 1  pack/day for 32.7 years (32.7 ttl pk-yrs)    Types: Cigarettes    Start date: 01/26/1991   Smokeless tobacco: Never   Tobacco comments:    Patient is participating in health coaching for smoking cessation.   Vaping Use   Vaping status: Never Used  Substance and Sexual Activity   Alcohol use: No   Drug use: No   Sexual activity: Yes    Birth control/protection: None  Other Topics Concern   Not on file  Social History Narrative   Right handed      Highest level of edu- 12th grade      7 children      Apartment- 2nd floor   Social Drivers of Health   Financial Resource Strain: Medium Risk (10/09/2023)   Overall Financial Resource Strain (CARDIA)    Difficulty of Paying Living Expenses: Somewhat hard  Food Insecurity: Food Insecurity Present (10/09/2023)   Hunger Vital Sign    Worried About Running Out of Food in the Last Year: Sometimes true    Ran Out of Food in the Last Year: Never true  Transportation Needs: No Transportation Needs (10/09/2023)   PRAPARE - Administrator, Civil Service (Medical): No    Lack of Transportation (Non-Medical): No  Physical Activity: Inactive (10/09/2023)   Exercise Vital Sign    Days of Exercise per Week: 0 days    Minutes of Exercise per Session: Not on file  Stress: Stress Concern Present (10/09/2023)   [employer] of Occupational Health - Occupational Stress Questionnaire    Feeling of Stress: Very much  Social Connections: Moderately Isolated (10/09/2023)   Social Connection and Isolation Panel    Frequency of Communication with Friends and Family: Twice a week    Frequency of Social Gatherings with Friends and Family: Never    Attends Religious Services: 1 to 4 times per year    Active Member of [employer] or Organizations: No    Attends Engineer, structural: Not on file    Marital Status: Married  Catering manager Violence: Not At Risk (03/03/2023)   Humiliation, Afraid, Rape, and Kick questionnaire    Fear of  Current or Ex-Partner: No    Emotionally Abused: No    Physically Abused: No    Sexually Abused: No    Review of Systems  Constitutional:  Positive for fatigue.  Respiratory:  Positive for shortness of breath.   Psychiatric/Behavioral:  Positive for sleep disturbance.     Vitals:   10/13/23 0830  BP: (!) 171/104  Pulse: 92  SpO2: 97%     Physical Exam Constitutional:      Appearance: She is obese.  HENT:     Head: Normocephalic.     Mouth/Throat:     Mouth: Mucous membranes are moist.  Eyes:     General: No scleral icterus.    Pupils: Pupils are equal,  round, and reactive to light.  Cardiovascular:     Rate and Rhythm: Normal rate and regular rhythm.     Heart sounds: No murmur heard.    No friction rub.  Pulmonary:     Effort: No respiratory distress.     Breath sounds: No stridor. No wheezing or rhonchi.  Musculoskeletal:     Cervical back: No rigidity or tenderness.  Neurological:     Mental Status: She is alert.  Psychiatric:        Mood and Affect: Mood normal.    Data Reviewed: CT with 3 mm lung nodule, emphysema 05/22/2021-reviewed by myself-CT scan was reviewed with the patient during the visit  Most recent CT scan 05/21/2023 with evidence of emphysema-reviewed by myself  PFT reviewed with the patient showing mild obstructive disease, air trapping suggestive of emphysema with no significant bronchodilator response  Assessment:  Emphysema  Chronic obstructive pulmonary disease - Remains on Breztri   History of lupus, rheumatoid arthritis - Encouraged to follow-up with rheumatology  History of reflux  Uncontrolled hypertension - Has an appointment to follow-up with cardiology - Medications being optimized - Blood pressure 170/104 - On multiple medications   Plan/Recommendations:  Schedule for pulmonary function test to assess any progression of lung dysfunction  Encouraged to schedule an appointment to follow-up with  rheumatology  Refills for Breztri  and albuterol   Will fill out FMLA paperwork  Follow-up in about 3 months  Encouraged to call with significant concerns  Active smoker, smoking cessation counseling  Jennet Epley MD [facility] 10/13/2023, 8:46 AM  CC: Tanda Bleacher, MD

## 2023-10-14 ENCOUNTER — Other Ambulatory Visit (HOSPITAL_BASED_OUTPATIENT_CLINIC_OR_DEPARTMENT_OTHER): Payer: Self-pay

## 2023-10-14 ENCOUNTER — Ambulatory Visit: Payer: Self-pay | Admitting: Family Medicine

## 2023-10-17 ENCOUNTER — Other Ambulatory Visit (HOSPITAL_COMMUNITY): Payer: Self-pay

## 2023-10-17 ENCOUNTER — Encounter (HOSPITAL_BASED_OUTPATIENT_CLINIC_OR_DEPARTMENT_OTHER): Payer: Self-pay | Admitting: Cardiovascular Disease

## 2023-10-17 ENCOUNTER — Telehealth (HOSPITAL_BASED_OUTPATIENT_CLINIC_OR_DEPARTMENT_OTHER): Payer: Self-pay | Admitting: *Deleted

## 2023-10-17 ENCOUNTER — Ambulatory Visit (INDEPENDENT_AMBULATORY_CARE_PROVIDER_SITE_OTHER): Admitting: Cardiovascular Disease

## 2023-10-17 ENCOUNTER — Telehealth: Payer: Self-pay

## 2023-10-17 ENCOUNTER — Other Ambulatory Visit (HOSPITAL_BASED_OUTPATIENT_CLINIC_OR_DEPARTMENT_OTHER): Payer: Self-pay

## 2023-10-17 VITALS — BP 138/88 | HR 97 | Resp 17 | Ht 64.0 in | Wt 162.0 lb

## 2023-10-17 DIAGNOSIS — Z87891 Personal history of nicotine dependence: Secondary | ICD-10-CM

## 2023-10-17 DIAGNOSIS — E785 Hyperlipidemia, unspecified: Secondary | ICD-10-CM | POA: Diagnosis not present

## 2023-10-17 DIAGNOSIS — G4733 Obstructive sleep apnea (adult) (pediatric): Secondary | ICD-10-CM

## 2023-10-17 DIAGNOSIS — Z5181 Encounter for therapeutic drug level monitoring: Secondary | ICD-10-CM

## 2023-10-17 DIAGNOSIS — I1A0 Resistant hypertension: Secondary | ICD-10-CM

## 2023-10-17 MED ORDER — ATORVASTATIN CALCIUM 80 MG PO TABS: 80.0000 mg | ORAL_TABLET | Freq: Every day | ORAL | 1 refills | Status: DC

## 2023-10-17 MED ORDER — VITAMIN D (ERGOCALCIFEROL) 1.25 MG (50000 UNIT) PO CAPS: 50000.0000 [IU] | ORAL_CAPSULE | ORAL | 2 refills | Status: AC

## 2023-10-17 MED ORDER — PREGABALIN 200 MG PO CAPS: 200.0000 mg | ORAL_CAPSULE | Freq: Two times a day (BID) | ORAL | 3 refills | Status: AC

## 2023-10-17 NOTE — Telephone Encounter (Signed)
*  Pulm  Pharmacy Patient Advocate Encounter   Received notification from CoverMyMeds that prior authorization for Breztri  is required/requested.   Insurance verification completed.   The patient is insured through [employer]  .   Per test claim: The current 30 day co-pay is, $50.  No PA needed at this time. This test claim was processed through [facility]- copay amounts may vary at other pharmacies due to pharmacy/plan contracts, or as the patient moves through the different stages of their insurance plan.

## 2023-10-17 NOTE — Patient Instructions (Signed)
 Medication Instructions:  INCREASE ATORVASTATIN  TO 80 MG DAILY   REFILLED LYRICA  200 MG TWICE A DAY   Labwork: FASTING LP/CMET IN 2 MONTHS   Testing/Procedures: WILL NEED A CTA OF ABDOMEN IF YOU ARE APPROVED FOR THE RENAL DENERVATION PROCEDURE DISCUSSED.   24 HOUR BLOOD PRESSURE MACHINE   Follow-Up: 3 MONTHS WITH [name] W NP, DR RAFORD, OR PHARM D   Any Other Special Instructions Will Be Listed Below (If Applicable). WILL REACH OUT TO OUR RENAL DENERVATION TEAM TO SEE ABOUT GETTING YOU SCHEDULED   If you need a refill on your cardiac medications before your next appointment, please call your pharmacy.

## 2023-10-17 NOTE — Telephone Encounter (Signed)
 Patient seen today, Dr Raford would like for patient to do Renal Denervation if qualifies   Will forward to Olam SAUNDERS RN with Dr Darron
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
## 2023-10-17 NOTE — Progress Notes (Signed)
 H&V Care Navigation CSW Progress Note  Clinical Social Worker met with patient to f/u on referral for concerns related to re-establishing with rheumatology. We discussed that pt is not able to re-establish with [facility] provider because she has a past due balance of around $200. Pt working full time at [employer] still. She shares she is taking all of her medications as prescribed, uses her car as needed, and is up for an annual bonus in October. At this time when asked pt states she is up to date on her bills, only bill that was recently behind was her cable bill which her son was responsible for. She and her husband continue to keep separate finances and she manages all household costs. Currently income keeps her from any assistance programs. Unfortunately, pt has already utilized patient care fund previously and does not qualify at this time, we also are not able to use Patient Care Fund for copays/medical bills. We did discuss how possibly contacting a community group for assistance may be able to assist her with freeing up some funds to clear her balance. Provided her with information regarding rent/utility assistance locally. Pt is going to use her additional income from work to try and cover that balance.   We also discussed stressors at home, she was previously referred to a free stress management program through [facility] called [employer], which she ended up being screened as eligible for and then declined at her preference. We discussed even with good boundaries that having a neutral professional to help support her can be beneficial. Pt states she is open to a therapist but doesn't have resources previously provided. We will provide her those again today. Shared I would f/u with her over the next few weeks to see if any additional questions/concerns arise around resources provided.  Encouraged her to call us  as needed.   Patient is participating in a Managed Medicaid Plan:  No, Engineer, building services  only  SDOH Screenings   Food Insecurity: Food Insecurity Present (10/17/2023)  Housing: High Risk (10/17/2023)  Transportation Needs: No Transportation Needs (10/17/2023)  Utilities: Not At Risk (10/17/2023)  Alcohol Screen: Low Risk  (10/09/2023)  Depression (PHQ2-9): High Risk (03/03/2023)  Financial Resource Strain: Medium Risk (10/17/2023)  Physical Activity: Inactive (10/09/2023)  Social Connections: Moderately Isolated (10/09/2023)  Stress: Stress Concern Present (10/17/2023)  Tobacco Use: High Risk (10/17/2023)  Health Literacy: Adequate Health Literacy (10/17/2023)    Marit Lark, MSW, LCSW Clinical Social Worker II [facility] Heart/Vascular Care Navigation  [phone]- work cell phone (preferred)

## 2023-10-18 ENCOUNTER — Ambulatory Visit (HOSPITAL_COMMUNITY)
Admission: EM | Admit: 2023-10-18 | Discharge: 2023-10-18 | Disposition: A | Discharge status: Home / Self Care | Attending: Emergency Medicine | Admitting: Emergency Medicine

## 2023-10-18 ENCOUNTER — Encounter (HOSPITAL_COMMUNITY): Payer: Self-pay | Admitting: *Deleted

## 2023-10-18 ENCOUNTER — Ambulatory Visit (INDEPENDENT_AMBULATORY_CARE_PROVIDER_SITE_OTHER)

## 2023-10-18 DIAGNOSIS — M25561 Pain in right knee: Secondary | ICD-10-CM

## 2023-10-18 DIAGNOSIS — F17200 Nicotine dependence, unspecified, uncomplicated: Secondary | ICD-10-CM | POA: Diagnosis not present

## 2023-10-18 DIAGNOSIS — I10 Essential (primary) hypertension: Secondary | ICD-10-CM | POA: Diagnosis not present

## 2023-10-18 NOTE — Discharge Instructions (Signed)
 Take home meds as prescribed. Xray is negative for fracture, do not skip meals.  Rest,ice,elevate wear ace wrap for support. May take OTC tylenol  as label directed for pain. Follow up with your Orthopedist-call for appt next week. Stop smoking, avoid salt
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
## 2023-10-18 NOTE — ED Provider Notes (Signed)
 [facility]    CSN: [id number] Arrival date & time: 10/18/23  1050      History   Chief Complaint Chief Complaint  Patient presents with   Loss of Consciousness   Knee Pain    HPI Denise Carter is a 53 y.o. female.   53 year old female, Denise Carter, presents to urgent care for right knee pain after falling at gas station yesterday 9/22 at 0430 amhit the floor after getting up and out of car to pump gas, pt denies head strike. Pt was seen at cardiologist yesterday and evaluated after fall, cardiologist note reports syncopal episode due to taking hypertenisve meds and not eating. Pt states she skips meals frequently. Pt has swelling to right knee, full ROM. GCS 15  Pt has not taken her BP meds this am.   PMH: smokes 1ppd of cigarettes, no drugs or alcohol, GERD  The history is provided by the patient. No language interpreter was used.    Past Medical History:  Diagnosis Date   Atypical chest pain 05/23/2019   Depression 06/28/2023   Essential hypertension 05/23/2019   Hypertension    Lupus    OSA (obstructive sleep apnea) 11/19/2019   Resistant hypertension 05/23/2019   Shortness of breath 05/23/2019   Snoring 05/23/2019   Thyroid  disease    Varicose veins of left lower extremity 01/26/2020    Patient Active Problem List   Diagnosis Date Noted   Acute pain of right knee 10/18/2023   Smoker 10/18/2023   Depression 06/28/2023   Chronic fatigue syndrome 02/06/2021   Overweight 02/06/2021   Polyarticular psoriatic arthritis (HCC) 02/06/2021   RLS (restless legs syndrome) 02/06/2021   Sjogren syndrome, unspecified 02/06/2021   Rheumatoid arthritis (HCC) 02/06/2021   Systemic lupus erythematosus (HCC) 02/06/2021   Other chronic pain 02/04/2020   Obesity 02/04/2020   OSA (obstructive sleep apnea) 11/19/2019   Thyroid  nodule 07/23/2019   Atypical chest pain 05/23/2019   Uncontrolled hypertension 05/23/2019   Snoring 05/23/2019   Rheumatoid  arthritis without rheumatoid factor, multiple sites (HCC) 06/22/2018   Polyarthralgia 06/22/2018   Sjogren's disease 06/22/2018   Polyclonal gammopathy determined by serum protein electrophoresis 01/30/2017   Polyneuropathy due to systemic lupus erythematosus (HCC) 04/09/2016   Lumbar radiculopathy 11/27/2015   Neuroma digital nerve 04/03/2015   Plantar fasciitis 04/03/2015   Preop pulmonary/respiratory exam 11/28/2014   Smoking hx 11/28/2014   Dysmenorrhea 11/11/2014   Family hx of ovarian malignancy 11/11/2014    Past Surgical History:  Procedure Laterality Date   ABDOMINAL HYSTERECTOMY      OB History   No obstetric history on file.      Home Medications    Prior to Admission medications   Medication Sig Start Date End Date Taking? Authorizing Provider  albuterol  (VENTOLIN  HFA) 108 (90 Base) MCG/ACT inhaler Inhale 2 puffs into the lungs every 6 (six) hours as needed for wheezing or shortness of breath. 10/13/23  Yes Olalere, Jennet A, MD  aspirin  (EC-81 ASPIRIN ) 81 MG EC tablet Take 1 tablet (81 mg total) by mouth daily. Swallow whole. 12/06/19  Yes Prentiss Dorothyann Maxwell, MD  atorvastatin  (LIPITOR) 80 MG tablet Take 1 tablet (80 mg total) by mouth daily. 10/17/23  Yes Raford Riggs, MD  budesonide-glycopyrrolate-formoterol (BREZTRI  AEROSPHERE) 160-9-4.8 MCG/ACT AERO inhaler Inhale 2 puffs into the lungs every 12 (twelve) hours. 10/13/23  Yes Olalere, Adewale A, MD  carvedilol  (COREG ) 25 MG tablet Take 1 tablet (25 mg total) by mouth 2 (two) times  daily. 08/12/23 11/10/23 Yes Vannie Reche RAMAN, NP  cloNIDine  (CATAPRES  - DOSED IN MG/24 HR) 0.2 mg/24hr patch Place 0.2 mg onto the skin once a week.   Yes [provider]  fluticasone  (FLONASE ) 50 MCG/ACT nasal spray Place 2 sprays into both nostrils daily. 02/13/21  Yes Tanda Bleacher, MD  hydrALAZINE  (APRESOLINE ) 100 MG tablet Take 1 tablet (100 mg total) by mouth 3 (three) times daily. 08/12/23 11/10/23 Yes Walker,  [name] S, NP  ibuprofen (ADVIL) 200 MG tablet Take 200 mg by mouth every 6 (six) hours as needed.   Yes [provider]  KLS OMPERAZOLE 20 MG TBEC Take 1 tablet by mouth daily. 12/02/21  Yes [provider]  mirtazapine  (REMERON ) 7.5 MG tablet Take 1 tablet (7.5 mg total) by mouth at bedtime. 03/03/23  Yes Tanda Bleacher, MD  omeprazole  (PRILOSEC OTC) 20 MG tablet Take 20 mg by mouth daily.   Yes [provider]  pantoprazole  (PROTONIX ) 40 MG tablet Take 40 mg by mouth daily. 02/23/17  Yes [provider]  pregabalin  (LYRICA ) 200 MG capsule Take 1 capsule (200 mg total) by mouth 2 (two) times daily. 10/17/23  Yes Raford Riggs, MD  Vitamin D , Ergocalciferol , (DRISDOL ) 1.25 MG (50000 UNIT) CAPS capsule Take 1 capsule (50,000 Units total) by mouth every 7 (seven) days. 10/17/23  Yes Vannie Reche RAMAN, NP    Family History Family History  Problem Relation Age of Onset   Hypertension Mother    Hypertension Sister    Hypertension Brother    Hypertension Brother    Hypertension Maternal Aunt    Breast cancer Maternal Aunt 62 - 49   Hypertension Maternal Grandmother    Lupus Maternal Grandmother    Heart failure Maternal Grandmother    Stroke Maternal Grandmother    CAD Maternal Grandmother    Adrenal disorder Neg Hx     Social History Social History   Tobacco Use   Smoking status: Every Day    Current packs/day: 1.00    Average packs/day: 1 pack/day for 32.7 years (32.7 ttl pk-yrs)    Types: Cigarettes    Start date: 01/26/1991   Smokeless tobacco: Never   Tobacco comments:    Patient is participating in health coaching for smoking cessation.   Vaping Use   Vaping status: Never Used  Substance Use Topics   Alcohol use: No   Drug use: No     Allergies   Labetalol    Review of Systems Review of Systems  Respiratory:  Negative for chest tightness.   Cardiovascular:  Negative for chest pain, palpitations and leg swelling.  Musculoskeletal:   Positive for arthralgias, gait problem and joint swelling.  Skin: Negative.   Neurological:  Positive for syncope.  All other systems reviewed and are negative.    Physical Exam Triage Vital Signs ED Triage Vitals  Encounter Vitals Group     BP 10/18/23 1104 (!) 154/112     Girls Systolic BP Percentile --      Girls Diastolic BP Percentile --      Boys Systolic BP Percentile --      Boys Diastolic BP Percentile --      Pulse Rate 10/18/23 1104 96     Resp 10/18/23 1104 16     Temp 10/18/23 1104 98.6 F (37 C)     Temp Source 10/18/23 1104 Oral     SpO2 10/18/23 1104 97 %     Weight --      Height --  Head Circumference --      Peak Flow --      Pain Score 10/18/23 1102 7     Pain Loc --      Pain Education --      Exclude from Growth Chart --    No data found.  Updated Vital Signs BP (!) 154/112 (BP Location: Right Arm) Comment: she has not taken her meds this morning.  Pulse 96   Temp 98.6 F (37 C) (Oral)   Resp 16   SpO2 97%   Visual Acuity Right Eye Distance:   Left Eye Distance:   Bilateral Distance:    Right Eye Near:   Left Eye Near:    Bilateral Near:     Physical Exam Vitals and nursing note reviewed.  Constitutional:      Appearance: She is well-developed and well-groomed.  HENT:     Head: Normocephalic.  Cardiovascular:     Rate and Rhythm: Normal rate.     Pulses:          Dorsalis pedis pulses are 2+ on the right side.  Pulmonary:     Effort: Pulmonary effort is normal.  Neurological:     General: No focal deficit present.     Mental Status: She is alert and oriented to person, place, and time.     GCS: GCS eye subscore is 4. GCS verbal subscore is 5. GCS motor subscore is 6.  Psychiatric:        Attention and Perception: Attention normal.        Mood and Affect: Mood normal.        Speech: Speech normal.        Behavior: Behavior normal. Behavior is cooperative.      UC Treatments / Results  Labs (all labs ordered are  listed, but only abnormal results are displayed) Labs Reviewed - No data to display  EKG   Radiology DG Knee Complete 4 Views Right Result Date: 10/18/2023 CLINICAL DATA:  Knee pain after falling. EXAM: RIGHT KNEE - COMPLETE 4+ VIEW COMPARISON:  None Available. FINDINGS: The mineralization and alignment are normal. There is no evidence of acute fracture or dislocation. The joint spaces are preserved. Possible mild prepatellar soft tissue swelling without evidence of foreign body, soft tissue emphysema or significant joint effusion. IMPRESSION: No evidence of acute fracture or dislocation. Possible mild prepatellar soft tissue swelling. Electronically Signed   By: Elsie Perone M.D.   On: 10/18/2023 11:56    Procedures Procedures (including critical care time)  Medications Ordered in UC Medications - No data to display  Initial Impression / Assessment and Plan / UC Course  I have reviewed the triage vital signs and the nursing notes.  Pertinent labs & imaging results that were available during my care of the patient were reviewed by me and considered in my medical decision making (see chart for details).    Discussed exam findings and plan of care with patient, xray is negative for fracture, ace wrap applied, referred to Ortho-call for appt, strict go to ER precautions given.   Patient verbalized understanding to this provider.  Ddx: Right knee pain, sprain,strain,fracture,dislocation, arthralgia, smoker, uncontrolled hypertension Final Clinical Impressions(s) / UC Diagnoses   Final diagnoses:  Acute pain of right knee  Uncontrolled hypertension  Smoker     Discharge Instructions      Take home meds as prescribed. Xray is negative for fracture, do not skip meals.  Rest,ice,elevate wear ace wrap for support. May  take OTC tylenol  as label directed for pain. Follow up with your Orthopedist-call for appt next week. Stop smoking, avoid salt     ED Prescriptions   None     PDMP not reviewed this encounter.   Aminta Loose, NP 10/18/23 2042

## 2023-10-21 NOTE — Telephone Encounter (Signed)
 Message has been sent to the precert pool to try for procedure approval.

## 2023-10-27 ENCOUNTER — Telehealth: Payer: Self-pay | Admitting: Cardiovascular Disease

## 2023-10-27 ENCOUNTER — Telehealth (HOSPITAL_BASED_OUTPATIENT_CLINIC_OR_DEPARTMENT_OTHER): Payer: Self-pay | Admitting: Licensed Clinical Social Worker

## 2023-10-27 NOTE — Telephone Encounter (Addendum)
 H&V Care Navigation CSW Progress Note  Clinical Social Worker contacted patient by phone to f/u on concerns voiced during previous appt at [facility]. Was able to reach her today at [phone]. Pt shares she fell and has had knee pain, she is hopeful to get answers tomorrow during ortho appt. Shares she did receive her bonus check at work, but was $2000 behind on rent (pt had reported to this Clinical research associate she was not behind on any bills at last appointment), and therefore didn't have money to make payment to rheumatology. LCSW completed chart review, I inquired if she owed money at all rheumatology clinics or if it also may have been related to no shows, as I note no appointment notes in chart only that pt was referred to rheumatology at [facility] and denied as she was out of service area and would owe payment up front. It appears she was referred to rheumatology in 2023 at [facility] and declined a referral there. She was also declined (no reason stated) at [facility] in 2023. She shares she had multiple no shows/cancellations at an office but cannot tell us  where.   Encouraged her to call [facility] to see if they are accepting new referrals, we would be happy to send that referral. Pt agreeable to reach out.   Patient is participating in a Managed Medicaid Plan:  No  SDOH Screenings   Food Insecurity: Food Insecurity Present (10/17/2023)  Housing: High Risk (10/17/2023)  Transportation Needs: No Transportation Needs (10/17/2023)  Utilities: Not At Risk (10/17/2023)  Alcohol Screen: Low Risk  (10/09/2023)  Depression (PHQ2-9): High Risk (03/03/2023)  Financial Resource Strain: Medium Risk (10/17/2023)  Physical Activity: Inactive (10/09/2023)  Social Connections: Moderately Isolated (10/09/2023)  Stress: Stress Concern Present (10/17/2023)  Tobacco Use: High Risk (10/18/2023)  Health Literacy: Adequate Health Literacy (10/17/2023)    Marit Lark, MSW, LCSW Clinical Social  Worker II [facility] Heart/Vascular Care Navigation  [phone]- work cell phone (preferred)

## 2023-10-27 NOTE — Telephone Encounter (Signed)
 Patient is calling to see if forms have been completed. Please advise

## 2023-10-28 ENCOUNTER — Ambulatory Visit: Admitting: Family

## 2023-10-28 ENCOUNTER — Encounter: Payer: Self-pay | Admitting: Family

## 2023-10-28 DIAGNOSIS — M25561 Pain in right knee: Secondary | ICD-10-CM | POA: Diagnosis not present

## 2023-10-28 MED ORDER — METHYLPREDNISOLONE ACETATE 40 MG/ML IJ SUSP
40.0000 mg | INTRAMUSCULAR | Status: AC | PRN
  Administered 2023-10-28: 40 mg via INTRA_ARTICULAR

## 2023-10-28 MED ORDER — LIDOCAINE HCL 1 % IJ SOLN
5.0000 mL | INTRAMUSCULAR | Status: AC | PRN
  Administered 2023-10-28: 5 mL

## 2023-10-28 NOTE — Progress Notes (Signed)
 Office Visit Note   Patient: Denise Carter           Date of Birth: 01/26/1970           MRN: [id number] Visit Date: 10/28/2023              Requested by: Tanda Bleacher, MD [address] [place],  [place] [address] PCP: Tanda Bleacher, MD  Chief Complaint  Patient presents with   Right Knee - Pain      HPI: The patient is a 53 year old woman who presents today for knee pain after an injury she unfortunately had taken too much of her blood pressure medication and passed out on Friday does not recall the mechanism of her injury to her knee she has had swelling and pain since.  Pain with full extension medial sided knee pain associated with swelling  She has tried ice Voltaren gel and Biofreeze without relief  Assessment & Plan: Visit Diagnoses: No diagnosis found.  Plan: Concern for possible meniscal injury will provide Depo-Medrol  injection today she will follow-up in the office in 3 to 4 weeks if she fails to improve  Follow-Up Instructions: No follow-ups on file.   Right Knee Exam   Muscle Strength  The patient has normal right knee strength.  Tenderness  The patient is experiencing tenderness in the medial joint line.  Range of Motion  Extension:  -10  Flexion:  normal   Tests  Varus: negative Valgus: negative  Other  Erythema: absent Swelling: mild Effusion: effusion present      Patient is alert, oriented, no adenopathy, well-dressed, normal affect, normal respiratory effort.     Imaging: No results found. No images are attached to the encounter.  Labs: Lab Results  Component Value Date   HGBA1C 5.0 10/12/2023   HGBA1C 4.8 09/06/2019   HGBA1C 5.0 07/19/2018   REPTSTATUS 09/27/2020 FINAL 09/24/2020   GRAMSTAIN  05/17/2007    MODERATE WBC PRESENT, PREDOMINANTLY PMN NO SQUAMOUS EPITHELIAL CELLS SEEN FEW GRAM NEGATIVE RODS FEW GRAM POSITIVE COCCI IN PAIRS   CULT >=100,000 COLONIES/mL ESCHERICHIA COLI (A) 09/24/2020    LABORGA ESCHERICHIA COLI (A) 09/24/2020     Lab Results  Component Value Date   ALBUMIN 4.1 10/12/2023   ALBUMIN 4.1 09/16/2022   ALBUMIN 4.1 02/06/2021    No results found for: MG Lab Results  Component Value Date   VD25OH 53.8 10/12/2023   VD25OH 25.1 (L) 08/15/2023   VD25OH CANCELED 08/12/2023    No results found for: PREALBUMIN    Latest Ref Rng & Units 10/12/2023   10:08 AM 07/30/2023    1:58 PM 07/27/2023    4:18 PM  CBC EXTENDED  WBC 3.4 - 10.8 x10E3/uL 7.6  8.9  9.1   RBC 3.77 - 5.28 x10E6/uL 4.87  5.04  5.19   Hemoglobin 11.1 - 15.9 g/dL 86.4  85.4  85.5   HCT 34.0 - 46.6 % 44.2  43.6  45.2   Platelets 150 - 450 x10E3/uL 195  192  208   NEUT# 1.4 - 7.0 x10E3/uL 4.9   6.4   Lymph# 0.7 - 3.1 x10E3/uL 1.9   2.0      There is no height or weight on file to calculate BMI.  Orders:  No orders of the defined types were placed in this encounter.  No orders of the defined types were placed in this encounter.    Procedures: Large Joint Inj: R knee on 10/28/2023 10:50 AM Indications: pain  Details: 18 G 1.5 in needle, anteromedial approach Medications: 5 mL lidocaine  1 %; 40 mg methylPREDNISolone  acetate 40 MG/ML Consent was given by the patient.      Clinical Data: No additional findings.  ROS:  All other systems negative, except as noted in the HPI. Review of Systems  Objective: Vital Signs: There were no vitals taken for this visit.  Specialty Comments:  No specialty comments available.  PMFS History: Patient Active Problem List   Diagnosis Date Noted   Acute pain of right knee 10/18/2023   Smoker 10/18/2023   Depression 06/28/2023   Chronic fatigue syndrome 02/06/2021   Overweight 02/06/2021   Polyarticular psoriatic arthritis (HCC) 02/06/2021   RLS (restless legs syndrome) 02/06/2021   Sjogren syndrome, unspecified 02/06/2021   Rheumatoid arthritis (HCC) 02/06/2021   Systemic lupus erythematosus (HCC) 02/06/2021   Other chronic pain  02/04/2020   Obesity 02/04/2020   OSA (obstructive sleep apnea) 11/19/2019   Thyroid  nodule 07/23/2019   Atypical chest pain 05/23/2019   Uncontrolled hypertension 05/23/2019   Snoring 05/23/2019   Rheumatoid arthritis without rheumatoid factor, multiple sites (HCC) 06/22/2018   Polyarthralgia 06/22/2018   Sjogren's disease 06/22/2018   Polyclonal gammopathy determined by serum protein electrophoresis 01/30/2017   Polyneuropathy due to systemic lupus erythematosus (HCC) 04/09/2016   Lumbar radiculopathy 11/27/2015   Neuroma digital nerve 04/03/2015   Plantar fasciitis 04/03/2015   Preop pulmonary/respiratory exam 11/28/2014   Smoking hx 11/28/2014   Dysmenorrhea 11/11/2014   Family hx of ovarian malignancy 11/11/2014   Past Medical History:  Diagnosis Date   Atypical chest pain 05/23/2019   Depression 06/28/2023   Essential hypertension 05/23/2019   Hypertension    Lupus    OSA (obstructive sleep apnea) 11/19/2019   Resistant hypertension 05/23/2019   Shortness of breath 05/23/2019   Snoring 05/23/2019   Thyroid  disease    Varicose veins of left lower extremity 01/26/2020    Family History  Problem Relation Age of Onset   Hypertension Mother    Hypertension Sister    Hypertension Brother    Hypertension Brother    Hypertension Maternal Aunt    Breast cancer Maternal Aunt 40 - 49   Hypertension Maternal Grandmother    Lupus Maternal Grandmother    Heart failure Maternal Grandmother    Stroke Maternal Grandmother    CAD Maternal Grandmother    Adrenal disorder Neg Hx     Past Surgical History:  Procedure Laterality Date   ABDOMINAL HYSTERECTOMY     Social History   Occupational History   Not on file  Tobacco Use   Smoking status: Every Day    Current packs/day: 1.00    Average packs/day: 1 pack/day for 32.8 years (32.8 ttl pk-yrs)    Types: Cigarettes    Start date: 01/26/1991   Smokeless tobacco: Never   Tobacco comments:    Patient is participating in  health coaching for smoking cessation.   Vaping Use   Vaping status: Never Used  Substance and Sexual Activity   Alcohol use: No   Drug use: No   Sexual activity: Yes    Birth control/protection: None

## 2023-10-28 NOTE — Telephone Encounter (Signed)
 Mychart message sent to patient.

## 2023-10-31 DIAGNOSIS — M25561 Pain in right knee: Secondary | ICD-10-CM

## 2023-11-01 NOTE — Telephone Encounter (Signed)
 Advised patient forms completed  Faxed, confirmation received, and mailed copy to patient as requested

## 2023-11-03 ENCOUNTER — Telehealth (HOSPITAL_BASED_OUTPATIENT_CLINIC_OR_DEPARTMENT_OTHER): Payer: Self-pay | Admitting: *Deleted

## 2023-11-03 ENCOUNTER — Telehealth (HOSPITAL_BASED_OUTPATIENT_CLINIC_OR_DEPARTMENT_OTHER): Payer: Self-pay | Admitting: Licensed Clinical Social Worker

## 2023-11-03 NOTE — Telephone Encounter (Signed)
 H&V Care Navigation CSW Progress Note  Clinical Social Worker contacted patient by phone to provide check in. Reached her today at [phone]. She shares she had a staycation and took some days off. She plans to return to work, waiting on an MRI as her knee still hurts from fall even after seeing orthopedics team.   Inquired if pt had contacted [facility] to see if they could accept her as a patient. LCSW again competed further chart review for better understanding of options given confusion of reports from pt of why she can't find a provider. I now think I have a better understanding of referrals all made in 2023. It appears that [facility] Rheumatology practice had declined referral, [facility] provider had declined due to either money owed/no shows, [facility] Rhe[facility] declined referral bc she had been dismissed for multiple no shows. She was last referred to [facility] per  notes and was scheduled for appt on 03/23/22, unclear if attended that appt. Perhaps [facility]ology may be able to offer appt.  She states she did and they were going to have practice admin call her back but they havent yet. She will call again and let us  know so we can send referral.   She is upset b/c she went to GI provider [facility]) yesterday for a visit and per her report he will not schedule her for endoscopy/colonoscopy due to high blood pressure. She states she needs to have these done b/c of polyps and GERD. I shared that unfortunately I cannot provide guidance as to why these recommendations for delay are being made. Will route to RN to ensure what pt concerns are.    Patient is participating in a Managed Medicaid Plan:  No, Engineer, building services  SDOH Screenings   Food Insecurity: Food Insecurity Present (10/17/2023)  Housing: High Risk (10/17/2023)  Transportation Needs: No Transportation Needs (10/17/2023)  Utilities: Not At Risk (10/17/2023)  Alcohol Screen: Low Risk   (10/09/2023)  Depression (PHQ2-9): High Risk (03/03/2023)  Financial Resource Strain: Medium Risk (10/17/2023)  Physical Activity: Inactive (10/09/2023)  Social Connections: Moderately Isolated (10/09/2023)  Stress: Stress Concern Present (10/17/2023)  Tobacco Use: High Risk (10/28/2023)  Health Literacy: Adequate Health Literacy (10/17/2023)    Marit Lark, MSW, LCSW Clinical Social Worker II [facility] Heart/Vascular Care Navigation  [phone]- work cell phone (preferred)

## 2023-11-03 NOTE — Telephone Encounter (Signed)
 Pt states her endoscopy and colonoscopy were delayed into the new year b/c of her blood pressure. She states she needs to have these done due to concerns with GERD and polyps and wonders why she  can't have it b/c her BP has been better. Said I'd pass it on to yall to review.    I have also asked her to f/u with [facility] Rheumatology as it may be the last practice that may take a referral. It appears that [facility] Rhe[facility] practice had declined referral, [facility] provider had declined due to either money owed/no shows (?), [facility] Rheumatology declined referral bc she had been dismissed for multiple no shows. She was last referred to  [facility] per notes and was scheduled for appt on 03/23/22, unclear if attended that appt. Perhaps [facility] Rheumatology may be able to offer appt.    Thank you! Sorry for the long message.   Spoke with patient, said blood pressure running up and down, 130's-150's Wanted to let Dr Raford know that Dr Dianna pushed out procedure until after first of year  secondary to blood pressure being elevated  Offered patient sooner visit than December, declined   Will forward to Dr Raford as FYI

## 2023-11-04 ENCOUNTER — Encounter: Payer: Self-pay | Admitting: Family

## 2023-11-07 ENCOUNTER — Ambulatory Visit
Admit: 2023-11-07 | Discharge: 2023-11-07 | Disposition: A | Discharge status: Home / Self Care | Attending: Family Medicine | Admitting: Family Medicine

## 2023-11-07 ENCOUNTER — Ambulatory Visit

## 2023-11-07 DIAGNOSIS — Z1239 Encounter for other screening for malignant neoplasm of breast: Secondary | ICD-10-CM

## 2023-11-08 ENCOUNTER — Ambulatory Visit: Payer: Self-pay | Admitting: Family Medicine

## 2023-11-13 ENCOUNTER — Other Ambulatory Visit

## 2023-11-14 ENCOUNTER — Telehealth: Payer: Self-pay | Admitting: Family

## 2023-11-14 NOTE — Telephone Encounter (Signed)
 Pt called stating that her MRI needs to be sent to [facility] instead of [facility] due to financial reasons. Pt call back number is [phone]

## 2023-11-15 ENCOUNTER — Other Ambulatory Visit: Payer: Self-pay | Admitting: Family

## 2023-11-15 DIAGNOSIS — M25561 Pain in right knee: Secondary | ICD-10-CM

## 2023-11-16 ENCOUNTER — Other Ambulatory Visit

## 2023-11-16 ENCOUNTER — Other Ambulatory Visit: Payer: Self-pay | Admitting: Family Medicine

## 2023-11-18 ENCOUNTER — Ambulatory Visit (HOSPITAL_BASED_OUTPATIENT_CLINIC_OR_DEPARTMENT_OTHER)
Admission: RE | Admit: 2023-11-18 | Discharge: 2023-11-18 | Disposition: A | Discharge status: Home / Self Care | Source: Ambulatory Visit | Attending: Family | Admitting: Family

## 2023-11-18 DIAGNOSIS — M25561 Pain in right knee: Secondary | ICD-10-CM | POA: Diagnosis present

## 2023-11-23 ENCOUNTER — Ambulatory Visit (INDEPENDENT_AMBULATORY_CARE_PROVIDER_SITE_OTHER): Admitting: Family

## 2023-11-23 ENCOUNTER — Encounter: Payer: Self-pay | Admitting: Family

## 2023-11-23 DIAGNOSIS — S83411D Sprain of medial collateral ligament of right knee, subsequent encounter: Secondary | ICD-10-CM

## 2023-11-23 MED ORDER — NAPROXEN 500 MG PO TABS: 500.0000 mg | ORAL_TABLET | Freq: Two times a day (BID) | ORAL | 1 refills | Status: DC | PRN

## 2023-11-23 NOTE — Progress Notes (Signed)
 Office Visit Note   Patient: Denise Carter           Date of Birth: 11/04/1970           MRN: [id number] Visit Date: 11/23/2023              Requested by: Tanda Bleacher, MD [address] [place],  [place] [address] PCP: Tanda Bleacher, MD  Chief Complaint  Patient presents with   Right Knee - Follow-up      HPI: The patient is a 53 year old woman who is seen in follow-up after MRI of the right knee  Initially injured her knee about a month ago after passing out she is unsure of the mechanism of injury but had swelling and pain when she came to she has pain with full extension significant medial knee pain this gradually worsens over the course of the day  She has been using Biofreeze and Tylenol   Assessment & Plan: Visit Diagnoses: No diagnosis found.  Plan: Given a hinged knee brace she will wear this for the next 6 to 8 weeks she will follow-up if she fails to improve will consider physical therapy in the next 8 weeks  Follow-Up Instructions: No follow-ups on file.   Right Knee Exam   Tenderness  The patient is experiencing tenderness in the medial joint line.  Range of Motion  The patient has normal right knee ROM.  Tests  Varus: negative Valgus: negative  Other  Erythema: absent Swelling: mild      Patient is alert, oriented, no adenopathy, well-dressed, normal affect, normal respiratory effort.     Imaging: No results found. No images are attached to the encounter.  Labs: Lab Results  Component Value Date   HGBA1C 5.0 10/12/2023   HGBA1C 4.8 09/06/2019   HGBA1C 5.0 07/19/2018   REPTSTATUS 09/27/2020 FINAL 09/24/2020   GRAMSTAIN  05/17/2007    MODERATE WBC PRESENT, PREDOMINANTLY PMN NO SQUAMOUS EPITHELIAL CELLS SEEN FEW GRAM NEGATIVE RODS FEW GRAM POSITIVE COCCI IN PAIRS   CULT >=100,000 COLONIES/mL ESCHERICHIA COLI (A) 09/24/2020   LABORGA ESCHERICHIA COLI (A) 09/24/2020     Lab Results  Component Value Date    ALBUMIN 4.1 10/12/2023   ALBUMIN 4.1 09/16/2022   ALBUMIN 4.1 02/06/2021    No results found for: MG Lab Results  Component Value Date   VD25OH 53.8 10/12/2023   VD25OH 25.1 (L) 08/15/2023   VD25OH CANCELED 08/12/2023    No results found for: PREALBUMIN    Latest Ref Rng & Units 10/12/2023   10:08 AM 07/30/2023    1:58 PM 07/27/2023    4:18 PM  CBC EXTENDED  WBC 3.4 - 10.8 x10E3/uL 7.6  8.9  9.1   RBC 3.77 - 5.28 x10E6/uL 4.87  5.04  5.19   Hemoglobin 11.1 - 15.9 g/dL 86.4  85.4  85.5   HCT 34.0 - 46.6 % 44.2  43.6  45.2   Platelets 150 - 450 x10E3/uL 195  192  208   NEUT# 1.4 - 7.0 x10E3/uL 4.9   6.4   Lymph# 0.7 - 3.1 x10E3/uL 1.9   2.0      There is no height or weight on file to calculate BMI.  Orders:  No orders of the defined types were placed in this encounter.  No orders of the defined types were placed in this encounter.    Procedures: No procedures performed  Clinical Data: No additional findings.  ROS:  All other systems negative, except as  noted in the HPI. Review of Systems  Objective: Vital Signs: There were no vitals taken for this visit.  Specialty Comments:  No specialty comments available.  PMFS History: Patient Active Problem List   Diagnosis Date Noted   Acute pain of right knee 10/18/2023   Smoker 10/18/2023   Depression 06/28/2023   Chronic fatigue syndrome 02/06/2021   Overweight 02/06/2021   Polyarticular psoriatic arthritis (HCC) 02/06/2021   RLS (restless legs syndrome) 02/06/2021   Sjogren syndrome, unspecified 02/06/2021   Rheumatoid arthritis (HCC) 02/06/2021   Systemic lupus erythematosus (HCC) 02/06/2021   Other chronic pain 02/04/2020   Obesity 02/04/2020   OSA (obstructive sleep apnea) 11/19/2019   Thyroid  nodule 07/23/2019   Atypical chest pain 05/23/2019   Uncontrolled hypertension 05/23/2019   Snoring 05/23/2019   Rheumatoid arthritis without rheumatoid factor, multiple sites (HCC) 06/22/2018    Polyarthralgia 06/22/2018   Sjogren's disease 06/22/2018   Polyclonal gammopathy determined by serum protein electrophoresis 01/30/2017   Polyneuropathy due to systemic lupus erythematosus (HCC) 04/09/2016   Lumbar radiculopathy 11/27/2015   Neuroma digital nerve 04/03/2015   Plantar fasciitis 04/03/2015   Preop pulmonary/respiratory exam 11/28/2014   Smoking hx 11/28/2014   Dysmenorrhea 11/11/2014   Family hx of ovarian malignancy 11/11/2014   Past Medical History:  Diagnosis Date   Atypical chest pain 05/23/2019   Depression 06/28/2023   Essential hypertension 05/23/2019   Hypertension    Lupus    OSA (obstructive sleep apnea) 11/19/2019   Resistant hypertension 05/23/2019   Shortness of breath 05/23/2019   Snoring 05/23/2019   Thyroid  disease    Varicose veins of left lower extremity 01/26/2020    Family History  Problem Relation Age of Onset   Hypertension Mother    Hypertension Sister    Hypertension Brother    Hypertension Brother    Hypertension Maternal Aunt    Breast cancer Maternal Aunt 40 - 49   Hypertension Maternal Grandmother    Lupus Maternal Grandmother    Heart failure Maternal Grandmother    Stroke Maternal Grandmother    CAD Maternal Grandmother    Adrenal disorder Neg Hx     Past Surgical History:  Procedure Laterality Date   ABDOMINAL HYSTERECTOMY     Social History   Occupational History   Not on file  Tobacco Use   Smoking status: Every Day    Current packs/day: 1.00    Average packs/day: 1 pack/day for 32.8 years (32.8 ttl pk-yrs)    Types: Cigarettes    Start date: 01/26/1991   Smokeless tobacco: Never   Tobacco comments:    Patient is participating in health coaching for smoking cessation.   Vaping Use   Vaping status: Never Used  Substance and Sexual Activity   Alcohol use: No   Drug use: No   Sexual activity: Yes    Birth control/protection: None

## 2023-11-28 ENCOUNTER — Encounter: Payer: Self-pay | Admitting: Radiology

## 2023-12-08 ENCOUNTER — Telehealth (HOSPITAL_BASED_OUTPATIENT_CLINIC_OR_DEPARTMENT_OTHER): Payer: Self-pay | Admitting: Licensed Clinical Social Worker

## 2023-12-08 NOTE — Telephone Encounter (Signed)
 H&V Care Navigation CSW Progress Note  Clinical Social Worker contacted patient by phone to provide check in and offer any additional assistance as needed. Left voicemail at [phone]. This was returned by pt. She shares she has had several ortho appts and received a brace but after being on her feet throughout the day it still hurts. Pt confirms she scheduled an appt in December to see Rosaline Salt, PA-C at [facility]. Encouraged her to let us  know if they need any f/u. No additional questions, encouraged her to call as needed, we will see her in December.  Patient is participating in a Managed Medicaid Plan:  no, Engineer, building services only  SDOH Screenings   Food Insecurity: Food Insecurity Present (10/17/2023)  Housing: High Risk (10/17/2023)  Transportation Needs: No Transportation Needs (10/17/2023)  Utilities: Not At Risk (10/17/2023)  Alcohol Screen: Low Risk  (10/09/2023)  Depression (PHQ2-9): High Risk (03/03/2023)  Financial Resource Strain: Medium Risk (10/17/2023)  Physical Activity: Inactive (10/09/2023)  Social Connections: Moderately Isolated (10/09/2023)  Stress: Stress Concern Present (10/17/2023)  Tobacco Use: High Risk (11/23/2023)  Health Literacy: Adequate Health Literacy (10/17/2023)    Marit Lark, MSW, LCSW Clinical Social Worker II [facility] Navigation  [phone]- work cell phone (preferred)

## 2023-12-09 ENCOUNTER — Telehealth (HOSPITAL_BASED_OUTPATIENT_CLINIC_OR_DEPARTMENT_OTHER): Payer: Self-pay | Admitting: *Deleted

## 2023-12-09 DIAGNOSIS — I10 Essential (primary) hypertension: Secondary | ICD-10-CM

## 2023-12-09 MED ORDER — HYDRALAZINE HCL 100 MG PO TABS: 100.0000 mg | ORAL_TABLET | Freq: Three times a day (TID) | ORAL | 3 refills | Status: DC

## 2023-12-09 MED ORDER — ATORVASTATIN CALCIUM 80 MG PO TABS: 80.0000 mg | ORAL_TABLET | Freq: Every day | ORAL | 3 refills | Status: AC

## 2023-12-09 NOTE — Telephone Encounter (Signed)
 Message from Denise Carter, [place] sent at 12/08/2023  3:41 PM EST -----    Pt was able to get appt in December with [facility].  After completing call with pt noted that her carvedilol , atorvastatin  and hydralazine  haven't been filled since June/July and would have run out in October (think there are refills on file). Just as  an financial planner.   Above message received, reviewed dispense report  Left message to call back and mychart message sent to patient  Carvedilol  was increased so should have plenty if doubled her previous Rx Mychart message to patient regarding the Atorvastatin  and Hydralazine 

## 2023-12-09 NOTE — Telephone Encounter (Signed)
 Patient responded to mychart message, sent in new Rx's for Atorvastatin  and Hydralazine 

## 2024-01-09 ENCOUNTER — Encounter

## 2024-01-11 ENCOUNTER — Encounter

## 2024-01-11 ENCOUNTER — Encounter (HOSPITAL_BASED_OUTPATIENT_CLINIC_OR_DEPARTMENT_OTHER): Admitting: Cardiovascular Disease

## 2024-01-13 ENCOUNTER — Ambulatory Visit: Admitting: Pulmonary Disease

## 2024-01-13 VITALS — BP 156/108 | HR 98 | Temp 97.5°F | Ht 64.0 in | Wt 164.2 lb

## 2024-01-13 DIAGNOSIS — J432 Centrilobular emphysema: Secondary | ICD-10-CM

## 2024-01-13 DIAGNOSIS — I10 Essential (primary) hypertension: Secondary | ICD-10-CM

## 2024-01-13 DIAGNOSIS — J439 Emphysema, unspecified: Secondary | ICD-10-CM | POA: Diagnosis not present

## 2024-01-13 DIAGNOSIS — R053 Chronic cough: Secondary | ICD-10-CM

## 2024-01-13 DIAGNOSIS — Z87891 Personal history of nicotine dependence: Secondary | ICD-10-CM

## 2024-01-13 DIAGNOSIS — M069 Rheumatoid arthritis, unspecified: Secondary | ICD-10-CM

## 2024-01-13 DIAGNOSIS — F17210 Nicotine dependence, cigarettes, uncomplicated: Secondary | ICD-10-CM

## 2024-01-13 DIAGNOSIS — J441 Chronic obstructive pulmonary disease with (acute) exacerbation: Secondary | ICD-10-CM

## 2024-01-13 DIAGNOSIS — R0609 Other forms of dyspnea: Secondary | ICD-10-CM

## 2024-01-13 MED ORDER — AZITHROMYCIN 250 MG PO TABS: ORAL_TABLET | ORAL | 0 refills | Status: DC

## 2024-01-13 MED ORDER — PREDNISONE 20 MG PO TABS: 20.0000 mg | ORAL_TABLET | Freq: Every day | ORAL | 0 refills | Status: DC

## 2024-01-13 MED ORDER — ENSIFENTRINE 3 MG/2.5ML IN SUSP: 3.0000 mg | Freq: Two times a day (BID) | RESPIRATORY_TRACT | 1 refills | Status: AC

## 2024-01-13 NOTE — Progress Notes (Signed)
 "              Denise Carter    [id number]    [birth year]  Primary Care Physician:Wilson, Raguel, MD  Referring Physician: Tanda Raguel, MD [address] [place],  [place] [address]  Chief complaint:   Patient in for follow-up for chronic obstructive pulmonary disease Shortness of breath, coughing  HPI:  Continues to be short of breath Does not feel like the Breztri  is helping her much anymore  Did try Trelegy in the past that did not help as well  Continues to have difficulty with controlling her blood pressure  Follows up with cardiology   Cough, sputum production, easy fatigability - Yellow thick phlegm  She does work 5 days a week but sometimes with shortness of breath and the environment she works in, calls and intermittently  History of reflux, continues to take PPI  History of mild sleep apnea Lupus Chronic fatigue syndrome Polyneuropathy due to lupus Psoriatic arthritis Polyclonal gammopathy  Past sleep study did reveal mild obstructive sleep apnea  Most recent CT with lung nodules emphysema  She continues to work on quitting smoking  Outpatient Encounter Medications as of 01/13/2024  Medication Sig   albuterol  (VENTOLIN  HFA) 108 (90 Base) MCG/ACT inhaler Inhale 2 puffs into the lungs every 6 (six) hours as needed for wheezing or shortness of breath.   aspirin  (EC-81 ASPIRIN ) 81 MG EC tablet Take 1 tablet (81 mg total) by mouth daily. Swallow whole.   atorvastatin  (LIPITOR) 80 MG tablet Take 1 tablet (80 mg total) by mouth daily.   azithromycin  (ZITHROMAX  Z-PAK) 250 MG tablet Take 2 tablets day 1 and then 1 daily for 4 days   budesonide-glycopyrrolate-formoterol (BREZTRI  AEROSPHERE) 160-9-4.8 MCG/ACT AERO inhaler Inhale 2 puffs into the lungs every 12 (twelve) hours.   carvedilol  (COREG ) 25 MG tablet Take 1 tablet (25 mg total) by mouth 2 (two) times daily.   cloNIDine  (CATAPRES  - DOSED IN MG/24 HR) 0.2 mg/24hr patch Place 0.2 mg onto the  skin once a week.   Ensifentrine  3 MG/2.5ML SUSP Inhale 3 mg into the lungs every 12 (twelve) hours.   fluticasone  (FLONASE ) 50 MCG/ACT nasal spray Place 2 sprays into both nostrils daily.   hydrALAZINE  (APRESOLINE ) 100 MG tablet Take 1 tablet (100 mg total) by mouth 3 (three) times daily.   ibuprofen (ADVIL) 200 MG tablet Take 200 mg by mouth every 6 (six) hours as needed.   KLS OMPERAZOLE 20 MG TBEC Take 1 tablet by mouth daily.   mirtazapine  (REMERON ) 7.5 MG tablet Take 1 tablet (7.5 mg total) by mouth at bedtime.   naproxen  (NAPROSYN ) 500 MG tablet Take 1 tablet (500 mg total) by mouth 2 (two) times daily as needed for mild pain (pain score 1-3) or moderate pain (pain score 4-6). With meals   omeprazole  (PRILOSEC OTC) 20 MG tablet Take 20 mg by mouth daily.   pantoprazole  (PROTONIX ) 40 MG tablet Take 40 mg by mouth daily.   predniSONE  (DELTASONE ) 20 MG tablet Take 1 tablet (20 mg total) by mouth daily with breakfast.   pregabalin  (LYRICA ) 200 MG capsule Take 1 capsule (200 mg total) by mouth 2 (two) times daily.   Vitamin D , Ergocalciferol , (DRISDOL ) 1.25 MG (50000 UNIT) CAPS capsule Take 1 capsule (50,000 Units total) by mouth every 7 (seven) days.   No facility-administered encounter medications on file as of 01/13/2024.    Allergies as of 01/13/2024 - Review Complete 01/13/2024  Allergen Reaction Noted  Labetalol  Anxiety 10/25/2018    Past Medical History:  Diagnosis Date   Atypical chest pain 05/23/2019   Depression 06/28/2023   Essential hypertension 05/23/2019   Hypertension    Lupus    OSA (obstructive sleep apnea) 11/19/2019   Resistant hypertension 05/23/2019   Shortness of breath 05/23/2019   Snoring 05/23/2019   Thyroid  disease    Varicose veins of left lower extremity 01/26/2020    Past Surgical History:  Procedure Laterality Date   ABDOMINAL HYSTERECTOMY      Family History  Problem Relation Age of Onset   Hypertension Mother    Hypertension Sister     Hypertension Brother    Hypertension Brother    Hypertension Maternal Aunt    Breast cancer Maternal Aunt 40 - 49   Hypertension Maternal Grandmother    Lupus Maternal Grandmother    Heart failure Maternal Grandmother    Stroke Maternal Grandmother    CAD Maternal Grandmother    Adrenal disorder Neg Hx     Social History   Socioeconomic History   Marital status: Married    Spouse name: Not on file   Number of children: 7   Years of education: Not on file   Highest education level: GED or equivalent  Occupational History   Not on file  Tobacco Use   Smoking status: Every Day    Current packs/day: 1.00    Average packs/day: 1 pack/day for 33.0 years (33.0 ttl pk-yrs)    Types: Cigarettes    Start date: 01/26/1991   Smokeless tobacco: Never   Tobacco comments:    Patient is participating in health coaching for smoking cessation.   Vaping Use   Vaping status: Never Used  Substance and Sexual Activity   Alcohol use: No   Drug use: No   Sexual activity: Yes    Birth control/protection: None  Other Topics Concern   Not on file  Social History Narrative   Right handed      Highest level of edu- 12th grade      7 children      Apartment- 2nd floor   Social Drivers of Health   Tobacco Use: High Risk (01/13/2024)   Patient History    Smoking Tobacco Use: Every Day    Smokeless Tobacco Use: Never    Passive Exposure: Not on file  Financial Resource Strain: Medium Risk (10/17/2023)   Overall Financial Resource Strain (CARDIA)    Difficulty of Paying Living Expenses: Somewhat hard  Food Insecurity: Food Insecurity Present (10/17/2023)   Epic    Worried About Programme Researcher, Broadcasting/film/video in the Last Year: Sometimes true    Ran Out of Food in the Last Year: Never true  Transportation Needs: No Transportation Needs (10/17/2023)   Epic    Lack of Transportation (Medical): No    Lack of Transportation (Non-Medical): No  Physical Activity: Inactive (10/09/2023)   Exercise Vital Sign     Days of Exercise per Week: 0 days    Minutes of Exercise per Session: Not on file  Stress: Stress Concern Present (10/17/2023)   [employer] of Occupational Health - Occupational Stress Questionnaire    Feeling of Stress: Rather much  Social Connections: Moderately Isolated (10/09/2023)   Social Connection and Isolation Panel    Frequency of Communication with Friends and Family: Twice a week    Frequency of Social Gatherings with Friends and Family: Never    Attends Religious Services: 1 to 4 times per year  Active Member of Clubs or Organizations: No    Attends Banker Meetings: Not on file    Marital Status: Married  Intimate Partner Violence: Not At Risk (03/03/2023)   Humiliation, Afraid, Rape, and Kick questionnaire    Fear of Current or Ex-Partner: No    Emotionally Abused: No    Physically Abused: No    Sexually Abused: No  Depression (PHQ2-9): High Risk (03/03/2023)   Depression (PHQ2-9)    PHQ-2 Score: 13  Alcohol Screen: Low Risk (10/09/2023)   Alcohol Screen    Last Alcohol Screening Score (AUDIT): 0  Housing: High Risk (10/17/2023)   Epic    Unable to Pay for Housing in the Last Year: Yes    Number of Times Moved in the Last Year: 0    Homeless in the Last Year: No  Utilities: Not At Risk (10/17/2023)   Epic    Threatened with loss of utilities: No  Health Literacy: Adequate Health Literacy (10/17/2023)   B1300 Health Literacy    Frequency of need for help with medical instructions: Rarely    Review of Systems  Constitutional:  Positive for fatigue.  Respiratory:  Positive for cough, shortness of breath and wheezing.   Psychiatric/Behavioral:  Positive for sleep disturbance.     Vitals:   01/13/24 0959  BP: (!) 156/108  Pulse: 98  Temp: (!) 97.5 F (36.4 C)  SpO2: 99%     Physical Exam Constitutional:      Appearance: She is obese.  HENT:     Head: Normocephalic.     Mouth/Throat:     Mouth: Mucous membranes are moist.  Eyes:      General: No scleral icterus.    Pupils: Pupils are equal, round, and reactive to light.  Cardiovascular:     Rate and Rhythm: Normal rate and regular rhythm.     Heart sounds: No murmur heard.    No friction rub.  Pulmonary:     Effort: No respiratory distress.     Breath sounds: No stridor. Rhonchi present. No wheezing.  Musculoskeletal:        General: Normal range of motion.     Cervical back: No rigidity or tenderness.  Neurological:     General: No focal deficit present.     Mental Status: She is alert.  Psychiatric:        Mood and Affect: Mood normal.    Data Reviewed:   CT with 3 mm lung nodule, extensive emphysema  Last PFT reviewed showing mild obstructive disease, air trapping consistent with a history of emphysema, no significant bronchodilator response   Assessment:  Emphysema  COPD with exacerbation - Will continue Breztri  - We did discuss additional therapy which may help control symptoms better - Ensifentrine  was discussed  Most recent CBC showing eosinophils of 100, may not be the best candidate for Dupixent  History of lupus, rheumatoid arthritis - Encouraged to follow-up with rheumatology  Uncontrolled hypertension - Continues to follow with cardiology  Plan/Recommendations:  PFT is pending  Continue Breztri   Paperwork for Ensifentrine   Follow-up in about 3 months  Encouraged to continue to work on quitting smoking  Encouraged to give us  a call with any significant concerns  Prescription for azithromycin  and prednisone  called in for an exacerbation of COPD  I personally spent a total of 31 minutes in the care of the patient today including preparing to see the patient, getting/reviewing separately obtained history, performing a medically appropriate exam/evaluation, counseling and educating,  placing orders, documenting clinical information in the EHR, and independently interpreting results.  Jennet Epley MD [facility] 01/13/2024, 10:19 AM  CC: Tanda Bleacher, MD   "

## 2024-01-13 NOTE — Patient Instructions (Signed)
 I will see you back in about 3 months  We will place an order for Ensifentrine  -This is out on therapy for people with COPD were not well-controlled on current medications  I will place a prescription for azithromycin  and prednisone  for your exacerbation with a cough and congestion  Continue using your Breztri   Call us  with significant concerns

## 2024-01-17 ENCOUNTER — Encounter

## 2024-01-23 ENCOUNTER — Encounter (HOSPITAL_BASED_OUTPATIENT_CLINIC_OR_DEPARTMENT_OTHER): Payer: Self-pay

## 2024-01-23 ENCOUNTER — Ambulatory Visit: Payer: Self-pay | Admitting: Pulmonary Disease

## 2024-01-23 ENCOUNTER — Ambulatory Visit (HOSPITAL_BASED_OUTPATIENT_CLINIC_OR_DEPARTMENT_OTHER)

## 2024-01-23 VITALS — BP 115/70 | HR 119 | Ht 64.0 in | Wt 169.0 lb

## 2024-01-23 DIAGNOSIS — R053 Chronic cough: Secondary | ICD-10-CM

## 2024-01-23 DIAGNOSIS — F17210 Nicotine dependence, cigarettes, uncomplicated: Secondary | ICD-10-CM

## 2024-01-23 DIAGNOSIS — J432 Centrilobular emphysema: Secondary | ICD-10-CM

## 2024-01-23 DIAGNOSIS — Z87891 Personal history of nicotine dependence: Secondary | ICD-10-CM

## 2024-01-23 MED ORDER — CORICIDIN HBP FLU 15-500-2 MG PO TABS: 2.0000 | ORAL_TABLET | Freq: Four times a day (QID) | ORAL | 0 refills | Status: AC | PRN

## 2024-01-23 NOTE — Assessment & Plan Note (Signed)
" °  Assessment and Plan Assessment & Plan Centrilobular emphysema Chronic cough with congestion and post-nasal drip, persistent after a course of prednisone  and azithromycin . Current treatment includes Breztri  and albuterol . Nebulizer (Ohtuvayre ) pending. Dupixent not initiated due to eosinophil criteria. Blood pressure limits decongestant options. - Referred to pharmacy team for nebulizer (Ohtuvayre ) receipt. - Continue Breztri  two puffs twice daily. - Continue albuterol  as needed for shortness of breath. - Ordered Coricidin for congestion management in the setting of HTN. - Advised rest and fluid intake. - Advised smoking cessation.  "

## 2024-01-23 NOTE — Patient Instructions (Signed)
 Continue Breztri  twice daily and Albuterol  every 4-6 hours as needed.  Rx sent for Coricidin HBP to help with congestion.  May take this every 4-6 hours.  Start Ohtuvayre :  referral to Pharmacy sent.  Rest, increase fluids.  Call for sooner follow up if change in secretions, fevers, chills, new or worsening symptoms.    Follow up with Dr. Neda as scheduled.

## 2024-01-23 NOTE — Telephone Encounter (Signed)
 FYI Only or Action Required?: FYI only for provider: appointment scheduled on 01/23/24.  Patient is followed in Pulmonology for COPD and OSA, last seen on 01/13/2024 by Neda Jennet LABOR, MD.  Called Nurse Triage reporting Cough.  Symptoms began yesterday.  Interventions attempted: OTC medications: Theraflu, Vicks, nyquil, Mucinex, Prescription medications: azithromycin  and prednisone , Rescue inhaler, and Maintenance inhaler. Beztri and albuterol  inhaler  Symptoms are: gradually worsening.  Triage Disposition: See HCP Within 4 Hours (Or PCP Triage)  Patient/caregiver understands and will follow disposition?: Yes    Seen 12/19 by Dr. Neda for cough/COPD exacerbation, prescribed azithromycin  and prednisone . Course completed and cough resolved. Yesterday return of moderate to severe dry cough like a smokers cough per pt with mild SOB at rest, increased from baseline. Reporting increased BP as a result. BP currently 162/113, HR 111. Reports DBP always in the 100's and working on this with her PCP. Denies CP, numbness, tingling, weakness, increased difficulty walking or changes to speech or vision. Takes hydralazine , clonidine  patch (patch currently on), and carvedilol . Has not taken hydralazine  or carvedilol  yet this morning, advised to take. Speaking in clear full coherent continuous sentences, no audible SOB heard over the phone. Scheduled appt with provider at different pulm office in pt region d/t no availability at home office with any provider within timeframe. Advised to call back for for worsening symptoms.     [facility] Pulmonary Triage - Initial Assessment Questions Chief Complaint (e.g., cough, sob, wheezing, fever, chills, sweat or additional symptoms) *Go to specific symptom protocol after initial questions. Cough, BP 162/111 this morning. 157/102 the day before when cough was not present, baseline per pt. Reports bottom number is always high and is working on it with her  PCP.  How long have symptoms been present? Saw Dr. Neda on 12/19 for cough. Abxs and steroids cleared up cough, return of cough yesterday.  Have you tested for COVID or Flu? Note: If not, ask patient if a home test can be taken. If so, instruct patient to call back for positive results. No  MEDICINES:   Have you used any OTC meds to help with symptoms? Yes If yes, ask What medications? Theraflu, Vicks, nyquil, Mucinex  Have you used your inhalers/maintenance medication? Yes If yes, What medications? Beztri and albuterol   If inhaler, ask How many puffs and how often? Note: Review instructions on medication in the chart. Albuterol  once yesterday and today, 2 puffs  OXYGEN: Do you wear supplemental oxygen? No If yes, How many liters are you supposed to use? Denies  Do you monitor your oxygen levels? No If yes, What is your reading (oxygen level) today? Does not monitor  What is your usual oxygen saturation reading?  (Note: Pulmonary O2 sats should be 90% or greater) Does not monitor     Copied from CRM #[id number]. Topic: Clinical - Red Word Triage >> Jan 23, 2024  9:10 AM Russell PARAS wrote: [facility] that prompted transfer to Nurse Triage:   Severe cough started 2 weeks ago Evaluated and prescribed antibiotic and prednisone  Cough has gotten worse Nasal congestion SOB Cough causing headache and elevating blood pressure  Pt of [place] Reason for Disposition  [1] MILD difficulty breathing (e.g., minimal/no SOB at rest, SOB with walking, pulse < 100) AND [2] still present when not coughing  Answer Assessment - Initial Assessment Questions 1. ONSET: When did the cough begin?      Yesterday  2. SEVERITY: How bad is the cough today?  Moderate to severe  3. SPUTUM: Describe the color of your sputum (e.g., none, dry cough; clear, white, yellow, green)     Dry  4. HEMOPTYSIS: Are you coughing up any blood? If Yes, ask: How much? (e.g.,  flecks, streaks, tablespoons, etc.)     Denies  5. DIFFICULTY BREATHING: Are you having difficulty breathing? If Yes, ask: How bad is it? (e.g., mild, moderate, severe)      Mild  6. FEVER: Do you have a fever? If Yes, ask: What is your temperature, how was it measured, and when did it start?     Denies  7. CARDIAC HISTORY: Do you have any history of heart disease? (e.g., heart attack, congestive heart failure)      Denies  8. LUNG HISTORY: Do you have any history of lung disease?  (e.g., pulmonary embolus, asthma, emphysema)     COPD and OSA  9. PE RISK FACTORS: Do you have a history of blood clots? (or: recent major surgery, recent prolonged travel, bedridden)     Denies  10. OTHER SYMPTOMS: Do you have any other symptoms? (e.g., runny nose, wheezing, chest pain)       Congestion, headache, increased BP  Protocols used: Cough - Acute Non-Productive-A-AH

## 2024-01-23 NOTE — Progress Notes (Signed)
 "  @Patient  ID: Denise Carter, female    DOB: 18-Jun-1970, 53 y.o.   MRN: [id number]  Chief Complaint  Patient presents with   Acute Visit    Referring provider: Tanda Bleacher, MD  HPI: Discussed the use of AI scribe software for clinical note transcription with the patient, who gave verbal consent to proceed.  History of Present Illness Denise Carter is a 53 year old female who presents with persistent cough and congestion.   She has been experiencing a persistent cough that began approximately a week before her visit on December 19th. Initially, the cough improved with a course of prednisone  and antibiotics but returned after completing the medications. The cough is severe, leading to choking and incontinence, and worsens when lying down. The cough is dry and no longer productive.  She currently uses Breztri , two puffs twice daily, and albuterol  as needed, which she has been using more frequently, about three times a day, due to shortness of breath. She was supposed to receive a nebulizer but has not yet obtained it.  She experiences significant congestion, particularly in her nose and throat, with a sensation of post-nasal drip, but is unable to expel any mucus. This congestion is accompanied by a dry mouth, which she attributes to Breztri .  The cough and congestion have impacted her ability to work, causing her to take time off due to the severity of her symptoms. She also reports a headache and elevated blood pressure associated with the coughing episodes.  She has tried various over-the-counter medications, including Mucinex, Delsym, and Vicks, without relief.   No fever or productive cough. Reports dry mouth, headache, and shortness of breath.  She has not started Ohtuvayre  yet as ordered at her last visit.  Last OV 01/13/2024: Continues to be short of breath Does not feel like the Breztri  is helping her much anymore   Did try Trelegy in the past that did not help as  well   Continues to have difficulty with controlling her blood pressure   Follows up with cardiology     Cough, sputum production, easy fatigability - Yellow thick phlegm   She does work 5 days a week but sometimes with shortness of breath and the environment she works in, calls and intermittently   History of reflux, continues to take PPI   History of mild sleep apnea Lupus Chronic fatigue syndrome Polyneuropathy due to lupus Psoriatic arthritis Polyclonal gammopathy   Past sleep study did reveal mild obstructive sleep apnea   Most recent CT with lung nodules emphysema   She continues to work on quitting smoking   TEST/EVENTS :   Allergies[1]  Immunization History  Administered Date(s) Administered   Influenza, Quadrivalent, Recombinant, Inj, Pf 10/22/2015   Influenza,inj,Quad PF,6+ Mos 11/08/2013, 10/25/2018, 02/06/2021, 09/29/2021   PFIZER(Purple Top)SARS-COV-2 Vaccination 04/13/2019, 05/09/2019, 11/24/2019   Pneumococcal Conjugate-13 11/04/2014   Pneumococcal Polysaccharide-23 11/08/2013, 02/06/2021   Tdap 03/03/2023   Zoster Recombinant(Shingrix ) 09/29/2021, 12/03/2021    Past Medical History:  Diagnosis Date   Atypical chest pain 05/23/2019   Depression 06/28/2023   Essential hypertension 05/23/2019   Hypertension    Lupus    OSA (obstructive sleep apnea) 11/19/2019   Resistant hypertension 05/23/2019   Shortness of breath 05/23/2019   Snoring 05/23/2019   Thyroid  disease    Varicose veins of left lower extremity 01/26/2020    Tobacco History: Tobacco Use History[2] Ready to quit: Not Answered Counseling given: Not Answered Tobacco comments: Patient is participating in health coaching  for smoking cessation.    Outpatient Medications Prior to Visit  Medication Sig Dispense Refill   albuterol  (VENTOLIN  HFA) 108 (90 Base) MCG/ACT inhaler Inhale 2 puffs into the lungs every 6 (six) hours as needed for wheezing or shortness of breath. 8.5 g 2    aspirin  (EC-81 ASPIRIN ) 81 MG EC tablet Take 1 tablet (81 mg total) by mouth daily. Swallow whole. 30 tablet 12   atorvastatin  (LIPITOR) 80 MG tablet Take 1 tablet (80 mg total) by mouth daily. 90 tablet 3   budesonide-glycopyrrolate-formoterol (BREZTRI  AEROSPHERE) 160-9-4.8 MCG/ACT AERO inhaler Inhale 2 puffs into the lungs every 12 (twelve) hours. 10.7 g 3   carvedilol  (COREG ) 25 MG tablet Take 1 tablet (25 mg total) by mouth 2 (two) times daily. 180 tablet 3   cloNIDine  (CATAPRES  - DOSED IN MG/24 HR) 0.2 mg/24hr patch Place 0.2 mg onto the skin once a week.     Ensifentrine  3 MG/2.5ML SUSP Inhale 3 mg into the lungs every 12 (twelve) hours. 150 mL 1   fluticasone  (FLONASE ) 50 MCG/ACT nasal spray Place 2 sprays into both nostrils daily. 91 g 1   ibuprofen (ADVIL) 200 MG tablet Take 200 mg by mouth every 6 (six) hours as needed.     KLS OMPERAZOLE 20 MG TBEC Take 1 tablet by mouth daily.     omeprazole  (PRILOSEC OTC) 20 MG tablet Take 20 mg by mouth daily.     pantoprazole  (PROTONIX ) 40 MG tablet Take 40 mg by mouth daily.     pregabalin  (LYRICA ) 200 MG capsule Take 1 capsule (200 mg total) by mouth 2 (two) times daily. 60 capsule 3   Vitamin D , Ergocalciferol , (DRISDOL ) 1.25 MG (50000 UNIT) CAPS capsule Take 1 capsule (50,000 Units total) by mouth every 7 (seven) days. 5 capsule 2   azithromycin  (ZITHROMAX  Z-PAK) 250 MG tablet Take 2 tablets day 1 and then 1 daily for 4 days 6 each 0   hydrALAZINE  (APRESOLINE ) 100 MG tablet Take 1 tablet (100 mg total) by mouth 3 (three) times daily. 270 tablet 3   mirtazapine  (REMERON ) 7.5 MG tablet Take 1 tablet (7.5 mg total) by mouth at bedtime. 30 tablet 1   naproxen  (NAPROSYN ) 500 MG tablet Take 1 tablet (500 mg total) by mouth 2 (two) times daily as needed for mild pain (pain score 1-3) or moderate pain (pain score 4-6). With meals 60 tablet 1   predniSONE  (DELTASONE ) 20 MG tablet Take 1 tablet (20 mg total) by mouth daily with breakfast. 7 tablet 0   No  facility-administered medications prior to visit.     Review of Systems: as per hpi  Constitutional:   No  weight loss, night sweats,  Fevers, chills, fatigue, or  lassitude.  HEENT:   No headaches,  Difficulty swallowing,  Tooth/dental problems, or  Sore throat,                No sneezing, itching, ear ache, nasal congestion, post nasal drip,   CV:  No chest pain,  Orthopnea, PND, swelling in lower extremities, anasarca, dizziness, palpitations, syncope.   GI  No heartburn, indigestion, abdominal pain, nausea, vomiting, diarrhea, change in bowel habits, loss of appetite, bloody stools.   Resp: No shortness of breath with exertion or at rest.  No excess mucus, no productive cough,  No non-productive cough,  No coughing up of blood.  No change in color of mucus.  No wheezing.  No chest wall deformity  Skin: no rash or lesions.  GU: no dysuria, change in color of urine, no urgency or frequency.  No flank pain, no hematuria   MS:  No joint pain or swelling.  No decreased range of motion.  No back pain.    Physical Exam  BP 115/70   Pulse (!) 119   Ht 5' 4 (1.626 m)   Wt 169 lb (76.7 kg)   SpO2 96%   BMI 29.01 kg/m   GEN: A/Ox3; pleasant , NAD, well nourished    HEENT:  [place]/AT,  EACs-clear, TMs-wnl, NOSE-congested, THROAT-clear, no lesions, no postnasal drip or exudate noted.   NECK:  Supple w/ fair ROM; no JVD; normal carotid impulses w/o bruits; no thyromegaly or nodules palpated; no lymphadenopathy.    RESP  Clear  P & A; w/o, wheezes/ rales/ or rhonchi. no accessory muscle use, no dullness to percussion  CARD:  RRR, no m/r/g, no peripheral edema, pulses intact, no cyanosis or clubbing.  GI:   Soft & nt; nml bowel sounds; no organomegaly or masses detected.   Musco: Warm bil, no deformities or joint swelling noted.   Neuro: alert, no focal deficits noted.    Skin: Warm, no lesions or rashes    Lab Results:  CBC    Component Value Date/Time   WBC 7.6  10/12/2023 1008   WBC 8.9 07/30/2023 1358   RBC 4.87 10/12/2023 1008   RBC 5.04 07/30/2023 1358   HGB 13.5 10/12/2023 1008   HCT 44.2 10/12/2023 1008   PLT 195 10/12/2023 1008   MCV 91 10/12/2023 1008   MCH 27.7 10/12/2023 1008   MCH 28.8 07/30/2023 1358   MCHC 30.5 (L) 10/12/2023 1008   MCHC 33.3 07/30/2023 1358   RDW 13.9 10/12/2023 1008   LYMPHSABS 1.9 10/12/2023 1008   MONOABS 0.5 07/27/2023 1618   EOSABS 0.1 10/12/2023 1008   BASOSABS 0.0 10/12/2023 1008    BMET    Component Value Date/Time   NA 141 10/12/2023 1008   K 3.6 10/12/2023 1008   CL 100 10/12/2023 1008   CO2 25 10/12/2023 1008   GLUCOSE 73 10/12/2023 1008   GLUCOSE 103 (H) 07/30/2023 1358   BUN 6 10/12/2023 1008   CREATININE 0.80 10/12/2023 1008   CALCIUM  9.4 10/12/2023 1008   GFRNONAA >60 07/30/2023 1358   GFRAA 87 01/23/2020 1429    BNP No results found for: BNP  ProBNP No results found for: PROBNP  Imaging: No results found.  Administration History     None          Latest Ref Rng & Units 08/14/2021   11:56 AM  PFT Results  FVC-Pre L 2.16   FVC-Predicted Pre % 59   FVC-Post L 2.36   FVC-Predicted Post % 65   Pre FEV1/FVC % % 82   Post FEV1/FCV % % 84   FEV1-Pre L 1.78   FEV1-Predicted Pre % 62   FEV1-Post L 1.99   DLCO uncorrected ml/min/mmHg 18.24   DLCO UNC% % 84   DLCO corrected ml/min/mmHg 18.24   DLCO COR %Predicted % 84   DLVA Predicted % 112   TLC L 4.68   TLC % Predicted % 89   RV % Predicted % 122     No results found for: NITRICOXIDE   Assessment & Plan:   Assessment & Plan Centrilobular emphysema (HCC)  Chronic cough  Smoking hx  Assessment and Plan Assessment & Plan Centrilobular emphysema Chronic cough with congestion and post-nasal drip, persistent after a course of prednisone   and azithromycin . Current treatment includes Breztri  and albuterol . Nebulizer (Ohtuvayre ) pending. Dupixent not initiated due to eosinophil criteria. Blood pressure  limits decongestant options. - Referred to pharmacy team for nebulizer (Ohtuvayre ) receipt. - Continue Breztri  two puffs twice daily. - Continue albuterol  as needed for shortness of breath. - Ordered Coricidin for congestion management in the setting of HTN. - Advised rest and fluid intake. - Advised smoking cessation.    Return for w/ Dr. Neda as scheduled.Denise Candis Dandy, PA-C 01/23/2024      [1]  Allergies Allergen Reactions   Labetalol  Anxiety    Headaches Headaches  [2]  Social History Tobacco Use  Smoking Status Every Day   Current packs/day: 1.00   Average packs/day: 1 pack/day for 33.0 years (33.0 ttl pk-yrs)   Types: Cigarettes   Start date: 01/26/1991  Smokeless Tobacco Never  Tobacco Comments   Patient is participating in health coaching for smoking cessation.    "

## 2024-01-23 NOTE — Telephone Encounter (Signed)
 Appt 12/29 with Candis, [place]

## 2024-01-24 ENCOUNTER — Telehealth: Payer: Self-pay

## 2024-01-24 NOTE — Telephone Encounter (Signed)
 Received Ohtuvayre  new start paperwork. Completed form and faxed with clinicals and insurance card copy to [facility]   Phone#: [phone] Fax#: [phone]

## 2024-01-25 NOTE — Telephone Encounter (Signed)
 Received fax from [employer] confirming receipt of enrollment form.  Patient ID: [id number]

## 2024-01-27 NOTE — Telephone Encounter (Signed)
 Received fax from [employer] with summary of benefits. Referral form for Ohtuvayre  received. Rx will be triaged to [facility] (phone: [phone]). Once benefits investigation completed, pharmacy will reach out the patient to schedule shipment. If medication is unaffordable, patient will need to express financial hardship to be referred back to [facility] for patient assistance program pre-screening.   Patient ID: [id number] Pharmacy phone: [facility] (phone: [phone]) [facility] Phone#: [phone]

## 2024-02-14 ENCOUNTER — Encounter (HOSPITAL_BASED_OUTPATIENT_CLINIC_OR_DEPARTMENT_OTHER): Payer: Self-pay | Admitting: Cardiovascular Disease

## 2024-02-17 NOTE — Telephone Encounter (Signed)
 Rx for Ohtuvayre  transferred to [facility].  Phone: [phone]  Sherry Pennant, PharmD, MPH, BCPS, CPP Clinical Pharmacist

## 2024-03-28 ENCOUNTER — Encounter (HOSPITAL_BASED_OUTPATIENT_CLINIC_OR_DEPARTMENT_OTHER): Admitting: Cardiovascular Disease

## 2024-05-01 ENCOUNTER — Encounter

## 2024-05-01 ENCOUNTER — Ambulatory Visit: Admitting: Pulmonary Disease

## 2024-05-02 ENCOUNTER — Ambulatory Visit: Admitting: Pulmonary Disease
